# Patient Record
Sex: Female | Born: 1950 | Race: White | Hispanic: No | Marital: Married | Smoking: Current every day smoker
Health system: Southern US, Community
[De-identification: ages and names within clinical notes are randomized; demographics above are authoritative.]

## PROBLEM LIST (undated history)

## (undated) DIAGNOSIS — I10 Essential (primary) hypertension: Secondary | ICD-10-CM

## (undated) DIAGNOSIS — F1010 Alcohol abuse, uncomplicated: Secondary | ICD-10-CM

## (undated) DIAGNOSIS — E871 Hypo-osmolality and hyponatremia: Secondary | ICD-10-CM

## (undated) HISTORY — PX: ABDOMINAL HYSTERECTOMY: SHX81

---
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
## 2010-10-18 ENCOUNTER — Ambulatory Visit: Payer: Self-pay | Admitting: Internal Medicine

## 2011-04-02 ENCOUNTER — Ambulatory Visit: Payer: Self-pay | Admitting: Family Medicine

## 2012-02-25 ENCOUNTER — Ambulatory Visit: Payer: Self-pay | Admitting: Family Medicine

## 2019-01-26 ENCOUNTER — Other Ambulatory Visit: Payer: Self-pay

## 2019-01-26 ENCOUNTER — Inpatient Hospital Stay (HOSPITAL_COMMUNITY)
Admission: EM | Admit: 2019-01-26 | Discharge: 2019-01-30 | DRG: 641 | Disposition: A | Discharge status: Home / Self Care | Payer: Medicare Other | Source: Other Acute Inpatient Hospital | Attending: Internal Medicine | Admitting: Internal Medicine

## 2019-01-26 ENCOUNTER — Encounter: Payer: Self-pay | Admitting: Emergency Medicine

## 2019-01-26 ENCOUNTER — Inpatient Hospital Stay (HOSPITAL_COMMUNITY): Payer: Medicare Other

## 2019-01-26 DIAGNOSIS — F1010 Alcohol abuse, uncomplicated: Secondary | ICD-10-CM | POA: Diagnosis present

## 2019-01-26 DIAGNOSIS — E871 Hypo-osmolality and hyponatremia: Secondary | ICD-10-CM | POA: Diagnosis not present

## 2019-01-26 DIAGNOSIS — Z716 Tobacco abuse counseling: Secondary | ICD-10-CM

## 2019-01-26 DIAGNOSIS — I10 Essential (primary) hypertension: Secondary | ICD-10-CM | POA: Diagnosis present

## 2019-01-26 DIAGNOSIS — F17210 Nicotine dependence, cigarettes, uncomplicated: Secondary | ICD-10-CM | POA: Diagnosis not present

## 2019-01-26 DIAGNOSIS — F1020 Alcohol dependence, uncomplicated: Secondary | ICD-10-CM | POA: Diagnosis present

## 2019-01-26 DIAGNOSIS — Z885 Allergy status to narcotic agent status: Secondary | ICD-10-CM | POA: Diagnosis not present

## 2019-01-26 DIAGNOSIS — Z881 Allergy status to other antibiotic agents status: Secondary | ICD-10-CM | POA: Diagnosis not present

## 2019-01-26 DIAGNOSIS — Z20828 Contact with and (suspected) exposure to other viral communicable diseases: Secondary | ICD-10-CM | POA: Diagnosis present

## 2019-01-26 DIAGNOSIS — Z791 Long term (current) use of non-steroidal anti-inflammatories (NSAID): Secondary | ICD-10-CM | POA: Diagnosis not present

## 2019-01-26 DIAGNOSIS — R05 Cough: Secondary | ICD-10-CM | POA: Diagnosis present

## 2019-01-26 DIAGNOSIS — Z79899 Other long term (current) drug therapy: Secondary | ICD-10-CM | POA: Diagnosis not present

## 2019-01-26 DIAGNOSIS — G8929 Other chronic pain: Secondary | ICD-10-CM | POA: Diagnosis present

## 2019-01-26 DIAGNOSIS — F329 Major depressive disorder, single episode, unspecified: Secondary | ICD-10-CM | POA: Diagnosis present

## 2019-01-26 DIAGNOSIS — F419 Anxiety disorder, unspecified: Secondary | ICD-10-CM | POA: Diagnosis present

## 2019-01-26 DIAGNOSIS — R059 Cough, unspecified: Secondary | ICD-10-CM

## 2019-01-26 DIAGNOSIS — R531 Weakness: Secondary | ICD-10-CM | POA: Diagnosis present

## 2019-01-26 DIAGNOSIS — Z720 Tobacco use: Secondary | ICD-10-CM | POA: Diagnosis not present

## 2019-01-26 HISTORY — DX: Alcohol abuse, uncomplicated: F10.10

## 2019-01-26 HISTORY — DX: Hypo-osmolality and hyponatremia: E87.1

## 2019-01-26 HISTORY — DX: Essential (primary) hypertension: I10

## 2019-01-26 LAB — BASIC METABOLIC PANEL
Anion gap: 12 (ref 5–15)
BUN: 20 mg/dL (ref 8–23)
CO2: 28 mmol/L (ref 22–32)
Calcium: 9.9 mg/dL (ref 8.9–10.3)
Chloride: 85 mmol/L — ABNORMAL LOW (ref 98–111)
Creatinine, Ser: 0.69 mg/dL (ref 0.44–1.00)
GFR calc Af Amer: >60 mL/min (ref >60)
GFR calc non Af Amer: >60 mL/min (ref >60)
Glucose, Bld: 84 mg/dL (ref 70–99)
Potassium: 3.7 mmol/L (ref 3.5–5.1)
Sodium: 125 mmol/L — ABNORMAL LOW (ref 135–145)

## 2019-01-26 LAB — COMPREHENSIVE METABOLIC PANEL
ALT: 18 U/L (ref 0–44)
AST: 34 U/L (ref 15–41)
Albumin: 4.2 g/dL (ref 3.5–5.0)
Alkaline Phosphatase: 60 U/L (ref 38–126)
Anion gap: 13 (ref 5–15)
BUN: 21 mg/dL (ref 8–23)
CO2: 28 mmol/L (ref 22–32)
Calcium: 10.6 mg/dL — ABNORMAL HIGH (ref 8.9–10.3)
Chloride: 83 mmol/L — ABNORMAL LOW (ref 98–111)
Creatinine, Ser: 0.76 mg/dL (ref 0.44–1.00)
GFR calc Af Amer: >60 mL/min (ref >60)
GFR calc non Af Amer: >60 mL/min (ref >60)
Glucose, Bld: 72 mg/dL (ref 70–99)
Potassium: 3.9 mmol/L (ref 3.5–5.1)
Sodium: 124 mmol/L — ABNORMAL LOW (ref 135–145)
Total Bilirubin: 0.6 mg/dL (ref 0.3–1.2)
Total Protein: 7.2 g/dL (ref 6.5–8.1)

## 2019-01-26 LAB — CBC WITH DIFFERENTIAL/PLATELET
Abs Immature Granulocytes: 0.03 10*3/uL (ref 0.00–0.07)
Basophils Absolute: 0.1 10*3/uL (ref 0.0–0.1)
Basophils Relative: 1 %
Eosinophils Absolute: 0.2 10*3/uL (ref 0.0–0.5)
Eosinophils Relative: 2 %
HCT: 40.6 % (ref 36.0–46.0)
Hemoglobin: 14.8 g/dL (ref 12.0–15.0)
Immature Granulocytes: 0 %
Lymphocytes Relative: 17 %
Lymphs Abs: 1.3 10*3/uL (ref 0.7–4.0)
MCH: 36 pg — ABNORMAL HIGH (ref 26.0–34.0)
MCHC: 36.5 g/dL — ABNORMAL HIGH (ref 30.0–36.0)
MCV: 98.8 fL (ref 80.0–100.0)
Monocytes Absolute: 0.9 10*3/uL (ref 0.1–1.0)
Monocytes Relative: 12 %
Neutro Abs: 4.9 10*3/uL (ref 1.7–7.7)
Neutrophils Relative %: 68 %
Platelets: 252 10*3/uL (ref 150–400)
RBC: 4.11 MIL/uL (ref 3.87–5.11)
RDW: 12.2 % (ref 11.5–15.5)
WBC: 7.4 10*3/uL (ref 4.0–10.5)
nRBC: 0 % (ref 0.0–0.2)

## 2019-01-26 LAB — URINALYSIS, ROUTINE W REFLEX MICROSCOPIC
Bilirubin Urine: NEGATIVE
Glucose, UA: NEGATIVE mg/dL
Ketones, ur: NEGATIVE mg/dL
Leukocytes,Ua: NEGATIVE
Nitrite: NEGATIVE
Protein, ur: NEGATIVE mg/dL
Specific Gravity, Urine: 1.006 (ref 1.005–1.030)
pH: 7 (ref 5.0–8.0)

## 2019-01-26 LAB — SODIUM, URINE, RANDOM: Sodium, Ur: 61 mmol/L

## 2019-01-26 MED ORDER — HYDRALAZINE HCL 20 MG/ML IJ SOLN
5.0000 mg | Freq: Four times a day (QID) | INTRAMUSCULAR | Status: DC | PRN
  Administered 2019-01-29 (×2): 5 mg via INTRAVENOUS
  Filled 2019-01-26 (×2): qty 1

## 2019-01-26 MED ORDER — DICLOFENAC SODIUM 75 MG PO TBEC
75.0000 mg | DELAYED_RELEASE_TABLET | Freq: Two times a day (BID) | ORAL | Status: DC
  Administered 2019-01-27 – 2019-01-30 (×7): 75 mg via ORAL
  Filled 2019-01-26 (×9): qty 1

## 2019-01-26 MED ORDER — THIAMINE HCL 100 MG/ML IJ SOLN: 100.0000 mg | Freq: Every day | INTRAMUSCULAR | Status: DC

## 2019-01-26 MED ORDER — LORAZEPAM 2 MG/ML IJ SOLN
0.0000 mg | Freq: Two times a day (BID) | INTRAMUSCULAR | Status: DC
  Administered 2019-01-30: 1 mg via INTRAVENOUS
  Filled 2019-01-26: qty 1

## 2019-01-26 MED ORDER — TRAZODONE HCL 100 MG PO TABS
100.0000 mg | ORAL_TABLET | Freq: Every day | ORAL | Status: DC
  Administered 2019-01-26 – 2019-01-29 (×4): 100 mg via ORAL
  Filled 2019-01-26 (×4): qty 1

## 2019-01-26 MED ORDER — CHLORDIAZEPOXIDE HCL 25 MG PO CAPS
50.0000 mg | ORAL_CAPSULE | Freq: Once | ORAL | Status: AC
  Administered 2019-01-26: 50 mg via ORAL
  Filled 2019-01-26: qty 2

## 2019-01-26 MED ORDER — ACETAMINOPHEN 325 MG PO TABS
650.0000 mg | ORAL_TABLET | Freq: Four times a day (QID) | ORAL | Status: DC | PRN
  Administered 2019-01-27 – 2019-01-30 (×7): 650 mg via ORAL
  Filled 2019-01-26 (×7): qty 2

## 2019-01-26 MED ORDER — ENSURE ENLIVE PO LIQD
237.0000 mL | Freq: Two times a day (BID) | ORAL | Status: DC
  Administered 2019-01-27 – 2019-01-30 (×3): 237 mL via ORAL

## 2019-01-26 MED ORDER — ADULT MULTIVITAMIN W/MINERALS CH
1.0000 | ORAL_TABLET | Freq: Every day | ORAL | Status: DC
  Administered 2019-01-27 – 2019-01-30 (×4): 1 via ORAL
  Filled 2019-01-26 (×4): qty 1

## 2019-01-26 MED ORDER — CHLORDIAZEPOXIDE HCL 25 MG PO CAPS
25.0000 mg | ORAL_CAPSULE | Freq: Once | ORAL | Status: AC
  Administered 2019-01-27: 25 mg via ORAL
  Filled 2019-01-26: qty 1

## 2019-01-26 MED ORDER — ENOXAPARIN SODIUM 40 MG/0.4ML ~~LOC~~ SOLN
40.0000 mg | SUBCUTANEOUS | Status: DC
  Administered 2019-01-26 – 2019-01-29 (×4): 40 mg via SUBCUTANEOUS
  Filled 2019-01-26 (×4): qty 0.4

## 2019-01-26 MED ORDER — FOLIC ACID 1 MG PO TABS
1.0000 mg | ORAL_TABLET | Freq: Every day | ORAL | Status: DC
  Administered 2019-01-27 – 2019-01-30 (×4): 1 mg via ORAL
  Filled 2019-01-26 (×4): qty 1

## 2019-01-26 MED ORDER — LORAZEPAM 2 MG/ML IJ SOLN: 1.0000 mg | Freq: Four times a day (QID) | INTRAMUSCULAR | Status: AC | PRN

## 2019-01-26 MED ORDER — LORAZEPAM 2 MG/ML IJ SOLN: 0.0000 mg | Freq: Four times a day (QID) | INTRAMUSCULAR | Status: AC

## 2019-01-26 MED ORDER — VITAMIN B-1 100 MG PO TABS
100.0000 mg | ORAL_TABLET | Freq: Every day | ORAL | Status: DC
  Administered 2019-01-27 – 2019-01-30 (×4): 100 mg via ORAL
  Filled 2019-01-26 (×4): qty 1

## 2019-01-26 MED ORDER — GABAPENTIN 300 MG PO CAPS
600.0000 mg | ORAL_CAPSULE | Freq: Two times a day (BID) | ORAL | Status: DC
  Administered 2019-01-26 – 2019-01-30 (×8): 600 mg via ORAL
  Filled 2019-01-26 (×8): qty 2

## 2019-01-26 MED ORDER — ACETAMINOPHEN 650 MG RE SUPP: 650.0000 mg | Freq: Four times a day (QID) | RECTAL | Status: DC | PRN

## 2019-01-26 MED ORDER — LORAZEPAM 1 MG PO TABS
1.0000 mg | ORAL_TABLET | Freq: Four times a day (QID) | ORAL | Status: AC | PRN
  Administered 2019-01-28 – 2019-01-29 (×2): 1 mg via ORAL
  Filled 2019-01-26 (×2): qty 1

## 2019-01-26 MED ORDER — NICOTINE 14 MG/24HR TD PT24
14.0000 mg | MEDICATED_PATCH | TRANSDERMAL | Status: DC
  Administered 2019-01-26 – 2019-01-29 (×4): 14 mg via TRANSDERMAL
  Filled 2019-01-26 (×5): qty 1

## 2019-01-26 MED ORDER — LISINOPRIL 20 MG PO TABS
40.0000 mg | ORAL_TABLET | Freq: Every day | ORAL | Status: DC
  Administered 2019-01-26 – 2019-01-30 (×5): 40 mg via ORAL
  Filled 2019-01-26 (×5): qty 2

## 2019-01-26 MED ORDER — SERTRALINE HCL 100 MG PO TABS
100.0000 mg | ORAL_TABLET | Freq: Every day | ORAL | Status: DC
  Administered 2019-01-27 – 2019-01-30 (×4): 100 mg via ORAL
  Filled 2019-01-26 (×4): qty 1

## 2019-01-26 MED ORDER — SODIUM CHLORIDE 0.9 % IV SOLN
INTRAVENOUS | Status: AC
  Administered 2019-01-26 – 2019-01-27 (×2): via INTRAVENOUS

## 2019-01-26 NOTE — ED Notes (Signed)
Charge nurse Erline Levine made aware of [facility] concerns

## 2019-01-26 NOTE — H&P (Addendum)
TRH H&P    Patient Demographics:    Amy Becker, is a 68 y.o. female  MRN: [id number]  DOB - 1950-09-19  Admit Date - 01/26/2019  Referring MD/NP/PA:    Nat Christen  Outpatient Primary MD for the patient is Ventura Sellers, Dorinda Hill, MD  Patient coming from:   [facility]  Chief complaint-  hyponatremia   HPI:    Amy Becker  is a 68 y.o. female,w hypertension, etoh abuse, tobacco use. Has c/o generalized weakness and low sodium .  Pt sent from [facility] for low sodium.   In ED T 98 P 68  R 16  Bp 180/98  Pox 99%  Wt 52.6 kg  Wbc 7.4, Hgb 14.8, Plt 252 Na 124, K 3.9,  Bun 21, Creatinine 0.86  Ast 34, Alt 18 Alk phos 60 T. Bili 0.6  Pt will be admitted for hyponatremia.           Review of systems:    In addition to the HPI above,  No Fever-chills, No Headache, No changes with Vision or hearing, No problems swallowing food or Liquids, No Chest pain, No Shortness of Breath, No Abdominal pain, No Nausea or Vomiting, bowel movements are regular, No Blood in stool or Urine, No dysuria, No new skin rashes or bruises, No new joints pains-aches,  No new weakness, tingling, numbness in any extremity, No recent weight gain or loss, No polyuria, polydypsia or polyphagia, No significant Mental Stressors.  All other systems reviewed and are negative.    Past History of the following :    Past Medical History:  Diagnosis Date  . Alcohol abuse   . Hypertension   . Hyponatremia 01/26/2019      Past Surgical History:  Procedure Laterality Date  . ABDOMINAL HYSTERECTOMY        Social History:      Social History   Tobacco Use  . Smoking status: Current Every Day Smoker    Packs/day: 1.00    Years: 40.00    Pack years: 40.00    Types: Cigarettes  . Smokeless tobacco: Never Used  Substance Use Topics  . Alcohol use: Yes       Family History :     Family  History  Problem Relation Age of Onset  . CAD Mother       Home Medications:   Prior to Admission medications   Medication Sig Start Date End Date Taking? Authorizing Provider  diclofenac (VOLTAREN) 75 MG EC tablet Take 75 mg by mouth 2 (two) times daily with a meal.   Yes [provider]  gabapentin (NEURONTIN) 600 MG tablet Take 600 mg by mouth 2 (two) times a day.   Yes [provider]  LORazepam (ATIVAN) 1 MG tablet Take 1 mg by mouth daily as needed for anxiety.   Yes [provider]  sertraline (ZOLOFT) 100 MG tablet Take 100 mg by mouth daily.   Yes [provider]  traZODone (DESYREL) 100 MG tablet Take 100 mg by mouth at bedtime.  Yes [provider]     Allergies:     Allergies  Allergen Reactions  . Hydromorphone Hives  . Cefaclor Hives  . Tetracycline Rash     Physical Exam:   Vitals  Blood pressure (!) 145/74, pulse 77, temperature (!) 97.5 F (36.4 C), temperature source Oral, resp. rate 16, height 5' (1.524 m), weight 52.6 kg, SpO2 97 %.  1.  General: axox3 (person, place, time)  2. Psychiatric: euthymic  3. Neurologic: cn2-12 intact, reflexes 2+ symmetric, diffuse with no clonus, motor 5/5 in all 4 ext  4. HEENMT:  Anicteric, pupils 1.46m symmetric, direct, consensual  Neck: no jvd  5. Respiratory : CTAB  6. Cardiovascular : rrr s1, s2, no m/g/r  7. Gastrointestinal:  Abd: soft, nt, nd, +bs  8. Skin:  Ext: no c/c/e,  No rash  9.Musculoskeletal:  Good ROM  No adenopathy    Data Review:    CBC Recent Labs  Lab 01/26/19 1408  WBC 7.4  HGB 14.8  HCT 40.6  PLT 252  MCV 98.8  MCH 36.0*  MCHC 36.5*  RDW 12.2  LYMPHSABS 1.3  MONOABS 0.9  EOSABS 0.2  BASOSABS 0.1   ------------------------------------------------------------------------------------------------------------------  Results for orders placed or performed during the hospital encounter of 01/26/19 (from the past 48  hour(s))  CBC with Differential     Status: Abnormal   Collection Time: 01/26/19  2:08 PM  Result Value Ref Range   WBC 7.4 4.0 - 10.5 K/uL   RBC 4.11 3.87 - 5.11 MIL/uL   Hemoglobin 14.8 12.0 - 15.0 g/dL   HCT 40.6 36.0 - 46.0 %   MCV 98.8 80.0 - 100.0 fL   MCH 36.0 (H) 26.0 - 34.0 pg   MCHC 36.5 (H) 30.0 - 36.0 g/dL   RDW 12.2 11.5 - 15.5 %   Platelets 252 150 - 400 K/uL   nRBC 0.0 0.0 - 0.2 %   Neutrophils Relative % 68 %   Neutro Abs 4.9 1.7 - 7.7 K/uL   Lymphocytes Relative 17 %   Lymphs Abs 1.3 0.7 - 4.0 K/uL   Monocytes Relative 12 %   Monocytes Absolute 0.9 0.1 - 1.0 K/uL   Eosinophils Relative 2 %   Eosinophils Absolute 0.2 0.0 - 0.5 K/uL   Basophils Relative 1 %   Basophils Absolute 0.1 0.0 - 0.1 K/uL   Immature Granulocytes 0 %   Abs Immature Granulocytes 0.03 0.00 - 0.07 K/uL    Comment: Performed [facility] [address] [place] [id number] Comprehensive metabolic panel     Status: Abnormal   Collection Time: 01/26/19  2:08 PM  Result Value Ref Range   Sodium 124 (L) 135 - 145 mmol/L   Potassium 3.9 3.5 - 5.1 mmol/L   Chloride 83 (L) 98 - 111 mmol/L   CO2 28 22 - 32 mmol/L   Glucose, Bld 72 70 - 99 mg/dL   BUN 21 8 - 23 mg/dL   Creatinine, Ser 0.76 0.44 - 1.00 mg/dL   Calcium 10.6 (H) 8.9 - 10.3 mg/dL   Total Protein 7.2 6.5 - 8.1 g/dL   Albumin 4.2 3.5 - 5.0 g/dL   AST 34 15 - 41 U/L   ALT 18 0 - 44 U/L   Alkaline Phosphatase 60 38 - 126 U/L   Total Bilirubin 0.6 0.3 - 1.2 mg/dL   GFR calc non Af Amer >60 >60 mL/min   GFR calc Af Amer >60 >60  mL/min   Anion gap 13 5 - 15    Comment: Performed at [facility], [place] [address] [place], [place] [address]    Chemistries  Recent Labs  Lab 01/26/19 1408  NA 124*  K 3.9  CL 83*  CO2 28  GLUCOSE 72  BUN 21  CREATININE 0.76  CALCIUM 10.6*  AST 34  ALT 18  ALKPHOS 60  BILITOT 0.6    ------------------------------------------------------------------------------------------------------------------  ------------------------------------------------------------------------------------------------------------------ GFR: Estimated Creatinine Clearance: 47.7 mL/min (by C-G formula based on SCr of 0.76 mg/dL). Liver Function Tests: Recent Labs  Lab 01/26/19 1408  AST 34  ALT 18  ALKPHOS 60  BILITOT 0.6  PROT 7.2  ALBUMIN 4.2   No results for input(s): LIPASE, AMYLASE in the last 168 hours. No results for input(s): AMMONIA in the last 168 hours. Coagulation Profile: No results for input(s): INR, PROTIME in the last 168 hours. Cardiac Enzymes: No results for input(s): CKTOTAL, CKMB, CKMBINDEX, TROPONINI in the last 168 hours. BNP (last 3 results) No results for input(s): PROBNP in the last 8760 hours. HbA1C: No results for input(s): HGBA1C in the last 72 hours. CBG: No results for input(s): GLUCAP in the last 168 hours. Lipid Profile: No results for input(s): CHOL, HDL, LDLCALC, TRIG, CHOLHDL, LDLDIRECT in the last 72 hours. Thyroid Function Tests: No results for input(s): TSH, T4TOTAL, FREET4, T3FREE, THYROIDAB in the last 72 hours. Anemia Panel: No results for input(s): VITAMINB12, FOLATE, FERRITIN, TIBC, IRON, RETICCTPCT in the last 72 hours.  --------------------------------------------------------------------------------------------------------------- Urine analysis: No results found for: COLORURINE, APPEARANCEUR, LABSPEC, PHURINE, GLUCOSEU, HGBUR, BILIRUBINUR, KETONESUR, PROTEINUR, UROBILINOGEN, NITRITE, LEUKOCYTESUR    Imaging Results:    No results found.     Assessment & Plan:    Principal Problem:   Hyponatremia Active Problems:   ETOH abuse   Tobacco abuse   Hyponatremia Check serum osm, tsh, cortisol Urine sodium, urine osm Hydrate gently with ns at 56m per hour  Check bmp at 2300 and cmp in am  ETOH dep Librium '50mg'$  po x qday x1  day then '25mg'$  po qday x 1 day CIWA  Tobacco use Nicotine patch '14mg'$  topically qday Pt counselled on smoking cessation x 348mutes  Hypertension Cont lisinopril '40mg'$  po qday Hydralazine '5mg'$  iv q6h prn   Slight dry cough (dry) Check CXR Please quit smoking  Anxiety/ Depression Continue Zoloft and Trazodone  ? Chronic pain Cont diclofenac and gabapentin  DVT Prophylaxis-   Lovenox - SCDs   AM Labs Ordered, also please review Full Orders  Family Communication: Admission, patients condition and plan of care including tests being ordered have been discussed with the patient  who indicate understanding and agree with the plan and Code Status.  Code Status:  FULL CODE  Admission status: Inpatient: Based on patients clinical presentation and evaluation of above clinical data, I have made determination that patient meets Inpatient criteria at this time.  Pt has high possiblity of clinical deterioration. Pt has high risk of ETOH withdrawal, stopped alcohol use 3-4 days ago.  Pt also will require ivf to correct sodium and this will likely take > 2 days. And thus require inpatient stay.   Time spent in minutes : 55 minutes   JaJani Gravel.D on 01/26/2019 at 7:58 PM

## 2019-01-26 NOTE — ED Notes (Signed)
sandwich and fluids provided per pt request.

## 2019-01-26 NOTE — ED Triage Notes (Signed)
Pt to ED via EMS from [facility] receiving detox [facility]. Pt has been at facility for 4 days, Pt to ED d/t low NA, Sodium 122. Pt not voicing any complaints. Orientation at baseline.

## 2019-01-26 NOTE — ED Provider Notes (Addendum)
[facility]-EMERGENCY DEPT Provider Note   CSN: [id number] Arrival date & time: 01/26/19  1255    History   Chief Complaint Chief Complaint  Patient presents with  . Abnormal Lab    Sodium 122    HPI Amy Becker is a 68 y.o. female.     [facility]  Basic blood work was obtained which revealed a sodium of 122.  Patient was sent to the emergency department.  She reports "low potassium" several years ago.  She is on benzodiazepines but no other new medication.  She reports no somatic complaints other than weakness.[name].  Specifically, no chest pain, dyspnea, fever, sweats, chills, dysuria.  [facility] states she is very weak and having trouble ambulating.     Past Medical History:  Diagnosis Date  . Alcohol abuse   . Hypertension     There are no active problems to display for this patient.   Past Surgical History:  Procedure Laterality Date  . ABDOMINAL HYSTERECTOMY       OB History   No obstetric history on file.      Home Medications    Prior to Admission medications   Not on File    Family History History reviewed. No pertinent family history.  Social History Social History   Tobacco Use  . Smoking status: Current Every Day Smoker    Packs/day: 1.00    Years: 40.00    Pack years: 40.00    Types: Cigarettes  Substance Use Topics  . Alcohol use: Yes  . Drug use: Not on file     Allergies   Hydromorphone, Cefaclor, and Tetracycline   Review of Systems Review of Systems  All other systems reviewed and are negative.    Physical Exam Updated Vital Signs BP (!) 191/98   Pulse 66   Temp 98 F (36.7 C) (Oral)   Resp 16   Ht 5' (1.524 m)   Wt 52.6 kg   SpO2 97%   BMI 22.65 kg/m   Physical Exam Vitals signs and nursing note reviewed.  Constitutional:      Appearance: She is well-developed.  HENT:     Head: Normocephalic and  atraumatic.  Eyes:     Conjunctiva/sclera: Conjunctivae normal.  Neck:     Musculoskeletal: Neck supple.  Cardiovascular:     Rate and Rhythm: Normal rate and regular rhythm.  Pulmonary:     Effort: Pulmonary effort is normal.     Breath sounds: Normal breath sounds.  Abdominal:     General: Bowel sounds are normal.     Palpations: Abdomen is soft.  Musculoskeletal: Normal range of motion.  Skin:    General: Skin is warm and dry.  Neurological:     Mental Status: She is alert and oriented to person, place, and time.  Psychiatric:        Behavior: Behavior normal.      ED Treatments / Results  Labs (all labs ordered are listed, but only abnormal results are displayed) Labs Reviewed  CBC WITH DIFFERENTIAL/PLATELET - Abnormal; Notable for the following components:      Result Value   MCH 36.0 (*)    MCHC 36.5 (*)    All other components within normal limits  COMPREHENSIVE METABOLIC PANEL - Abnormal; Notable for the following components:   Sodium 124 (*)    Chloride 83 (*)    Calcium 10.6 (*)  All other components within normal limits    EKG None  Radiology No results found.  Procedures Procedures (including critical care time)  Medications Ordered in ED Medications - No data to display   Initial Impression / Assessment and Plan / ED Course  I have reviewed the triage vital signs and the nursing notes.  Pertinent labs & imaging results that were available during my care of the patient were reviewed by me and considered in my medical decision making (see chart for details).       Sodium 124.  She appears slightly confused.  Patient has had trouble ambulating independently.  She is weak and deconditioned.  Will admit for further evaluation.  Discussed with Dr. Maudie Mercury.  Additionally I recalled the husband and updated him on his wife's status.   Final Clinical Impressions(s) / ED Diagnoses   Final diagnoses:  Hyponatremia    ED Discharge Orders    None        Nat Christen, MD 01/26/19 1349    Nat Christen, MD 01/26/19 1727    Nat Christen, MD 01/26/19 Virl Cagey    Nat Christen, MD 01/26/19 [phone]

## 2019-01-26 NOTE — ED Notes (Signed)
ED TO INPATIENT HANDOFF REPORT  Name/Age/Gender Amy Becker 68 y.o. female  Code Status    Code Status Orders  (From admission, onward)         Start     Ordered   01/26/19 1858  Full code  Continuous     01/26/19 1859        Code Status History    This patient has a current code status but no historical code status.   Advance Care Planning Activity    Advance Directive Documentation     Most Recent Value  Type of Advance Directive  Healthcare Power of Attorney  Pre-existing out of facility DNR order (yellow form or pink MOST form)  -  "MOST" Form in Place?  -      Home/SNF/Other [facility]   Chief Complaint LOW SODIUM  Level of Care/Admitting Diagnosis ED Disposition    ED Disposition Condition [place]: [place] [[id number]]  Level of Care: Med-Surg [16]  Covid Evaluation: Asymptomatic Screening Protocol (No Symptoms)  Diagnosis: Hyponatremia [161096]  Admitting Physician: Jani Gravel [[id number]]  Attending Physician: Jani Gravel [phone]  Estimated length of stay: past midnight tomorrow  Certification:: I certify this patient will need inpatient services for at least 2 midnights  PT Class (Do Not Modify): Inpatient [101]  PT Acc Code (Do Not Modify): Private [1]       Medical History Past Medical History:  Diagnosis Date  . Alcohol abuse   . Hypertension   . Hyponatremia 01/26/2019    Allergies Allergies  Allergen Reactions  . Hydromorphone Hives  . Cefaclor Hives  . Tetracycline Rash    IV Location/Drains/Wounds Patient Lines/Drains/Airways Status   Active Line/Drains/Airways    None          Labs/Imaging Results for orders placed or performed during the hospital encounter of 01/26/19 (from the past 48 hour(s))  CBC with Differential     Status: Abnormal   Collection Time: 01/26/19  2:08 PM  Result Value Ref Range   WBC 7.4 4.0 - 10.5 K/uL   RBC 4.11 3.87 - 5.11 MIL/uL   Hemoglobin 14.8  12.0 - 15.0 g/dL   HCT 40.6 36.0 - 46.0 %   MCV 98.8 80.0 - 100.0 fL   MCH 36.0 (H) 26.0 - 34.0 pg   MCHC 36.5 (H) 30.0 - 36.0 g/dL   RDW 12.2 11.5 - 15.5 %   Platelets 252 150 - 400 K/uL   nRBC 0.0 0.0 - 0.2 %   Neutrophils Relative % 68 %   Neutro Abs 4.9 1.7 - 7.7 K/uL   Lymphocytes Relative 17 %   Lymphs Abs 1.3 0.7 - 4.0 K/uL   Monocytes Relative 12 %   Monocytes Absolute 0.9 0.1 - 1.0 K/uL   Eosinophils Relative 2 %   Eosinophils Absolute 0.2 0.0 - 0.5 K/uL   Basophils Relative 1 %   Basophils Absolute 0.1 0.0 - 0.1 K/uL   Immature Granulocytes 0 %   Abs Immature Granulocytes 0.03 0.00 - 0.07 K/uL    Comment: Performed at [facility], [place] [address], [place], [place] [address]  Comprehensive metabolic panel     Status: Abnormal   Collection Time: 01/26/19  2:08 PM  Result Value Ref Range   Sodium 124 (L) 135 - 145 mmol/L   Potassium 3.9 3.5 - 5.1 mmol/L   Chloride 83 (L) 98 - 111 mmol/L   CO2 28 22 -  32 mmol/L   Glucose, Bld 72 70 - 99 mg/dL   BUN 21 8 - 23 mg/dL   Creatinine, Ser 6.570.76 0.44 - 1.00 mg/dL   Calcium 84.610.6 (H) 8.9 - 10.3 mg/dL   Total Protein 7.2 6.5 - 8.1 g/dL   Albumin 4.2 3.5 - 5.0 g/dL   AST 34 15 - 41 U/L   ALT 18 0 - 44 U/L   Alkaline Phosphatase 60 38 - 126 U/L   Total Bilirubin 0.6 0.3 - 1.2 mg/dL   GFR calc non Af Amer >60 >60 mL/min   GFR calc Af Amer >60 >60 mL/min   Anion gap 13 5 - 15    Comment: Performed at [facility], [address] [place], [place] [address]   Dg Chest Port 1 View  Result Date: 01/26/2019 CLINICAL DATA:  Cough. Hyponatremia. EXAM: PORTABLE CHEST 1 VIEW COMPARISON:  None. FINDINGS: Mild rotation. Heart size upper normal with normal mediastinal contours. Aortic atherosclerosis. Pulmonary vasculature is normal. No consolidation, pleural effusion, or pneumothorax. Skin fold projects over the left upper chest wall. No acute osseous abnormalities are seen. IMPRESSION: No acute chest  findings. Aortic Atherosclerosis (ICD10-I70.0). Electronically Signed   By: Narda RutherfordMelanie  Sanford M.D.   On: 01/26/2019 20:04    Pending Labs Unresulted Labs (From admission, onward)    Start     Ordered   02/02/19 0500  Creatinine, serum  (enoxaparin (LOVENOX)    CrCl >/= 30 ml/min)  Weekly,   R    Comments: while on enoxaparin therapy    01/26/19 1859   01/27/19 0500  Comprehensive metabolic panel  Tomorrow morning,   R     01/26/19 1859   01/27/19 0500  CBC  Tomorrow morning,   R     01/26/19 1859   01/26/19 2300  Basic metabolic panel  Once,   STAT     01/26/19 1859   01/26/19 2015  Hepatitis C antibody  Add-on,   AD     01/26/19 2014   01/26/19 1936  Urinalysis, Routine w reflex microscopic  Once,   STAT     01/26/19 1935   01/26/19 1935  Novel Coronavirus,NAA,(SEND-OUT TO REF LAB - TAT 24-48 hrs); Hosp Order  (Asymptomatic Patients Labs)  ONCE - STAT,   STAT    Question:  Rule Out  Answer:  Yes   01/26/19 1934   01/26/19 1934  Osmolality  Add-on,   AD     01/26/19 1933   01/26/19 1934  Cortisol  Add-on,   AD     01/26/19 1933   01/26/19 1934  TSH  Add-on,   AD     01/26/19 1933   01/26/19 1934  Osmolality, urine  Once,   STAT     01/26/19 1933   01/26/19 1934  Sodium, urine, random  Once,   STAT     01/26/19 1933   01/26/19 1857  HIV antibody (Routine Testing)  Once,   STAT     01/26/19 1859   01/26/19 1857  Creatinine, serum  (enoxaparin (LOVENOX)    CrCl >/= 30 ml/min)  Once,   STAT    Comments: Baseline for enoxaparin therapy IF NOT ALREADY DRAWN.    01/26/19 1859          Vitals/Pain Today's Vitals   01/26/19 1744 01/26/19 1834 01/26/19 1845 01/26/19 1900  BP:  (!) 158/77  (!) 145/74  Pulse:  72 77   Resp:    16  Temp:      TempSrc:      SpO2:  98% 97%   Weight:      Height:      PainSc: 0-No pain       Isolation Precautions No active isolations  Medications Medications  enoxaparin (LOVENOX) injection 40 mg (has no administration in time range)   0.9 %  sodium chloride infusion (has no administration in time range)  acetaminophen (TYLENOL) tablet 650 mg (has no administration in time range)    Or  acetaminophen (TYLENOL) suppository 650 mg (has no administration in time range)  LORazepam (ATIVAN) tablet 1 mg (has no administration in time range)    Or  LORazepam (ATIVAN) injection 1 mg (has no administration in time range)  thiamine (VITAMIN B-1) tablet 100 mg (has no administration in time range)    Or  thiamine (B-1) injection 100 mg (has no administration in time range)  folic acid (FOLVITE) tablet 1 mg (has no administration in time range)  multivitamin with minerals tablet 1 tablet (has no administration in time range)  LORazepam (ATIVAN) injection 0-4 mg (has no administration in time range)    Followed by  LORazepam (ATIVAN) injection 0-4 mg (has no administration in time range)  chlordiazePOXIDE (LIBRIUM) capsule 50 mg (has no administration in time range)  chlordiazePOXIDE (LIBRIUM) capsule 25 mg (has no administration in time range)  nicotine (NICODERM CQ - dosed in mg/24 hours) patch 14 mg (has no administration in time range)  feeding supplement (ENSURE ENLIVE) (ENSURE ENLIVE) liquid 237 mL (has no administration in time range)  lisinopril (ZESTRIL) tablet 40 mg (has no administration in time range)  hydrALAZINE (APRESOLINE) injection 5 mg (has no administration in time range)    Mobility walks with person assist

## 2019-01-26 NOTE — ED Notes (Signed)
EDP at bedside  

## 2019-01-26 NOTE — ED Notes (Signed)
Call received from pt husband Vangie Henthorn [phone] requesting rtn call w/pt status when possible. RN advised. [employer]

## 2019-01-26 NOTE — ED Notes (Signed)
Patient walking without difficulty into lobby-patient states she was going out to smoke a cigarette-informed patient this was a nonsmoking faciltiy

## 2019-01-26 NOTE — ED Notes (Signed)
This nurse called [facility] r/t patient being discharged and discharge ride home, informed nursing staff will call back,.

## 2019-01-26 NOTE — ED Notes (Signed)
MD Lacinda Axon at bedside d/t concern that pt appears to be more weak and disoriented. Pt reports she "slid out of bed" Denies this was a fall, denies pain. RN did not witness Lacinda Axon MD aware and charge nurse Stacy aware. Per Lacinda Axon MD- he is going to admit pt to the hospital. This nurse notified [facility] and spoke with [facility] that pt was not being discharged at this time.

## 2019-01-26 NOTE — Discharge Instructions (Addendum)
Sodium today was 124.  Recommend added salt to your diet.  Fluid restrict.  Recommend repeat sodium check in 1 week.  If symptoms persist, can follow-up with your primary care doctor at [facility] for a full work-up.

## 2019-01-26 NOTE — ED Notes (Signed)
Spoke with [facility], expressing concerns r/t patient being discharged. Requesting to speak to EDP. EDP Cook given message.

## 2019-01-27 DIAGNOSIS — E871 Hypo-osmolality and hyponatremia: Principal | ICD-10-CM

## 2019-01-27 LAB — CBC
HCT: 37.1 % (ref 36.0–46.0)
Hemoglobin: 13 g/dL (ref 12.0–15.0)
MCH: 35.4 pg — ABNORMAL HIGH (ref 26.0–34.0)
MCHC: 35 g/dL (ref 30.0–36.0)
MCV: 101.1 fL — ABNORMAL HIGH (ref 80.0–100.0)
Platelets: 189 10*3/uL (ref 150–400)
RBC: 3.67 MIL/uL — ABNORMAL LOW (ref 3.87–5.11)
RDW: 11.9 % (ref 11.5–15.5)
WBC: 7.8 10*3/uL (ref 4.0–10.5)
nRBC: 0 % (ref 0.0–0.2)

## 2019-01-27 LAB — COMPREHENSIVE METABOLIC PANEL
ALT: 14 U/L (ref 0–44)
AST: 25 U/L (ref 15–41)
Albumin: 3.5 g/dL (ref 3.5–5.0)
Alkaline Phosphatase: 50 U/L (ref 38–126)
Anion gap: 11 (ref 5–15)
BUN: 18 mg/dL (ref 8–23)
CO2: 24 mmol/L (ref 22–32)
Calcium: 9.4 mg/dL (ref 8.9–10.3)
Chloride: 92 mmol/L — ABNORMAL LOW (ref 98–111)
Creatinine, Ser: 0.62 mg/dL (ref 0.44–1.00)
GFR calc Af Amer: >60 mL/min (ref >60)
GFR calc non Af Amer: >60 mL/min (ref >60)
Glucose, Bld: 80 mg/dL (ref 70–99)
Potassium: 3.6 mmol/L (ref 3.5–5.1)
Sodium: 127 mmol/L — ABNORMAL LOW (ref 135–145)
Total Bilirubin: 0.3 mg/dL (ref 0.3–1.2)
Total Protein: 5.5 g/dL — ABNORMAL LOW (ref 6.5–8.1)

## 2019-01-27 LAB — CORTISOL: Cortisol, Plasma: 7.5 ug/dL

## 2019-01-27 LAB — OSMOLALITY, URINE: Osmolality, Ur: 289 mOsm/kg — ABNORMAL LOW (ref 300–900)

## 2019-01-27 LAB — TSH: TSH: 3.041 u[IU]/mL (ref 0.350–4.500)

## 2019-01-27 LAB — HIV ANTIBODY (ROUTINE TESTING W REFLEX): HIV Screen 4th Generation wRfx: NONREACTIVE

## 2019-01-27 LAB — OSMOLALITY: Osmolality: 260 mOsm/kg — ABNORMAL LOW (ref 275–295)

## 2019-01-27 MED ORDER — POLYETHYLENE GLYCOL 3350 17 G PO PACK: 17.0000 g | PACK | Freq: Every day | ORAL | Status: DC | PRN

## 2019-01-27 MED ORDER — DOCUSATE SODIUM 100 MG PO CAPS
100.0000 mg | ORAL_CAPSULE | Freq: Two times a day (BID) | ORAL | Status: DC
  Administered 2019-01-27 – 2019-01-30 (×2): 100 mg via ORAL
  Filled 2019-01-27 (×4): qty 1

## 2019-01-27 NOTE — Progress Notes (Signed)
[name].  PROGRESS NOTE    Amy Becker  ZOX:[id number] DOB: 02/01/1951 DOA: 01/26/2019 PCP: Orson Aloelough, Jeffrey David, MD   Brief Narrative:    Amy Becker  is a 68 y.o. female,w hypertension, etoh abuse, tobacco use. Has c/o generalized weakness and low sodium .  [facility] sent from [facility] sodium.    Assessment & Plan:   Principal Problem:   Hyponatremia Active Problems:   ETOH abuse   Tobacco abuse   Hyponatremia     - low serum and urine osm; high urine Na+     - TSH ok     - AM cortisol pending     - responding to fluids     - Na+ is 128 today  ETOH dep     - reports a 1/2-bottle of wine daily habit; last drink 7/5; she checked herself into rehab [facility]     - continue librium taper     - CIWA  Tobacco use     - Nicotine patch 14mg  topically qday     - Pt counselled on smoking cessation x 3minutes  Hypertension     - Cont lisinopril 40mg  po qday     - Hydralazine 5mg  iv q6h prn   Slight dry cough (dry)     - CXR negative     - Please quit smoking  Anxiety/ Depression     - Continue Zoloft and Trazodone; question if these are playing a roll in her hyponatremia  ? Chronic pain     - Cont diclofenac and gabapentin  Na+ improving. Continue fluids. Needs AM cortisol check. Question is SSRI playing a roll in HypoNa. Likely d/c back to rehab in AM. COVID screen results pending.   DVT prophylaxis: lovenox Code Status: FULL   Disposition Plan: To [facility] when stable  Subjective: "Why is this happening?"  Objective: Vitals:   01/26/19 1900 01/26/19 2000 01/26/19 2046 01/27/19 0405  BP: (!) 145/74 (!) 154/89 (!) 160/85 (!) 147/77  Pulse:  68 76 67  Resp: 16 17 16 16   Temp:   98 F (36.7 C) 97.9 F (36.6 C)  TempSrc:   Oral Oral  SpO2:  100% 99% 97%  Weight:      Height:        Intake/Output Summary (Last 24 hours) at 01/27/2019 1318 Last data filed at 01/27/2019 1151 Gross per 24 hour  Intake 755.23 ml  Output 2400 ml   Net -1644.77 ml   Filed Weights   01/26/19 1311  Weight: 52.6 kg    Examination:  General: 68 y.o. female resting in bed in NAD Cardiovascular: RRR, +S1, S2, no m/g/r, equal pulses throughout Respiratory: CTABL, no w/r/r, normal WOB GI: BS+, NDNT, no masses noted, no organomegaly noted MSK: No e/c/c Skin: No rashes, bruises, ulcerations noted Neuro: A&O x 3, no focal deficits Psyc: Appropriate interaction and affect, calm/cooperative     Data Reviewed: I have personally reviewed following labs and imaging studies.  CBC: Recent Labs  Lab 01/26/19 1408 01/27/19 0550  WBC 7.4 7.8  NEUTROABS 4.9  --   HGB 14.8 13.0  HCT 40.6 37.1  MCV 98.8 101.1*  PLT 252 189   Basic Metabolic Panel: Recent Labs  Lab 01/26/19 1408 01/26/19 2255 01/27/19 0550  NA 124* 125* 127*  K 3.9 3.7 3.6  CL 83* 85* 92*  CO2 28 28 24   GLUCOSE 72 84 80  BUN 21 20 18   CREATININE 0.76 0.69  0.62  CALCIUM 10.6* 9.9 9.4   GFR: Estimated Creatinine Clearance: 47.7 mL/min (by C-G formula based on SCr of 0.62 mg/dL). Liver Function Tests: Recent Labs  Lab 01/26/19 1408 01/27/19 0550  AST 34 25  ALT 18 14  ALKPHOS 60 50  BILITOT 0.6 0.3  PROT 7.2 5.5*  ALBUMIN 4.2 3.5   No results for input(s): LIPASE, AMYLASE in the last 168 hours. No results for input(s): AMMONIA in the last 168 hours. Coagulation Profile: No results for input(s): INR, PROTIME in the last 168 hours. Cardiac Enzymes: No results for input(s): CKTOTAL, CKMB, CKMBINDEX, TROPONINI in the last 168 hours. BNP (last 3 results) No results for input(s): PROBNP in the last 8760 hours. HbA1C: No results for input(s): HGBA1C in the last 72 hours. CBG: No results for input(s): GLUCAP in the last 168 hours. Lipid Profile: No results for input(s): CHOL, HDL, LDLCALC, TRIG, CHOLHDL, LDLDIRECT in the last 72 hours. Thyroid Function Tests: Recent Labs    01/26/19 2255  TSH 3.041   Anemia Panel: No results for input(s):  VITAMINB12, FOLATE, FERRITIN, TIBC, IRON, RETICCTPCT in the last 72 hours. Sepsis Labs: No results for input(s): PROCALCITON, LATICACIDVEN in the last 168 hours.  No results found for this or any previous visit (from the past 240 hour(s)).    Radiology Studies: Dg Chest Port 1 View  Result Date: 01/26/2019 CLINICAL DATA:  Cough. Hyponatremia. EXAM: PORTABLE CHEST 1 VIEW COMPARISON:  None. FINDINGS: Mild rotation. Heart size upper normal with normal mediastinal contours. Aortic atherosclerosis. Pulmonary vasculature is normal. No consolidation, pleural effusion, or pneumothorax. Skin fold projects over the left upper chest wall. No acute osseous abnormalities are seen. IMPRESSION: No acute chest findings. Aortic Atherosclerosis (ICD10-I70.0). Electronically Signed   By: Keith Rake M.D.   On: 01/26/2019 20:04    Scheduled Meds: . chlordiazePOXIDE  25 mg Oral Once  . diclofenac  75 mg Oral BID WC  . enoxaparin (LOVENOX) injection  40 mg Subcutaneous Q24H  . feeding supplement (ENSURE ENLIVE)  237 mL Oral BID BM  . folic acid  1 mg Oral Daily  . gabapentin  600 mg Oral BID  . lisinopril  40 mg Oral Daily  . LORazepam  0-4 mg Intravenous Q6H   Followed by  . [START ON 01/28/2019] LORazepam  0-4 mg Intravenous Q12H  . multivitamin with minerals  1 tablet Oral Daily  . nicotine  14 mg Transdermal Q24H  . sertraline  100 mg Oral Daily  . thiamine  100 mg Oral Daily   Or  . thiamine  100 mg Intravenous Daily  . traZODone  100 mg Oral QHS   Continuous Infusions: . sodium chloride 60 mL/hr at 01/27/19 1236     LOS: 1 day    Time spent: 25 minutes spent in the coordination of care today.     Jonnie Finner, DO [facility] Pager [phone]  If 7PM-7AM, please contact night-coverage [email or web address] Password [email or web address] 01/27/2019, 1:18 PM

## 2019-01-27 NOTE — Progress Notes (Signed)
Initial Nutrition Assessment  INTERVENTION:   Continue Ensure Enlive po BID, each supplement provides 350 kcal and 20 grams of protein  NUTRITION DIAGNOSIS:   Increased nutrient needs related to (ETOH abuse) as evidenced by estimated needs.  GOAL:   Patient will meet greater than or equal to 90% of their needs  MONITOR:   PO intake, Supplement acceptance, Labs, Weight trends, I & O's  REASON FOR ASSESSMENT:   Consult, Malnutrition Screening Tool Assessment of nutrition requirement/status  ASSESSMENT:   68 y.o. female,w hypertension, etoh abuse, tobacco use. Has c/o generalized weakness and low sodium .  Pt sent from [place] for low sodium.  **RD working remotely**  RD unable to reach pt by phone. Per chart review, pt has been drinking 1/2 bottle of wine daily. Last drink was 3-4 days PTA. Suspect PO intake was not ideal given ETOH abuse. No PO documented at this time. Ensure supplements have been ordered.  Per weight records, pt has had no weight loss. Last recorded weight PTA was 117 lbs in August 2019. This is consistent with current weight.  Medications: folic acid tablet daily, Multivitamin with minerals daily, Thiamine tablet daily Labs reviewed:  Low Na  NUTRITION - FOCUSED PHYSICAL EXAM:  Unable to perform -working remotely.  Diet Order:   Diet Order            Diet regular Room service appropriate? Yes; Fluid consistency: Thin  Diet effective now              EDUCATION NEEDS:   No education needs have been identified at this time  Skin:  Skin Assessment: Reviewed RN Assessment  Last BM:  7/7  Height:   Ht Readings from Last 1 Encounters:  01/26/19 5' (1.524 m)    Weight:   Wt Readings from Last 1 Encounters:  01/26/19 52.6 kg    Ideal Body Weight:  45.4 kg  BMI:  Body mass index is 22.65 kg/m.  Estimated Nutritional Needs:   Kcal:  1500-1750  Protein:  70-80g  Fluid:  1.7L/day  Clayton Bibles, MS, RD, LDN [place] [facility] Pager: [phone] After Hours Pager: [phone]

## 2019-01-28 LAB — CBC WITH DIFFERENTIAL/PLATELET
Abs Immature Granulocytes: 0.04 10*3/uL (ref 0.00–0.07)
Basophils Absolute: 0.1 10*3/uL (ref 0.0–0.1)
Basophils Relative: 1 %
Eosinophils Absolute: 0.3 10*3/uL (ref 0.0–0.5)
Eosinophils Relative: 3 %
HCT: 37.8 % (ref 36.0–46.0)
Hemoglobin: 12.7 g/dL (ref 12.0–15.0)
Immature Granulocytes: 1 %
Lymphocytes Relative: 13 %
Lymphs Abs: 1 10*3/uL (ref 0.7–4.0)
MCH: 34.9 pg — ABNORMAL HIGH (ref 26.0–34.0)
MCHC: 33.6 g/dL (ref 30.0–36.0)
MCV: 103.8 fL — ABNORMAL HIGH (ref 80.0–100.0)
Monocytes Absolute: 0.9 10*3/uL (ref 0.1–1.0)
Monocytes Relative: 12 %
Neutro Abs: 5.5 10*3/uL (ref 1.7–7.7)
Neutrophils Relative %: 70 %
Platelets: 218 10*3/uL (ref 150–400)
RBC: 3.64 MIL/uL — ABNORMAL LOW (ref 3.87–5.11)
RDW: 12.2 % (ref 11.5–15.5)
WBC: 7.8 10*3/uL (ref 4.0–10.5)
nRBC: 0 % (ref 0.0–0.2)

## 2019-01-28 LAB — RENAL FUNCTION PANEL
Albumin: 3.5 g/dL (ref 3.5–5.0)
Anion gap: 10 (ref 5–15)
BUN: 13 mg/dL (ref 8–23)
CO2: 23 mmol/L (ref 22–32)
Calcium: 8.7 mg/dL — ABNORMAL LOW (ref 8.9–10.3)
Chloride: 95 mmol/L — ABNORMAL LOW (ref 98–111)
Creatinine, Ser: 0.52 mg/dL (ref 0.44–1.00)
GFR calc Af Amer: >60 mL/min (ref >60)
GFR calc non Af Amer: >60 mL/min (ref >60)
Glucose, Bld: 89 mg/dL (ref 70–99)
Phosphorus: 3.5 mg/dL (ref 2.5–4.6)
Potassium: 4.3 mmol/L (ref 3.5–5.1)
Sodium: 128 mmol/L — ABNORMAL LOW (ref 135–145)

## 2019-01-28 LAB — NOVEL CORONAVIRUS, NAA (HOSP ORDER, SEND-OUT TO REF LAB; TAT 18-24 HRS): SARS-CoV-2, NAA: NOT DETECTED

## 2019-01-28 LAB — HEPATITIS C ANTIBODY: HCV Ab: 0.1 s/co ratio (ref 0.0–0.9)

## 2019-01-28 LAB — CORTISOL-AM, BLOOD: Cortisol - AM: 8.9 ug/dL (ref 6.7–22.6)

## 2019-01-28 LAB — MAGNESIUM: Magnesium: 1.9 mg/dL (ref 1.7–2.4)

## 2019-01-28 MED ORDER — SODIUM CHLORIDE 0.9 % IV SOLN
INTRAVENOUS | Status: DC | PRN
  Administered 2019-01-28: 08:00:00 via INTRAVENOUS

## 2019-01-28 MED ORDER — CALCIUM CARBONATE ANTACID 500 MG PO CHEW
2.0000 | CHEWABLE_TABLET | Freq: Three times a day (TID) | ORAL | Status: DC | PRN
  Administered 2019-01-28 – 2019-01-30 (×2): 400 mg via ORAL
  Filled 2019-01-28 (×3): qty 2

## 2019-01-28 NOTE — Progress Notes (Signed)
Amy Becker.  PROGRESS NOTE    Amy Becker  ZOX:[id number] DOB: 06/28/1951 DOA: 01/26/2019 PCP: Orson Aloelough, Jeffrey David, MD   Brief Narrative:   TrudyComptonis a68 y.o.female,w hypertension, etoh abuse, tobacco use. Has c/o generalized weakness and low sodium . [name] sodium.    Assessment & Plan:   Principal Problem:   Hyponatremia Active Problems:   ETOH abuse   Tobacco abuse   Hyponatremia     - low serum and urine osm; high urine Na+     - TSH ok     - AM cortisol pending     - responding to fluids     - Na+ is 128 today  ETOH dep     - reports a 1/2-bottle of wine daily habit; last drink 7/5; she checked herself into [facility]     - continue librium taper     - CIWA  Tobacco use     - Nicotine patch 14mg  topically qday     - Pt counselled on smoking cessation  Hypertension     - Cont lisinopril 40mg  po qday     - Hydralazine 5mg  iv q6h prn   Slight dry cough (dry)     - CXR negative     - Please quit smoking  Anxiety/ Depression     - Continue Zoloft and Trazodone; question if these are playing a roll in her hyponatremia  ? Chronic pain     - Cont diclofenac and gabapentin  Generalized weakness     - PT to see  Still awaiting COVID r/o testing. Back to [name] after. Encourage PO intake. Na+ stable. Mentation stable.    DVT prophylaxis: lovenox Code Status: FULL   Disposition Plan: To [facility]   Subjective: "The hospital food is terrible. I can't eat."  Objective: Vitals:   01/27/19 2117 01/28/19 0404 01/28/19 1418 01/28/19 1420  BP: 122/62 (!) 155/89 (!) 153/98   Pulse: 62 66 75 73  Resp: 16 16 20    Temp: (!) 97.5 F (36.4 C) (!) 97.5 F (36.4 C) 98.2 F (36.8 C)   TempSrc:   Oral   SpO2: 95% 97% (!) 74% 99%  Weight:      Height:        Intake/Output Summary (Last 24 hours) at 01/28/2019 1440 Last data filed at 01/28/2019 1128 Gross per 24 hour  Intake 240 ml   Output 2227 ml  Net -1987 ml   Filed Weights   01/26/19 1311  Weight: 52.6 kg    Examination:  General: 68 y.o. female resting in bed in NAD Cardiovascular: RRR, +S1, S2, no m/g/r, equal pulses throughout Respiratory: CTABL, no w/r/r, normal WOB GI: BS+, NDNT, no masses noted, no organomegaly noted MSK: No e/c/c Skin: No rashes, bruises, ulcerations noted Neuro: A&O x 3, no focal deficits Psyc: Appropriate interaction and affect, calm/cooperative    Data Reviewed: I have personally reviewed following labs and imaging studies.  CBC: Recent Labs  Lab 01/26/19 1408 01/27/19 0550 01/28/19 0801  WBC 7.4 7.8 7.8  NEUTROABS 4.9  --  5.5  HGB 14.8 13.0 12.7  HCT 40.6 37.1 37.8  MCV 98.8 101.1* 103.8*  PLT 252 189 218   Basic Metabolic Panel: Recent Labs  Lab 01/26/19 1408 01/26/19 2255 01/27/19 0550 01/28/19 0801  NA 124* 125* 127* 128*  K 3.9 3.7 3.6 4.3  CL 83* 85* 92* 95*  CO2 28 28 24  23  GLUCOSE 72 84 80 89  BUN 21 20 18 13   CREATININE 0.76 0.69 0.62 0.52  CALCIUM 10.6* 9.9 9.4 8.7*  MG  --   --   --  1.9  PHOS  --   --   --  3.5   GFR: Estimated Creatinine Clearance: 47.7 mL/min (by C-G formula based on SCr of 0.52 mg/dL). Liver Function Tests: Recent Labs  Lab 01/26/19 1408 01/27/19 0550 01/28/19 0801  AST 34 25  --   ALT 18 14  --   ALKPHOS 60 50  --   BILITOT 0.6 0.3  --   PROT 7.2 5.5*  --   ALBUMIN 4.2 3.5 3.5   No results for input(s): LIPASE, AMYLASE in the last 168 hours. No results for input(s): AMMONIA in the last 168 hours. Coagulation Profile: No results for input(s): INR, PROTIME in the last 168 hours. Cardiac Enzymes: No results for input(s): CKTOTAL, CKMB, CKMBINDEX, TROPONINI in the last 168 hours. BNP (last 3 results) No results for input(s): PROBNP in the last 8760 hours. HbA1C: No results for input(s): HGBA1C in the last 72 hours. CBG: No results for input(s): GLUCAP in the last 168 hours. Lipid Profile: No results  for input(s): CHOL, HDL, LDLCALC, TRIG, CHOLHDL, LDLDIRECT in the last 72 hours. Thyroid Function Tests: Recent Labs    01/26/19 2255  TSH 3.041   Anemia Panel: No results for input(s): VITAMINB12, FOLATE, FERRITIN, TIBC, IRON, RETICCTPCT in the last 72 hours. Sepsis Labs: No results for input(s): PROCALCITON, LATICACIDVEN in the last 168 hours.  No results found for this or any previous visit (from the past 240 hour(s)).       Radiology Studies: Dg Chest Port 1 View  Result Date: 01/26/2019 CLINICAL DATA:  Cough. Hyponatremia. EXAM: PORTABLE CHEST 1 VIEW COMPARISON:  None. FINDINGS: Mild rotation. Heart size upper normal with normal mediastinal contours. Aortic atherosclerosis. Pulmonary vasculature is normal. No consolidation, pleural effusion, or pneumothorax. Skin fold projects over the left upper chest wall. No acute osseous abnormalities are seen. IMPRESSION: No acute chest findings. Aortic Atherosclerosis (ICD10-I70.0). Electronically Signed   By: Keith Rake M.D.   On: 01/26/2019 20:04        Scheduled Meds: . diclofenac  75 mg Oral BID WC  . docusate sodium  100 mg Oral BID  . enoxaparin (LOVENOX) injection  40 mg Subcutaneous Q24H  . feeding supplement (ENSURE ENLIVE)  237 mL Oral BID BM  . folic acid  1 mg Oral Daily  . gabapentin  600 mg Oral BID  . lisinopril  40 mg Oral Daily  . LORazepam  0-4 mg Intravenous Q6H   Followed by  . LORazepam  0-4 mg Intravenous Q12H  . multivitamin with minerals  1 tablet Oral Daily  . nicotine  14 mg Transdermal Q24H  . sertraline  100 mg Oral Daily  . thiamine  100 mg Oral Daily   Or  . thiamine  100 mg Intravenous Daily  . traZODone  100 mg Oral QHS   Continuous Infusions: . sodium chloride 10 mL/hr at 01/28/19 0816     LOS: 2 days    Time spent: 25 minutes spent in the coordination of care today.    Jonnie Finner, DO [facility] Pager [phone]  If 7PM-7AM, please contact night-coverage  [email or web address] Password [email or web address] 01/28/2019, 2:40 PM

## 2019-01-29 LAB — CBC WITH DIFFERENTIAL/PLATELET
Abs Immature Granulocytes: 0.02 10*3/uL (ref 0.00–0.07)
Basophils Absolute: 0.1 10*3/uL (ref 0.0–0.1)
Basophils Relative: 1 %
Eosinophils Absolute: 0.3 10*3/uL (ref 0.0–0.5)
Eosinophils Relative: 4 %
HCT: 37.2 % (ref 36.0–46.0)
Hemoglobin: 12.7 g/dL (ref 12.0–15.0)
Immature Granulocytes: 0 %
Lymphocytes Relative: 14 %
Lymphs Abs: 0.9 10*3/uL (ref 0.7–4.0)
MCH: 35 pg — ABNORMAL HIGH (ref 26.0–34.0)
MCHC: 34.1 g/dL (ref 30.0–36.0)
MCV: 102.5 fL — ABNORMAL HIGH (ref 80.0–100.0)
Monocytes Absolute: 0.9 10*3/uL (ref 0.1–1.0)
Monocytes Relative: 15 %
Neutro Abs: 4.1 10*3/uL (ref 1.7–7.7)
Neutrophils Relative %: 66 %
Platelets: 194 10*3/uL (ref 150–400)
RBC: 3.63 MIL/uL — ABNORMAL LOW (ref 3.87–5.11)
RDW: 11.9 % (ref 11.5–15.5)
WBC: 6.2 10*3/uL (ref 4.0–10.5)
nRBC: 0 % (ref 0.0–0.2)

## 2019-01-29 LAB — MAGNESIUM: Magnesium: 2 mg/dL (ref 1.7–2.4)

## 2019-01-29 LAB — RENAL FUNCTION PANEL
Albumin: 3.3 g/dL — ABNORMAL LOW (ref 3.5–5.0)
Anion gap: 8 (ref 5–15)
BUN: 13 mg/dL (ref 8–23)
CO2: 24 mmol/L (ref 22–32)
Calcium: 9.4 mg/dL (ref 8.9–10.3)
Chloride: 99 mmol/L (ref 98–111)
Creatinine, Ser: 0.57 mg/dL (ref 0.44–1.00)
GFR calc Af Amer: >60 mL/min (ref >60)
GFR calc non Af Amer: >60 mL/min (ref >60)
Glucose, Bld: 79 mg/dL (ref 70–99)
Phosphorus: 3.1 mg/dL (ref 2.5–4.6)
Potassium: 3.9 mmol/L (ref 3.5–5.1)
Sodium: 131 mmol/L — ABNORMAL LOW (ref 135–145)

## 2019-01-29 MED ORDER — FOLIC ACID 1 MG PO TABS: 1.0000 mg | ORAL_TABLET | Freq: Every day | ORAL | Status: AC

## 2019-01-29 MED ORDER — CALCIUM CARBONATE ANTACID 500 MG PO CHEW: 2.0000 | CHEWABLE_TABLET | Freq: Three times a day (TID) | ORAL | Status: AC | PRN

## 2019-01-29 MED ORDER — LISINOPRIL 40 MG PO TABS: 40.0000 mg | ORAL_TABLET | Freq: Every day | ORAL | 0 refills | Status: AC

## 2019-01-29 MED ORDER — ADULT MULTIVITAMIN W/MINERALS CH: 1.0000 | ORAL_TABLET | Freq: Every day | ORAL | Status: AC

## 2019-01-29 MED ORDER — SODIUM CHLORIDE 0.9 % IV SOLN
INTRAVENOUS | Status: DC
  Administered 2019-01-29: 14:00:00 1000 mL via INTRAVENOUS
  Administered 2019-01-30: 03:00:00 via INTRAVENOUS

## 2019-01-29 MED ORDER — LORAZEPAM 1 MG PO TABS
1.0000 mg | ORAL_TABLET | Freq: Once | ORAL | Status: AC
  Administered 2019-01-29: 1 mg via ORAL
  Filled 2019-01-29: qty 1

## 2019-01-29 NOTE — Progress Notes (Signed)
[name].  PROGRESS NOTE    Amy Becker  WUJ:[id number] DOB: 11/24/1950 DOA: 01/26/2019 PCP: Orson Aloelough, Amy David, MD   Brief Narrative:   TrudyComptonis a68 y.o.female,w hypertension, etoh abuse, tobacco use. Has c/o generalized weakness and low sodium . [facility].   Assessment & Plan:   Principal Problem:   Hyponatremia Active Problems:   ETOH abuse   Tobacco abuse   Hyponatremia - low serum and urine osm; high urine Na+ - TSH ok - AM cortisol normal - Na+ is 131     - improved on minimal fluids, but increased oral intake; question "tea&toast"; encourage diet and PO fluids w/ proper electrolytes  ETOH dep - reports a 1/2-bottle of wine daily habit; last drink 7/5; she [facility] - continue librium taper - CIWA     - [facility]  Tobacco use - Nicotine patch 14mg  topically qday - Pt counselled on smoking cessation  Hypertension - Cont lisinopril 40mg  po qday - Hydralazine 5mg  iv q6h prn   Slight dry cough (dry) - CXR negative - Please quit smoking  Anxiety/ Depression - Continue Zoloft and Trazodone; question if these are playing a roll in her hyponatremia  ? Chronic pain - Cont diclofenac and gabapentin  Generalized weakness - PT cleared, no further PT follow up.   DVT prophylaxis:lovenox Code Status:FULL Disposition Plan:To [facility] when [facility]   Subjective: Subjective: "The hospital food is terrible. I can't eat."  Objective: Vitals:   01/29/19 1127 01/29/19 1146 01/29/19 1210 01/29/19 1304  BP: (!) 174/102 (!) 174/102 (!) 171/97 (!) 154/80  Pulse:  69 68 75  Resp:   18   Temp:   98.2 F (36.8 C)   TempSrc:   Oral   SpO2:   97%   Weight:      Height:        Intake/Output Summary (Last 24 hours) at 01/29/2019 1648 Last data filed at 01/29/2019 1335 Gross per 24 hour   Intake 1391.21 ml  Output 250 ml  Net 1141.21 ml   Filed Weights   01/26/19 1311  Weight: 52.6 kg    Examination:  General:68 y.o.femaleresting in bed in NAD Cardiovascular: RRR, +S1, S2, no m/g/r, equal pulses throughout Respiratory: CTABL, no w/r/r, normal WOB GI: BS+, NDNT, no masses noted, no organomegaly noted MSK: No e/c/c Skin: No rashes, bruises, ulcerations noted Neuro: A&O x 3, no focal deficits Psyc: Appropriate interaction and affect, calm/cooperative    Data Reviewed: I have personally reviewed following labs and imaging studies.  CBC: Recent Labs  Lab 01/26/19 1408 01/27/19 0550 01/28/19 0801 01/29/19 0629  WBC 7.4 7.8 7.8 6.2  NEUTROABS 4.9  --  5.5 4.1  HGB 14.8 13.0 12.7 12.7  HCT 40.6 37.1 37.8 37.2  MCV 98.8 101.1* 103.8* 102.5*  PLT 252 189 218 194   Basic Metabolic Panel: Recent Labs  Lab 01/26/19 1408 01/26/19 2255 01/27/19 0550 01/28/19 0801 01/29/19 0629  NA 124* 125* 127* 128* 131*  K 3.9 3.7 3.6 4.3 3.9  CL 83* 85* 92* 95* 99  CO2 28 28 24 23 24   GLUCOSE 72 84 80 89 79  BUN 21 20 18 13 13   CREATININE 0.76 0.69 0.62 0.52 0.57  CALCIUM 10.6* 9.9 9.4 8.7* 9.4  MG  --   --   --  1.9 2.0  PHOS  --   --   --  3.5 3.1  GFR: Estimated Creatinine Clearance: 47.7 mL/min (by C-G formula based on SCr of 0.57 mg/dL). Liver Function Tests: Recent Labs  Lab 01/26/19 1408 01/27/19 0550 01/28/19 0801 01/29/19 0629  AST 34 25  --   --   ALT 18 14  --   --   ALKPHOS 60 50  --   --   BILITOT 0.6 0.3  --   --   PROT 7.2 5.5*  --   --   ALBUMIN 4.2 3.5 3.5 3.3*   No results for input(s): LIPASE, AMYLASE in the last 168 hours. No results for input(s): AMMONIA in the last 168 hours. Coagulation Profile: No results for input(s): INR, PROTIME in the last 168 hours. Cardiac Enzymes: No results for input(s): CKTOTAL, CKMB, CKMBINDEX, TROPONINI in the last 168 hours. BNP (last 3 results) No results for input(s): PROBNP in the last 8760  hours. HbA1C: No results for input(s): HGBA1C in the last 72 hours. CBG: No results for input(s): GLUCAP in the last 168 hours. Lipid Profile: No results for input(s): CHOL, HDL, LDLCALC, TRIG, CHOLHDL, LDLDIRECT in the last 72 hours. Thyroid Function Tests: Recent Labs    01/26/19 2255  TSH 3.041   Anemia Panel: No results for input(s): VITAMINB12, FOLATE, FERRITIN, TIBC, IRON, RETICCTPCT in the last 72 hours. Sepsis Labs: No results for input(s): PROCALCITON, LATICACIDVEN in the last 168 hours.  Recent Results (from the past 240 hour(s))  Novel Coronavirus,NAA,(SEND-OUT TO REF LAB - TAT 24-48 hrs); Hosp Order     Status: None   Collection Time: 01/26/19  7:35 PM   Specimen: Nasopharyngeal Swab; Respiratory  Result Value Ref Range Status   SARS-CoV-2, NAA NOT DETECTED NOT DETECTED Final    Comment: (NOTE) This test was developed and its performance characteristics determined by [facility]. This test has not been FDA cleared or approved. This test has been authorized by FDA under an Emergency Use Authorization (EUA). This test is only authorized for the duration of time the declaration that circumstances exist justifying the authorization of the emergency use of in vitro diagnostic tests for detection of SARS-CoV-2 virus and/or diagnosis of COVID-19 infection under section 564(b)(1) of the Act, 21 U.S.C. 811BJY-7(W)(2360bbb-3(b)(1), unless the authorization is terminated or revoked sooner. When diagnostic testing is negative, the possibility of a false negative result should be considered in the context of a patient's recent exposures and the presence of clinical signs and symptoms consistent with COVID-19. An individual without symptoms of COVID-19 and who is not shedding SARS-CoV-2 virus would expect to have a negative (not detected) result in this assay. Performed  At: [facility] [address] [place], [place] [id number] Jolene SchimkeNagendra Sanjai MD VH:[id number]:[phone]     Coronavirus Source NASOPHARYNGEAL  Final    Comment: Performed at [facility], [address] [place], [place] [address]         Radiology Studies: No results found.      Scheduled Meds: . diclofenac  75 mg Oral BID WC  . docusate sodium  100 mg Oral BID  . enoxaparin (LOVENOX) injection  40 mg Subcutaneous Q24H  . feeding supplement (ENSURE ENLIVE)  237 mL Oral BID BM  . folic acid  1 mg Oral Daily  . gabapentin  600 mg Oral BID  . lisinopril  40 mg Oral Daily  . LORazepam  0-4 mg Intravenous Q12H  . multivitamin with minerals  1 tablet Oral Daily  . nicotine  14 mg Transdermal Q24H  . sertraline  100  mg Oral Daily  . thiamine  100 mg Oral Daily   Or  . thiamine  100 mg Intravenous Daily  . traZODone  100 mg Oral QHS   Continuous Infusions: . sodium chloride 10 mL/hr at 01/28/19 1600  . sodium chloride 1,000 mL (01/29/19 1335)     LOS: 3 days    Time spent: 35 minutes spent in the coordination of care today.    Jonnie Finner, DO [facility] Pager [phone]  If 7PM-7AM, please contact night-coverage [email or web address] Password [facility] 01/29/2019, 4:48 PM

## 2019-01-29 NOTE — TOC Progression Note (Addendum)
Transition of Care [facility]) - Progression Note    Patient Details  Name: SHANTIL VALLEJO MRN: [id number] Date of Birth: 1951/06/02  [facility] CM/SW [place], LCSW Phone Number: 01/29/2019, 3:20 PM  Clinical Narrative:   [facility] stated they would like patient to be seen by PT prior to coming back to facility. Order by MD has been placed, CSW spoke with pt RN asking for assistance with having PT see patient today for evaluation prior to dc to [facility]. Awaiting PT notes    Expected Discharge Plan: ([facility]) Barriers to Discharge: No Barriers Identified  Expected Discharge Plan and Services Expected Discharge Plan: ([facility]) In-house Referral: Clinical Social Work     Living arrangements for the past 2 months: Single Family Home, Post-Acute Facility([facility])                                       Social Determinants of Health (SDOH) Interventions    Readmission Risk Interventions No flowsheet data found.

## 2019-01-29 NOTE — Discharge Summary (Addendum)
. Physician Discharge Summary  Amy Becker ONG:[id number] DOB: 12/24/1950 DOA: 01/26/2019  PCP: Orson Aloelough, Jeffrey David, MD  Admit date: 01/26/2019 Discharge date: 01/30/2019  [facility] Disposition:  [facility]charged to [facility]  Recommendations for Outpatient Follow-up:  1. Follow up with PCP in 1-2 weeks 2. Please obtain BMP/CBC in one week   Discharge Condition: Stable  CODE STATUS: FULL  Brief/Interim Summary: TrudyComptonis a68 y.o.female,w hypertension, etoh abuse, tobacco use. Has c/o generalized weakness and low sodium . [facility]  Discharge Diagnoses:  Principal Problem:   Hyponatremia Active Problems:   ETOH abuse   Tobacco abuse  Hyponatremia - low serum and urine osm; high urine Na+ - TSH ok - AM cortisol normal - Na+ is 131 today     - improved on minimal fluids, but increased oral intake; question "tea&toast"; encourage diet and PO fluids w/ proper electrolytes  ETOH dep - reports a 1/2-bottle of wine daily habit; last drink 7/5; [facility] - continue librium taper - CIWA     - [facility]  Tobacco use - Nicotine patch 14mg  topically qday - Pt counselled on smoking cessation  Hypertension - Cont lisinopril 40mg  po qday - Hydralazine 5mg  iv q6h prn   Slight dry cough (dry) - CXR negative - Please quit smoking  Anxiety/ Depression - Continue Zoloft and Trazodone; question if these are playing a roll in her hyponatremia  ? Chronic pain - Cont diclofenac and gabapentin  Generalized weakness     - PT cleared, no further PT follow up.  Discharge Instructions   Allergies as of 01/29/2019      Reactions   Hydromorphone Hives   Cefaclor Hives   Tetracycline Rash      Medication List    TAKE these medications   calcium carbonate 500 MG chewable  tablet Commonly known as: TUMS - dosed in mg elemental calcium Chew 2 tablets (400 mg of elemental calcium total) by mouth 3 (three) times daily as needed for indigestion or heartburn.   diclofenac 75 MG EC tablet Commonly known as: VOLTAREN Take 75 mg by mouth 2 (two) times daily with a meal.   folic acid 1 MG tablet Commonly known as: FOLVITE Take 1 tablet (1 mg total) by mouth daily. Start taking on: January 30, 2019   gabapentin 600 MG tablet Commonly known as: NEURONTIN Take 600 mg by mouth 2 (two) times a day.   lisinopril 40 MG tablet Commonly known as: ZESTRIL Take 1 tablet (40 mg total) by mouth daily. Start taking on: January 30, 2019   LORazepam 1 MG tablet Commonly known as: ATIVAN Take 1 mg by mouth daily as needed for anxiety.   multivitamin with minerals Tabs tablet Take 1 tablet by mouth daily. Start taking on: January 30, 2019   sertraline 100 MG tablet Commonly known as: ZOLOFT Take 100 mg by mouth daily.   traZODone 100 MG tablet Commonly known as: DESYREL Take 100 mg by mouth at bedtime.       Allergies  Allergen Reactions  . Hydromorphone Hives  . Cefaclor Hives  . Tetracycline Rash     Procedures/Studies: Dg Chest Port 1 View  Result Date: 01/26/2019 CLINICAL DATA:  Cough. Hyponatremia. EXAM: PORTABLE CHEST 1 VIEW COMPARISON:  None. FINDINGS: Mild rotation. Heart size upper normal with normal mediastinal contours. Aortic atherosclerosis. Pulmonary vasculature is normal. No consolidation, pleural effusion, or pneumothorax. Skin  fold projects over the left upper chest wall. No acute osseous abnormalities are seen. IMPRESSION: No acute chest findings. Aortic Atherosclerosis (ICD10-I70.0). Electronically Signed   By: Narda RutherfordMelanie  Sanford M.D.   On: 01/26/2019 20:04      Subjective: "Why is it this way?"  Discharge Exam: Vitals:   01/29/19 1146 01/29/19 1210  BP: (!) 174/102 (!) 171/97  Pulse: 69 68  Resp:  18  Temp:  98.2 F (36.8 C)  SpO2:  97%    Vitals:   01/29/19 0509 01/29/19 1127 01/29/19 1146 01/29/19 1210  BP: (!) 151/86 (!) 174/102 (!) 174/102 (!) 171/97  Pulse: 77  69 68  Resp: 18   18  Temp: 97.8 F (36.6 C)   98.2 F (36.8 C)  TempSrc: Oral   Oral  SpO2: 100%   97%  Weight:      Height:        General:68 y.o.femaleresting in bed in NAD Cardiovascular: RRR, +S1, S2, no m/g/r, equal pulses throughout Respiratory: CTABL, no w/r/r, normal WOB GI: BS+, NDNT, no masses noted, no organomegaly noted MSK: No e/c/c Skin: No rashes, bruises, ulcerations noted Neuro: A&O x 3, no focal deficits Psyc: Appropriate interaction and affect, calm/cooperative    The results of significant diagnostics from this hospitalization (including imaging, microbiology, ancillary and laboratory) are listed below for reference.     Microbiology: Recent Results (from the past 240 hour(s))  Novel Coronavirus,NAA,(SEND-OUT TO REF LAB - TAT 24-48 hrs); Hosp Order     Status: None   Collection Time: 01/26/19  7:35 PM   Specimen: Nasopharyngeal Swab; Respiratory  Result Value Ref Range Status   SARS-CoV-2, NAA NOT DETECTED NOT DETECTED Final    Comment: (NOTE) This test was developed and its performance characteristics determined by [facility]. This test has not been FDA cleared or approved. This test has been authorized by FDA under an Emergency Use Authorization (EUA). This test is only authorized for the duration of time the declaration that circumstances exist justifying the authorization of the emergency use of in vitro diagnostic tests for detection of SARS-CoV-2 virus and/or diagnosis of COVID-19 infection under section 564(b)(1) of the Act, 21 U.S.C. 161WRU-0(A)(5360bbb-3(b)(1), unless the authorization is terminated or revoked sooner. When diagnostic testing is negative, the possibility of a false negative result should be considered in the context of a patient's recent exposures and the presence of clinical signs  and symptoms consistent with COVID-19. An individual without symptoms of COVID-19 and who is not shedding SARS-CoV-2 virus would expect to have a negative (not detected) result in this assay. Performed  At: [facility] [address] [place] [id number] Jolene SchimkeNagendra Sanjai MD NW:[id number]:[phone]    Coronavirus Source NASOPHARYNGEAL  Final    Comment: Performed at [facility], [address] [place], [place] [address]     Labs: BNP (last 3 results) No results for input(s): BNP in the last 8760 hours. Basic Metabolic Panel: Recent Labs  Lab 01/26/19 1408 01/26/19 2255 01/27/19 0550 01/28/19 0801 01/29/19 0629  NA 124* 125* 127* 128* 131*  K 3.9 3.7 3.6 4.3 3.9  CL 83* 85* 92* 95* 99  CO2 28 28 24 23 24   GLUCOSE 72 84 80 89 79  BUN 21 20 18 13 13   CREATININE 0.76 0.69 0.62 0.52 0.57  CALCIUM 10.6* 9.9 9.4 8.7* 9.4  MG  --   --   --  1.9 2.0  PHOS  --   --   --  3.5 3.1   Liver Function Tests: Recent Labs  Lab 01/26/19 1408 01/27/19 0550 01/28/19 0801 01/29/19 0629  AST 34 25  --   --   ALT 18 14  --   --   ALKPHOS 60 50  --   --   BILITOT 0.6 0.3  --   --   PROT 7.2 5.5*  --   --   ALBUMIN 4.2 3.5 3.5 3.3*   No results for input(s): LIPASE, AMYLASE in the last 168 hours. No results for input(s): AMMONIA in the last 168 hours. CBC: Recent Labs  Lab 01/26/19 1408 01/27/19 0550 01/28/19 0801 01/29/19 0629  WBC 7.4 7.8 7.8 6.2  NEUTROABS 4.9  --  5.5 4.1  HGB 14.8 13.0 12.7 12.7  HCT 40.6 37.1 37.8 37.2  MCV 98.8 101.1* 103.8* 102.5*  PLT 252 189 218 194   Cardiac Enzymes: No results for input(s): CKTOTAL, CKMB, CKMBINDEX, TROPONINI in the last 168 hours. BNP: Invalid input(s): POCBNP CBG: No results for input(s): GLUCAP in the last 168 hours. D-Dimer No results for input(s): DDIMER in the last 72 hours. Hgb A1c No results for input(s): HGBA1C in the last 72 hours. Lipid Profile No results for input(s): CHOL, HDL,  LDLCALC, TRIG, CHOLHDL, LDLDIRECT in the last 72 hours. Thyroid function studies Recent Labs    01/26/19 2255  TSH 3.041   Anemia work up No results for input(s): VITAMINB12, FOLATE, FERRITIN, TIBC, IRON, RETICCTPCT in the last 72 hours. Urinalysis    Component Value Date/Time   COLORURINE YELLOW 01/26/2019 1934   APPEARANCEUR CLEAR 01/26/2019 1934   LABSPEC 1.006 01/26/2019 1934   PHURINE 7.0 01/26/2019 1934   GLUCOSEU NEGATIVE 01/26/2019 1934   HGBUR SMALL (A) 01/26/2019 1934   BILIRUBINUR NEGATIVE 01/26/2019 1934   KETONESUR NEGATIVE 01/26/2019 1934   PROTEINUR NEGATIVE 01/26/2019 1934   NITRITE NEGATIVE 01/26/2019 1934   LEUKOCYTESUR NEGATIVE 01/26/2019 1934   Sepsis Labs Invalid input(s): PROCALCITONIN,  WBC,  LACTICIDVEN Microbiology Recent Results (from the past 240 hour(s))  Novel Coronavirus,NAA,(SEND-OUT TO REF LAB - TAT 24-48 hrs); Hosp Order     Status: None   Collection Time: 01/26/19  7:35 PM   Specimen: Nasopharyngeal Swab; Respiratory  Result Value Ref Range Status   SARS-CoV-2, NAA NOT DETECTED NOT DETECTED Final    Comment: (NOTE) This test was developed and its performance characteristics determined by [employer]. This test has not been FDA cleared or approved. This test has been authorized by FDA under an Emergency Use Authorization (EUA). This test is only authorized for the duration of time the declaration that circumstances exist justifying the authorization of the emergency use of in vitro diagnostic tests for detection of SARS-CoV-2 virus and/or diagnosis of COVID-19 infection under section 564(b)(1) of the Act, 21 U.S.C. 614ERX-5(Q)(0), unless the authorization is terminated or revoked sooner. When diagnostic testing is negative, the possibility of a false negative result should be considered in the context of a patient's recent exposures and the presence of clinical signs and symptoms consistent with COVID-19. An individual without  symptoms of COVID-19 and who is not shedding SARS-CoV-2 virus would expect to have a negative (not detected) result in this assay. Performed  At: [facility] [place], [place] [id number] Rush Farmer MD DT:[id number]    [place]  Final    Comment: Performed at [facility] [address], [place], [place] [address]     Time coordinating discharge: 35 minutes  SIGNED:  Teddy Spikeyrone A Khali Perella, DO  [facility] 01/29/2019, 12:56 PM Pager   If 7PM-7AM, please contact night-coverage [email or web address] Password [email or web address]

## 2019-01-29 NOTE — TOC Initial Note (Signed)
Transition of Care [facility]) - Initial/Assessment Note    Patient Details  Name: Amy Becker MRN: [id number] Date of Birth: Jun 08, 1951  [facility]) CM/SW Contact:    Wende Neighbors, LCSW Phone Number: 01/29/2019, 11:26 AM  Clinical Narrative:    Patient came from [place] and will return once [facility] has approved her return. CSW spoke with RN Langley Gauss at facility and she requested paperwork to be faxed to her prior to approval. Langley Gauss stated she will reach back out to CSW when they approve patient for return          Expected [facility]: ([facility]) Barriers to Discharge: No Barriers Identified   Patient Goals and CMS Choice        Expected Discharge Plan and Services Expected Discharge Plan: ([facility]) In-house Referral: Clinical Social Work     Living arrangements for the past 2 months: Single Family Home, [facility]-Acute Facility([facility])                                      Prior Living Arrangements/Services Living arrangements for the past 2 months: Single Family Home, [facility]Ac[facility] Facility([facility])   Patient language and need for interpreter reviewed:: Yes        Need for Family Participation in Patient Care: Yes (Comment) Care giver support system in place?: Yes (comment)   Criminal Activity/Legal Involvement Pertinent to Current Situation/Hospitalization: No - Comment as needed  Activities of Daily Living Home Assistive Devices/Equipment: Eyeglasses ADL Screening (condition at time of admission) Patient's cognitive ability adequate to safely complete daily activities?: Yes Is the patient deaf or have difficulty hearing?: No Does the patient have difficulty seeing, even when wearing glasses/contacts?: No Does the patient have difficulty concentrating, remembering, or making decisions?: No Patient able to express need for assistance with ADLs?: Yes Does the patient have difficulty dressing or bathing?:  No Independently performs ADLs?: Yes (appropriate for developmental age) Does the patient have difficulty walking or climbing stairs?: Yes Weakness of Legs: Both Weakness of Arms/Hands: None  Permission Sought/Granted Permission sought to share information with : Case Manager Permission granted to share information with : Yes, Verbal Permission Granted  Share Information with NAME: Dawnetta Copenhaver  Permission granted to share info w AGENC[facility]: [facility]  Permission granted to share info w Relationship: spouse  Permission granted to share info w Contact Information: [phone]  Emotional Assessment Appearance:: Appears stated age Attitude/Demeanor/Rapport: Unable to Assess Affect (typically observed): Unable to Assess Orientation: : Oriented to Self, Oriented to Place, Oriented to  Time, Oriented to Situation Alcohol / Substance Use: Alcohol Use Psych Involvement: No (comment)  Admission diagnosis:  Cough [R05] Hyponatremia [E87.1] Patient Active Problem List   Diagnosis Date Noted  . Hyponatremia 01/26/2019  . ETOH abuse 01/26/2019  . Tobacco abuse 01/26/2019   PCP:  Cleotis Lema, MD Pharmacy:   [facility] [place], [place] [facility] [address] AT [address] [facility] [place] [address] Phone: [phone] Fax: [phone]     Social Determinants of Health (SDOH) Interventions    Readmission Risk Interventions No flowsheet data found.

## 2019-01-29 NOTE — TOC Progression Note (Signed)
Transition of Care [facility]) - Progression Note    Patient Details  Name: LASHUNA TAMASHIRO MRN: [id number] Date of Birth: [birth year]  [facility]) CM/SW Contact  Joaquin Courts, RN Phone Number: 01/29/2019, 5:13 PM  Clinical Narrative:    CM received call from [facility] stating that they received faxed therapy evaluation note however were a bit confused about the recommendation.  State that PT has signed off but still stated in the note that they wanted patient to ambulate again with staff prior to d/c to increase activity tolerance. As such [facility] tomorrow after the patient has had time to ambulate with staff. Goal is for an early discharge in the am.     Expected Discharge Plan: ([facility]) Barriers to Discharge: No Barriers Identified  Expected Discharge Plan and Services Expected Discharge Plan: ([facility]) In-house Referral: Clinical Social Work     Living arrangements for the past 2 months: Single Family Home, [facility] Facility([facility])                                       Social Determinants of Health (SDOH) Interventions    Readmission Risk Interventions No flowsheet data found.

## 2019-01-29 NOTE — Evaluation (Addendum)
Physical Therapy Evaluation Patient Details Name: Amy Becker K Conely MRN: [id number] DOB: 08/28/1950 Today's Date: 01/29/2019   History of Present Illness  68 y.o. female,w hypertension, etoh abuse, tobacco use. Has c/o generalized weakness and low sodium .  [facility] PMHx: ETOH use, tobacco use  Clinical Impression  Patient evaluated by Physical Therapy with no further acute PT needs identified. All education has been completed and the patient has no further questions.  Pt quite cooperative with PT, stated she felt a little weak d/t not walking since admission. Pt initially with guarded gait however improved with incr distance and cues for UE swing. No overt LOB with amb or with dynamic testing.  See below for details Would encourage pt to amb with staff again prior to d/c to incr activity tolerance.  See below for any follow-up Physical Therapy or equipment needs. PT is signing off. Thank you for this referral.     Follow Up Recommendations No PT follow up    Equipment Recommendations  None recommended by PT    Recommendations for Other Services       Precautions / Restrictions Precautions Precautions: Fall Restrictions Weight Bearing Restrictions: No      Mobility  Bed Mobility Overal bed mobility: Modified Independent                Transfers Overall transfer level: Needs assistance   Transfers: Sit to/from Stand Sit to Stand: Supervision         General transfer comment: for safety initially, repeated second trial end of session and pt mod I  Ambulation/Gait Ambulation/Gait assistance: Min guard;Supervision;Modified independent (Device/Increase time) Gait Distance (Feet): 160 Feet Assistive device: None;IV Pole Gait Pattern/deviations: Step-through pattern;Wide base of support Gait velocity: decr   General Gait Details: pt with initially guarded gait, improved with incr  distance,  cues for UE  swing  Stairs             Wheelchair Mobility    Modified Rankin (Stroke Patients Only)       Balance Overall balance assessment: Needs assistance;History of Falls Sitting-balance support: Feet supported;No upper extremity supported Sitting balance-Leahy Scale: Good     Standing balance support: No upper extremity supported Standing balance-Leahy Scale: Fair Standing balance comment: fair to good, not tested beyond min perturbations and activity below Single Leg Stance - Right Leg: 0 Single Leg Stance - Left Leg: 2         High level balance activites: Backward walking;Direction changes;Sudden stops;Head turns High Level Balance Comments: no LOB with above, guarded with initial challenges but gait more fluid with incr time and repetition. pt able to pick up objects from floor without LOB             Pertinent Vitals/Pain Pain Assessment: No/denies pain    Home Living Family/patient expects to be discharged to:: Other (Comment)                 Additional Comments[facility] at d/c    Prior Function Level of Independence: Independent               Hand Dominance        Extremity/Trunk Assessment   Upper Extremity Assessment Upper Extremity Assessment: Overall WFL for tasks assessed    Lower Extremity Assessment Lower Extremity Assessment: RLE deficits/detail;LLE deficits/detail RLE Deficits / Details: grossly WFL RLE Coordination: decreased fine motor LLE Deficits / Details: grossly WFL LLE  Coordination: decreased fine motor       Communication   Communication: No difficulties  Cognition Arousal/Alertness: Awake/alert Behavior During Therapy: WFL for tasks assessed/performed Overall Cognitive Status: Within Functional Limits for tasks assessed                                        General Comments      Exercises     Assessment/Plan    PT Assessment Patent does not need any further PT services   PT Problem List         PT Treatment Interventions      PT Goals (Current goals can be found in the Care Plan section)  Acute Rehab PT Goals PT Goal Formulation: All assessment and education complete, DC therapy    Frequency     Barriers to discharge        Co-evaluation               AM-PAC PT "6 Clicks" Mobility  Outcome Measure Help needed turning from your back to your side while in a flat bed without using bedrails?: None Help needed moving from lying on your back to sitting on the side of a flat bed without using bedrails?: None Help needed moving to and from a bed to a chair (including a wheelchair)?: None Help needed standing up from a chair using your arms (e.g., wheelchair or bedside chair)?: None Help needed to walk in hospital room?: None Help needed climbing 3-5 steps with a railing? : A Little 6 Click Score: 23    End of Session Equipment Utilized During Treatment: Gait belt Activity Tolerance: Patient tolerated treatment well Patient left: in bed;with call bell/phone within reach   PT Visit Diagnosis: Unsteadiness on feet (R26.81);History of falling (Z91.81)    Time: 3267-1245 PT Time Calculation (min) (ACUTE ONLY): 16 min   Charges:   PT Evaluation $PT Eval Low Complexity: 1 Low          Kenyon Ana, PT  Pager: [phone] Acute Rehab Dept [facility]): [phone]   01/29/2019   [facility] 01/29/2019, 4:32 PM

## 2019-01-30 NOTE — Progress Notes (Addendum)
Pt ambulated with staff in the room and hallway. Pt has a steady and even gait. Pt aware of surroundings and able to ambulate safely.

## 2019-01-30 NOTE — Care Management Important Message (Signed)
Important Message  Patient Details IM Letter given to Marney Doctor RN to present to the Patient Name: IDAMAE COCCIA MRN: [id number] Date of Birth: January 23, 1951   Medicare Important Message Given:  Yes     Kerin Salen 01/30/2019, 11:29 AM

## 2019-01-30 NOTE — TOC Transition Note (Signed)
Transition of Care [facility]) - CM/SW Discharge Note   Patient Details  Name: Amy Becker MRN: [id number] Date of Birth: 1951/05/18  [facility]) CM/SW Contact:  Lynnell Catalan, RN Phone Number: 01/30/2019, 10:25 AM   Clinical Narrative:    [name] [name] are [facility] to [facility]. [facility] once dc order is written. RN made aware.   Final next level of care: Other (comment)([facility]) Barriers to Discharge: No Barriers Identified

## 2019-02-03 ENCOUNTER — Ambulatory Visit: Payer: Medicare Other | Admitting: Physical Therapy

## 2019-02-06 ENCOUNTER — Ambulatory Visit: Payer: Medicare Other | Attending: Internal Medicine | Admitting: Physical Therapy

## 2019-02-06 ENCOUNTER — Encounter: Payer: Self-pay | Admitting: Physical Therapy

## 2019-02-06 ENCOUNTER — Other Ambulatory Visit: Payer: Self-pay

## 2019-02-06 DIAGNOSIS — M6281 Muscle weakness (generalized): Secondary | ICD-10-CM | POA: Diagnosis present

## 2019-02-06 DIAGNOSIS — R2689 Other abnormalities of gait and mobility: Secondary | ICD-10-CM | POA: Insufficient documentation

## 2019-02-06 DIAGNOSIS — R269 Unspecified abnormalities of gait and mobility: Secondary | ICD-10-CM | POA: Diagnosis present

## 2019-02-06 NOTE — Therapy (Signed)
[facility] [address] [place], [place], [address] Phone: [phone]   Fax:  [phone]  Physical Therapy Evaluation  Patient Details  Name: Amy Becker MRN: [id number] Date of Birth: 09-19-1950 Referring Provider (PT): Cleotis Lema, MD   Encounter Date: 02/06/2019  PT End of Session - 02/06/19 1351    Visit Number  1    Number of Visits  16    Date for PT Re-Evaluation  04/03/19    Authorization - Visit Number  1    Authorization - Number of Visits  10    PT Start Time  0821    PT Stop Time  0918    PT Time Calculation (min)  57 min    Equipment Utilized During Treatment  Gait belt    Activity Tolerance  Patient tolerated treatment well    Behavior During Therapy  [facility] for tasks assessed/performed       Past Medical History:  Diagnosis Date  . Alcohol abuse   . Hypertension   . Hyponatremia 01/26/2019    Past Surgical History:  Procedure Laterality Date  . ABDOMINAL HYSTERECTOMY      There were no vitals filed for this visit.   Subjective Assessment - 02/06/19 1244    Subjective  Pt states she "is not mobile" and is not going outside to walk. Pt reports her goal is that she would like to get back to walking and performing household ADLs like mopping and gardening.  Pt. c/o neck/ R shoulder pain is 1/10 today and 0/10 at its best and 4/10 at its worst.    Patient is accompained by:  Family member    Pertinent History  Pt reports she went to the hospital on the 9th because her sodium was low. Pt's son present. Pt's son reports sodium normalized while pt was at hospital. Pt states her BP has been "sky high" and not well controlled. Pt states she has difficulties with grip and gait. Pt reports problems with her "stride," and that she feels like she is going to fall. Pt reports she lives with son and spouse. Pt reports she is afraid of falling and has husband monitor her in bathroom in case she loses balance. Pt  reports difficulty getting up from toilet or out of the shower. Pt reports her legs feel weak. Pt reports she feels stressed often. Pt reports GI issues and diarrhea last week. Pt reports she was tested for COVID and is negative. Pt states she "is not mobile" and is not going outside to walk. Pt reports her goal is that she would like to get back to walking and performing household ADLs like mopping and gardening. Pt reports history of neck pain with shoulder pain, and that pain is 1/10 today and 0/10 at its best and 4/10 at its worst. Pt has headaches that radiate from R side of neck. Pt reports history of chronic back pain (since 68 yo). Pt states she Pt reports numbness in L anterior thigh that she reports has been for years. Pt reports she falls backward while walking and "just moving around." Pt reports she loses balance when she bends forward. Pt reports she has fallen 4 times in last 6 months but not last week or two.    Limitations  Walking;House hold activities;Standing    Patient Stated Goals  Complete household chores/ garden    Currently in Pain?  Yes    Pain Score  1  Pain Location  Shoulder    Pain Orientation  Right    Pain Descriptors / Indicators  Aching    Pain Type  Chronic pain    Pain Onset  More than a month ago         [facility] PT Assessment - 02/06/19 0001      Assessment   Medical Diagnosis  Gait abnormality    Referring Provider (PT)  Orson AloeJeffrey David Clough, MD    Onset Date/Surgical Date  01/29/19    Hand Dominance  Right    Next MD Visit  TBD    Prior Therapy  Not reported.  [facility]      Balance Screen   Has the patient fallen in the past 6 months  Yes    How many times?  4x    Has the patient had a decrease in activity level because of a fear of falling?   Yes    Is the patient reluctant to leave their home because of a fear of falling?   Yes      Home Public house managernvironment   Living Environment  Private residence      Prior Function   Level of Independence   Independent      Cognition   Overall Cognitive Status  Within Functional Limits for tasks assessed       Blood pressure seated: 160/90, HR 72    FOTO score: 28 (norm for age 68)  Posture: thoracic kyphosis, forward head posture   Sensation: L LE diminished/numb compared to R L4-S1 dermatomes  Proprioception LE: in tact  MMT:   Grip R: 35 mmHG Grip L: 13  mmHG  LEs: Hip flexion R/L: 4/5 B  Knee extension R/L: 4/5 painful,  Pt would not fully attempt L LE knee extension possibly secondary to pain Knee flexion R/L: 3+/5 B DF R/L: 4+/5 PF R/L: 4+/5 Hip abduction R/L 4+/5 Hip adduction: R/L 4/5 Hip extension R/L:  4/5 B   Ankle dorsiflexion: R neutral, L -4 degrees from neutral  SPECIAL TESTS Berg:37 indicating significant fall risk Single leg heel raise test (norm 19.1 in subjects w pathology): R/L 6/2. Performed with B UE support TUG: 25.31, 24.53, 16.40 sec: Average 22.08 seconds; Pt required frequent cues to stay on task TUG Cog: 20.81, 21.88, 20.37: Average 21.02; difficulty naming while walking  5xSTS: 16.35  Modified CTSIB: Able to perform standing EO, Standing EC, difficulty with standing on foam EO and EC (maintains balance <3 seconds standing on foam for both EO and EC)  Objective measurements completed on examination: See above findings.   Therapeutic exercise: Standing heel raises 2x15 with UE support    See HEP  PT Education - 02/06/19 1350    Education Details  Pt educated on HEP (issued RTB)- standing heel raises/ lateral stepping/ standing marching, exam findings and prognosis    Person(s) Educated  Patient;Child(ren)    Methods  Explanation;Demonstration;Handout    Comprehension  Verbalized understanding;Returned demonstration          PT Long Term Goals - 02/06/19 1408      PT LONG TERM GOAL #1   Title  Pt FOTO score will increase from 28 to 48 to improve functional mobility    Baseline  Pt score 28 02/06/2019    Time  8    Period   Weeks    Status  New    Target Date  04/03/19      PT LONG TERM GOAL #2  Title  Pt Berg score will be at least 48/56 to decrease fall risk    Baseline  Pt Berg score is 37 02/06/2019    Time  8    Period  Weeks    Status  New    Target Date  04/03/19      PT LONG TERM GOAL #3   Title  Pt will be able to perform 5xSTS in 12 seconds or less with UE support    Baseline  16.35 seconds with UE support 02/06/2019    Time  4    Period  Weeks    Status  New    Target Date  03/06/19      PT LONG TERM GOAL #4   Title  Pt will increase LE MMT scores by at least 1/2 a grade to improve LE strength and safety with gait    Baseline  Hip flexion R/L: 4/5 B; Knee extension R/L: 4/5 painful, no attempt on L; Knee flexion R/L: 3+/5 B  DF R/L: 4+/5;  PF R/L: 4+/5; Hip abduction R/L 4+/5; hip adduction: R/L 4/5; Hip extension R/L: 4/5 B 02/06/2019    Time  4    Period  Weeks    Status  New    Target Date  03/06/19      PT LONG TERM GOAL #5   Title  Pt will decrease TUG time to 15 seconds or less for improved mobility and safety with ADLs such as toileting and getting in and out of the shower.    Baseline  TUG 22.08 sec 02/06/2019    Time  8    Period  Weeks    Status  New    Target Date  04/03/19         Plan - 02/06/19 1422    Clinical Impression Statement  Pt. is a pleasant 68 y/o female presenting to PT with a c/o of decreased balance and falls. Pt presents with decreased LE strength: Hip flexion R/L: 4/5 B; Knee extension R/L: 4/5 MMT painful, no attempt on L; Knee flexion R/L: 3+/5 MMT B  DF R/L: 4+/5 MMT;  PF R/L: 4+/5 MMT; Hip abduction R/L 4+/5 MMT; hip adduction: R/L 4/5 MMT; Hip extension R/L: 4/5 MMT B. Pt has decreased ankle dorsiflexion ROM: Ankle dorsiflexion: R neutral, L -4 degrees from neutral, limiting pt's ability to use ankle strategies to maintain balance. Pt also demonstrates significant fall risk with Berg score 37/56 and average TUG of 22.08 seconds. Pt also with difficulty  performing EO and EC on foam, losing balance in <3 seconds for both, indicating possible decreased vestibular function. Pt unable to perform 5xSTS without B UE support and completed 5 sit<>stands in 16.35 seconds, indicating decreased LE strength and power. Pt completed single leg heel raise test with UE support: R/L 6 reps/2 reps (norm is 19.1 B). Pt will continue to benefit from further skilled therapy to increase LE strength, ankle dorsiflexion ROM, and to decrease fall risk.    Personal Factors and Comorbidities  Age    Examination-Activity Limitations  Bathing;Bed Mobility;Bend;Hygiene/Grooming;Locomotion Level;Reach Overhead;Squat;Stairs;Stand;Toileting    Examination-Participation Restrictions  Cleaning;Community Activity;Laundry;Yard Work    Conservation officer, historic buildingstability/Clinical Decision Making  Evolving/Moderate complexity    Clinical Decision Making  Moderate    Rehab Potential  Good    PT Frequency  2x / week    PT Duration  8 weeks    PT Treatment/Interventions  ADLs/Self Care Home Management;Traction;Cryotherapy;Electrical Stimulation;DME Instruction;Gait Network engineertraining;Stair training;Therapeutic activities;Therapeutic exercise;Balance training;Neuromuscular re-education;Patient/family education;Manual techniques;Passive  range of motion;Energy conservation;Taping;Functional mobility training    PT Next Visit Plan  LE Strengthening; balance exercises (ankle strategies).   Progress HEP    PT Home Exercise Plan  See HEP    Consulted and Agree with Plan of Care  Patient       Patient will benefit from skilled therapeutic intervention in order to improve the following deficits and impairments:  Abnormal gait, Decreased balance, Decreased endurance, Decreased mobility, Difficulty walking, Hypomobility, Increased muscle spasms, Impaired sensation, Decreased range of motion, Improper body mechanics, Decreased activity tolerance, Decreased coordination, Decreased strength, Impaired flexibility, Postural dysfunction,  Pain  Visit Diagnosis: 1. Balance problem   2. Gait abnormality   3. Muscle weakness (generalized)        Problem List Patient Active Problem List   Diagnosis Date Noted  . Hyponatremia 01/26/2019  . ETOH abuse 01/26/2019  . Tobacco abuse 01/26/2019   Cammie McgeeMichael C Sherk, PT, DPT # [id number] Temple PaciniHaley Emaree Chiu, SPT 02/06/2019, 3:05 PM  [facility] [address] [place], [place], [address] Phone: [phone]   Fax:  [phone]  Name: Amy Becker MRN: [id number] Date of Birth: 06/25/1951

## 2019-02-08 ENCOUNTER — Ambulatory Visit: Payer: Medicare Other | Admitting: Physical Therapy

## 2019-02-13 ENCOUNTER — Other Ambulatory Visit: Payer: Self-pay

## 2019-02-13 ENCOUNTER — Encounter: Payer: Self-pay | Admitting: Physical Therapy

## 2019-02-13 ENCOUNTER — Ambulatory Visit: Payer: Medicare Other | Admitting: Physical Therapy

## 2019-02-13 DIAGNOSIS — R2689 Other abnormalities of gait and mobility: Secondary | ICD-10-CM | POA: Diagnosis not present

## 2019-02-13 DIAGNOSIS — M6281 Muscle weakness (generalized): Secondary | ICD-10-CM

## 2019-02-13 DIAGNOSIS — R269 Unspecified abnormalities of gait and mobility: Secondary | ICD-10-CM

## 2019-02-13 NOTE — Patient Instructions (Signed)
Access Code: [email or web address]  URL: [email or web address]  Date: 02/13/2019  Prepared by: Dorcas Carrow   Exercises  Seated Heel Raise - 20 reps - 1 sets - 1x daily - 7x weekly  Seated Toe Raise - 20 reps - 1 sets - 1x daily - 7x weekly  Seated Marching with Opposite Shoulder Flexion - 20 reps - 1 sets - 1x daily - 7x weekly

## 2019-02-13 NOTE — Therapy (Signed)
[facility] [address] [place], [place], [address] Phone: [phone]   Fax:  [phone]  Physical Therapy Treatment  Patient Details  Name: Amy Becker MRN: [id number] Date of Birth: 08-Sep-1950 Referring Provider (PT): Cleotis Lema, MD   Encounter Date: 02/13/2019  PT End of Session - 02/13/19 1423    Visit Number  2    Number of Visits  16    Date for PT Re-Evaluation  04/03/19    Authorization - Visit Number  2    Authorization - Number of Visits  10    PT Start Time  5643    PT Stop Time  1131    PT Time Calculation (min)  40 min    Equipment Utilized During Treatment  Gait belt    Activity Tolerance  Patient tolerated treatment well    Behavior During Therapy  Agitated;WFL for tasks assessed/performed       Past Medical History:  Diagnosis Date  . Alcohol abuse   . Hypertension   . Hyponatremia 01/26/2019    Past Surgical History:  Procedure Laterality Date  . ABDOMINAL HYSTERECTOMY      There were no vitals filed for this visit.  Subjective Assessment - 02/13/19 1419    Subjective  Pt reports she fell on hardwood floors at home after previous session and that she hit her head and low back. Pt reports she went to urgent care immediately after. Pt states she has "bump" on back L-side of her head. Pt reports doing home exercises. Pt reports she has been working since 3:30 this morning and is tired. Pt reports she has not been sleeping well because she wakes up frequently to urinate.    Patient is accompained by:  Family member    Pertinent History  Pt reports she went to the hospital on the 9th because her sodium was low. Pt's son present. Pt's son reports sodium normalized while pt was at hospital. Pt states her BP has been "sky high" and not well controlled. Pt states she has difficulties with grip and gait. Pt reports problems with her "stride," and that she feels like she is going to fall. Pt reports  she lives with son and spouse. Pt reports she is afraid of falling and has husband monitor her in bathroom in case she loses balance. Pt reports difficulty getting up from toilet or out of the shower. Pt reports her legs feel weak. Pt reports she feels stressed often. Pt reports GI issues and diarrhea last week. Pt reports she was tested for COVID and is negative. Pt states she "is not mobile" and is not going outside to walk. Pt reports her goal is that she would like to get back to walking and performing household ADLs like mopping and gardening. Pt reports history of neck pain with shoulder pain, and that pain is 1/10 today and 0/10 at its best and 4/10 at its worst. Pt has headaches that radiate from R side of neck. Pt reports history of chronic back pain (since 68 yo). Pt states she Pt reports numbness in L anterior thigh that she reports has been for years. Pt reports she falls backward while walking and "just moving around." Pt reports she loses balance when she bends forward. Pt reports she has fallen 4 times in last 6 months but not last week or two.    Limitations  Walking;House hold activities;Standing    Patient Stated Goals  Complete household chores/  garden    Currently in Pain?  Yes    Pain Location  Back    Pain Orientation  Lower    Pain Descriptors / Indicators  Aching       Seated BP: 141/64 HR 72 HR with activity: 86 SPO2% with activity: 97%  Therapeutic exercise: Nustep level 6 x10 min Standing marches x 8. With pt request to stop exercise. Standing hip extension - x10 R x8 L. Pt reports increased LBP that resolved with rest Seated heel raises x15, x8 Seated dorsiflexion x18 Seated hip flexion - x20  Neuro: Standing semi-tandem even surface - 2x30 sec with UE support Foam semi tandem - 2x30 sec B with UE support Modified SLS from foam to step - 2x30 sec B    PT Education - 02/13/19 1422    Education Details  Pt educated on benefits of activity and performing  exercises to increase strength and balance    Person(s) Educated  Patient    Methods  Explanation;Demonstration;Handout    Comprehension  Verbalized understanding;Returned demonstration          PT Long Term Goals - 02/06/19 1408      PT LONG TERM GOAL #1   Title  Pt FOTO score will increase from 28 to 48 to improve functional mobility    Baseline  Pt score 28 02/06/2019    Time  8    Period  Weeks    Status  New    Target Date  04/03/19      PT LONG TERM GOAL #2   Title  Pt Berg score will be at least 48/56 to decrease fall risk    Baseline  Pt Berg score is 37 02/06/2019    Time  8    Period  Weeks    Status  New    Target Date  04/03/19      PT LONG TERM GOAL #3   Title  Pt will be able to perform 5xSTS in 12 seconds or less with UE support    Baseline  16.35 seconds with UE support 02/06/2019    Time  4    Period  Weeks    Status  New    Target Date  03/06/19      PT LONG TERM GOAL #4   Title  Pt will increase LE MMT scores by at least 1/2 a grade to improve LE strength and safety with gait    Baseline  Hip flexion R/L: 4/5 B; Knee extension R/L: 4/5 painful, no attempt on L; Knee flexion R/L: 3+/5 B  DF R/L: 4+/5;  PF R/L: 4+/5; Hip abduction R/L 4+/5; hip adduction: R/L 4/5; Hip extension R/L: 4/5 B 02/06/2019    Time  4    Period  Weeks    Status  New    Target Date  03/06/19      PT LONG TERM GOAL #5   Title  Pt will decrease TUG time to 15 seconds or less for improved mobility and safety with ADLs such as toileting and getting in and out of the shower.    Baseline  TUG 22.08 sec 02/06/2019    Time  8    Period  Weeks    Status  New    Target Date  04/03/19            Plan - 02/13/19 1425    Clinical Impression Statement  Pt reports agitation, fatigue and increased LBP with all exercises today, and reports  LBP onset after fall at home last week. Pt denies performing more than one set per strength exercise with incomplete repetitions per set. Pt unable to  complete repetitions possibly due to both decreased LE strength and reported feelings of agitation. Pt requested stopping therapy session early today stating she wanted to "stop because I feel weak" because she "has not had enough to eat" prior to therapy.    Personal Factors and Comorbidities  Age    Examination-Activity Limitations  Bathing;Bed Mobility;Bend;Hygiene/Grooming;Locomotion Level;Reach Overhead;Squat;Stairs;Stand;Toileting    Examination-Participation Restrictions  Cleaning;Community Activity;Laundry;Yard Work    Conservation officer, historic buildingstability/Clinical Decision Making  Evolving/Moderate complexity    Clinical Decision Making  Moderate    Rehab Potential  Good    PT Frequency  2x / week    PT Duration  8 weeks    PT Treatment/Interventions  ADLs/Self Care Home Management;Traction;Cryotherapy;Electrical Stimulation;DME Instruction;Gait Network engineertraining;Stair training;Therapeutic activities;Therapeutic exercise;Balance training;Neuromuscular re-education;Patient/family education;Manual techniques;Passive range of motion;Energy conservation;Taping;Functional mobility training    PT Next Visit Plan  Progress LE strengthening, balance    PT Home Exercise Plan  See HEP    Consulted and Agree with Plan of Care  Patient       Patient will benefit from skilled therapeutic intervention in order to improve the following deficits and impairments:  Abnormal gait, Decreased balance, Decreased endurance, Decreased mobility, Difficulty walking, Hypomobility, Increased muscle spasms, Impaired sensation, Decreased range of motion, Improper body mechanics, Decreased activity tolerance, Decreased coordination, Decreased strength, Impaired flexibility, Postural dysfunction, Pain  Visit Diagnosis: 1. Balance problem   2. Gait abnormality   3. Muscle weakness (generalized)        Problem List Patient Active Problem List   Diagnosis Date Noted  . Hyponatremia 01/26/2019  . ETOH abuse 01/26/2019  . Tobacco abuse 01/26/2019    Cammie McgeeMichael C Sherk, PT, DPT # [id number] Temple PaciniHaley Eeshan [place], SPT 02/13/2019, 3:10 PM  [facility] [address] [place], [place], [address] Phone: [phone]   Fax:  [phone]  Name: Amy Becker MRN: [id number] Date of Birth: 10/04/1950

## 2019-02-15 ENCOUNTER — Ambulatory Visit: Payer: Medicare Other | Admitting: Physical Therapy

## 2019-02-15 ENCOUNTER — Encounter: Payer: Self-pay | Admitting: Physical Therapy

## 2019-02-15 ENCOUNTER — Other Ambulatory Visit: Payer: Self-pay

## 2019-02-15 DIAGNOSIS — R269 Unspecified abnormalities of gait and mobility: Secondary | ICD-10-CM

## 2019-02-15 DIAGNOSIS — M6281 Muscle weakness (generalized): Secondary | ICD-10-CM

## 2019-02-15 DIAGNOSIS — R2689 Other abnormalities of gait and mobility: Secondary | ICD-10-CM | POA: Diagnosis not present

## 2019-02-15 NOTE — Therapy (Signed)
[facility] [address] [place], [place], [address] Phone: [phone]   Fax:  [phone]  Physical Therapy Treatment  Patient Details  Name: Amy Becker MRN: [id number] Date of Birth: 03/23/1951 Referring Provider (PT): Orson AloeJeffrey David Clough, MD   Encounter Date: 02/15/2019  PT End of Session - 02/15/19 1326    Visit Number  3    Number of Visits  16    Date for PT Re-Evaluation  04/03/19    Authorization - Visit Number  3    Authorization - Number of Visits  10    PT Start Time  1101    PT Stop Time  1148    PT Time Calculation (min)  47 min    Equipment Utilized During Treatment  Gait belt    Activity Tolerance  Patient tolerated treatment well;Patient limited by pain    Behavior During Therapy  [facility] for tasks assessed/performed       Past Medical History:  Diagnosis Date  . Alcohol abuse   . Hypertension   . Hyponatremia 01/26/2019    Past Surgical History:  Procedure Laterality Date  . ABDOMINAL HYSTERECTOMY      There were no vitals filed for this visit.  Subjective Assessment - 02/15/19 1323    Subjective  Pt. states her back/head are doing better today but not 100%.  Pt. entered PT in more upbeat mood without use of assistive and husband accompanied.    Patient is accompained by:  Family member    Pertinent History  Pt reports she went to the hospital on the 9th because her sodium was low. Pt's son present. Pt's son reports sodium normalized while pt was at hospital. Pt states her BP has been "sky high" and not well controlled. Pt states she has difficulties with grip and gait. Pt reports problems with her "stride," and that she feels like she is going to fall. Pt reports she lives with son and spouse. Pt reports she is afraid of falling and has husband monitor her in bathroom in case she loses balance. Pt reports difficulty getting up from toilet or out of the shower. Pt reports her legs feel weak. Pt reports  she feels stressed often. Pt reports GI issues and diarrhea last week. Pt reports she was tested for COVID and is negative. Pt states she "is not mobile" and is not going outside to walk. Pt reports her goal is that she would like to get back to walking and performing household ADLs like mopping and gardening. Pt reports history of neck pain with shoulder pain, and that pain is 1/10 today and 0/10 at its best and 4/10 at its worst. Pt has headaches that radiate from R side of neck. Pt reports history of chronic back pain (since 68 yo). Pt states she Pt reports numbness in L anterior thigh that she reports has been for years. Pt reports she falls backward while walking and "just moving around." Pt reports she loses balance when she bends forward. Pt reports she has fallen 4 times in last 6 months but not last week or two.    Limitations  Walking;House hold activities;Standing    Patient Stated Goals  Complete household chores/ garden    Currently in Pain?  No/denies         There.ex.:  Seated marching/ LAQ/ heel raises 20x (mod. Cuing/ instruction to remain focused and on task) Seated alt. UE/LE with posture correction 10x.   Nustep L5  5 min. B UE/LE (no rest breaks today but fatigue noted)  Neuro. Mm.:  Walking in //-bars forward/ backwards/ lateral 4x each (mirror feedback) Tandem stance/ walking (forward/backwards) in //-bars with mod. PT assist.  Pt. Requires R UE assist on //-bars for safety.  Pt. Fearful of falling and frequently requests PT hold gait belt tighter. Standing cone taps L/R in //-bars Functional reaching with cones (varying levels with SPT assist) Walking on agility ladder with cuing to increase step length/ avoid blue lines and maintain proper BOS Turning CW/ CCW without UE assist or device  Pt. Much more responsive with PT tx. Session today as compared to last tx. Session.     PT Long Term Goals - 02/06/19 1408      PT LONG TERM GOAL #1   Title  Pt FOTO score will  increase from 28 to 48 to improve functional mobility    Baseline  Pt score 28 02/06/2019    Time  8    Period  Weeks    Status  New    Target Date  04/03/19      PT LONG TERM GOAL #2   Title  Pt Berg score will be at least 48/56 to decrease fall risk    Baseline  Pt Berg score is 37 02/06/2019    Time  8    Period  Weeks    Status  New    Target Date  04/03/19      PT LONG TERM GOAL #3   Title  Pt will be able to perform 5xSTS in 12 seconds or less with UE support    Baseline  16.35 seconds with UE support 02/06/2019    Time  4    Period  Weeks    Status  New    Target Date  03/06/19      PT LONG TERM GOAL #4   Title  Pt will increase LE MMT scores by at least 1/2 a grade to improve LE strength and safety with gait    Baseline  Hip flexion R/L: 4/5 B; Knee extension R/L: 4/5 painful, no attempt on L; Knee flexion R/L: 3+/5 B  DF R/L: 4+/5;  PF R/L: 4+/5; Hip abduction R/L 4+/5; hip adduction: R/L 4/5; Hip extension R/L: 4/5 B 02/06/2019    Time  4    Period  Weeks    Status  New    Target Date  03/06/19      PT LONG TERM GOAL #5   Title  Pt will decrease TUG time to 15 seconds or less for improved mobility and safety with ADLs such as toileting and getting in and out of the shower.    Baseline  TUG 22.08 sec 02/06/2019    Time  8    Period  Weeks    Status  New    Target Date  04/03/19            Plan - 02/15/19 1326    Clinical Impression Statement  Pt. was only limited by low back pain during sit to stand ex. with use of Airex to increase seat height.  Low back pain resolved quickly with standing posture/ position changes.  Pt. requires max. cuing/ attention t/o tx. session to remain focused and complete ther.ex./ balance ex.  Pt. able to turn without issue and demonstrate more consistent step pattern with increase step length as compared to last tx. session.  Pt. remains fear limited and frequently request PT assist on  gait belt during tandem stance/ gait.  Light UE  assist required with tandem stance and cone taps in //-bars.  Pt. unable to complete higher level balance tasks without UE assist at this time.  No rest break required today with 5 minutes on Nustep.  No change to HEP at this time.    Personal Factors and Comorbidities  Age    Examination-Activity Limitations  Bathing;Bed Mobility;Bend;Hygiene/Grooming;Locomotion Level;Reach Overhead;Squat;Stairs;Stand;Toileting    Examination-Participation Restrictions  Cleaning;Community Activity;Laundry;Yard Work    Conservation officer, historic buildingstability/Clinical Decision Making  Evolving/Moderate complexity    Clinical Decision Making  Moderate    Rehab Potential  Good    PT Frequency  2x / week    PT Duration  8 weeks    PT Treatment/Interventions  ADLs/Self Care Home Management;Traction;Cryotherapy;Electrical Stimulation;DME Instruction;Gait Network engineertraining;Stair training;Therapeutic activities;Therapeutic exercise;Balance training;Neuromuscular re-education;Patient/family education;Manual techniques;Passive range of motion;Energy conservation;Taping;Functional mobility training    PT Next Visit Plan  Progress LE strengthening, balance    PT Home Exercise Plan  See HEP    Consulted and Agree with Plan of Care  Patient       Patient will benefit from skilled therapeutic intervention in order to improve the following deficits and impairments:  Abnormal gait, Decreased balance, Decreased endurance, Decreased mobility, Difficulty walking, Hypomobility, Increased muscle spasms, Impaired sensation, Decreased range of motion, Improper body mechanics, Decreased activity tolerance, Decreased coordination, Decreased strength, Impaired flexibility, Postural dysfunction, Pain  Visit Diagnosis: 1. Balance problem   2. Gait abnormality   3. Muscle weakness (generalized)        Problem List Patient Active Problem List   Diagnosis Date Noted  . Hyponatremia 01/26/2019  . ETOH abuse 01/26/2019  . Tobacco abuse 01/26/2019   Cammie McgeeMichael C Quisha Mabie, PT,  DPT # [id number] 02/15/2019, 1:33 PM   [facility] [address] [place], [place], [address] Phone: [phone]   Fax:  [phone]  Name: Amy Becker MRN: [id number] Date of Birth: 11/19/1950

## 2019-02-20 ENCOUNTER — Other Ambulatory Visit: Payer: Self-pay

## 2019-02-20 ENCOUNTER — Encounter: Payer: Self-pay | Admitting: Physical Therapy

## 2019-02-20 ENCOUNTER — Ambulatory Visit: Payer: Medicare Other | Attending: Internal Medicine | Admitting: Physical Therapy

## 2019-02-20 DIAGNOSIS — R269 Unspecified abnormalities of gait and mobility: Secondary | ICD-10-CM | POA: Insufficient documentation

## 2019-02-20 DIAGNOSIS — M6281 Muscle weakness (generalized): Secondary | ICD-10-CM | POA: Diagnosis present

## 2019-02-20 DIAGNOSIS — R2689 Other abnormalities of gait and mobility: Secondary | ICD-10-CM | POA: Diagnosis present

## 2019-02-20 NOTE — Therapy (Signed)
[place] [facility] [address] [place], [place], [address] Phone: [phone]   Fax:  [phone]  Physical Therapy Treatment  Patient Details  Name: Amy Becker Dattilio MRN: [id number] Date of Birth: 09/09/1950 Referring Provider (PT): Orson AloeJeffrey David Clough, MD   Encounter Date: 02/20/2019  PT End of Session - 02/20/19 1252    Visit Number  4    Number of Visits  16    Date for PT Re-Evaluation  04/03/19    Authorization - Visit Number  4    Authorization - Number of Visits  10    PT Start Time  1117    PT Stop Time  1203    PT Time Calculation (min)  46 min    Equipment Utilized During Treatment  Gait belt    Activity Tolerance  Patient tolerated treatment well;Patient limited by pain    Behavior During Therapy  [facility] for tasks assessed/performed       Past Medical History:  Diagnosis Date  . Alcohol abuse   . Hypertension   . Hyponatremia 01/26/2019    Past Surgical History:  Procedure Laterality Date  . ABDOMINAL HYSTERECTOMY      There were no vitals filed for this visit.  Subjective Assessment - 02/20/19 1247    Subjective  Pt reports she had a relaxing Sunday. Pt reports she did a lot of cleaning with son and husband over weekend for activity. Pt reports her low back hurts and both sides of neck hurt since her previous fall at home. Pt reports low back pain increases when she stands up from the toilet. Pt reports she has appointment with primary care doctor this Wednesday and is going to discuss swelling in her ankles.    Patient is accompained by:  Family member    Pertinent History  Pt reports she had a relaxing Sunday. Pt reports she did a lot of cleaning with son and husband. Pt reports LBP and that B neck/shoulders hurt today and states it is same pain she has had since previous fall. Pt reports that low back hurts when getting up from toilet. Pt does not provide pain rating. Pt reports she has appointment with primary care  doctor this Wednesday and is going to discuss swelling in her ankles.    Limitations  Walking;House hold activities;Standing    Patient Stated Goals  Complete household chores/ garden    Currently in Pain?  Yes    Pain Location  Back    Pain Orientation  Right;Left;Lower        Exercising BP:  130/63 HR 70   Therapeutic exercise: 41 min therex Seated chin tucks 2x15. Tactile cuing provided for technique. Pt reports reduction in neck/shoulder pain Seated RTB shoulder ER - 2x15 Seated RTB rows 2x15 Seated marching 2x20 Seated LAQ 2x20  Seated hip adduction 2x10. Cuing provided for technique. Pt reports exercise "feels good on low back." Seated manually resisted heel slides with RTB 2x10 B Supine ankle pumps x20 B Supine manually resisted PF 2x15 B   PT Education - 02/20/19 1251    Education Details  Pt educated on techniques to reduce B ankle swelling.    Person(s) Educated  Patient    Methods  Explanation;Demonstration    Comprehension  Verbalized understanding;Returned demonstration          PT Long Term Goals - 02/06/19 1408      PT LONG TERM GOAL #1   Title  Pt FOTO score will  increase from 28 to 48 to improve functional mobility    Baseline  Pt score 28 02/06/2019    Time  8    Period  Weeks    Status  New    Target Date  04/03/19      PT LONG TERM GOAL #2   Title  Pt Berg score will be at least 48/56 to decrease fall risk    Baseline  Pt Berg score is 37 02/06/2019    Time  8    Period  Weeks    Status  New    Target Date  04/03/19      PT LONG TERM GOAL #3   Title  Pt will be able to perform 5xSTS in 12 seconds or less with UE support    Baseline  16.35 seconds with UE support 02/06/2019    Time  4    Period  Weeks    Status  New    Target Date  03/06/19      PT LONG TERM GOAL #4   Title  Pt will increase LE MMT scores by at least 1/2 a grade to improve LE strength and safety with gait    Baseline  Hip flexion R/L: 4/5 B; Knee extension R/L: 4/5  painful, no attempt on L; Knee flexion R/L: 3+/5 B  DF R/L: 4+/5;  PF R/L: 4+/5; Hip abduction R/L 4+/5; hip adduction: R/L 4/5; Hip extension R/L: 4/5 B 02/06/2019    Time  4    Period  Weeks    Status  New    Target Date  03/06/19      PT LONG TERM GOAL #5   Title  Pt will decrease TUG time to 15 seconds or less for improved mobility and safety with ADLs such as toileting and getting in and out of the shower.    Baseline  TUG 22.08 sec 02/06/2019    Time  8    Period  Weeks    Status  New    Target Date  04/03/19            Plan - 02/20/19 1258    Clinical Impression Statement  Pt enthusiastic to participate in therapy today and completed full reps and sets. Pt continues to require frequent cuing for attention to complete exercises. Pt reported improvement in shoulder/neck and low back pain with chin tuck, shoulder RTB ER, and seated hip adduction exercises. Pt able to perform more reps of each exercise today, but reported increased fatigue of LE musculature and required prolonged rest breaks, indicating decreased LE strength and endurance. Pt with increase in B ankle swelling today and purple/red appearance of feet. When pt placed in supine position color of B feet improved and pt reported B LEs "felt better." Pt instructed to follow up with physician at her appointment this Wednesday regarding B ankle/feet swelling and pt confirmed understanding. Pt will continue to benefit from further skilled therapy to increase LE strength and improve balance.    Personal Factors and Comorbidities  Age    Examination-Activity Limitations  Bathing;Bed Mobility;Bend;Hygiene/Grooming;Locomotion Level;Reach Overhead;Squat;Stairs;Stand;Toileting    Examination-Participation Restrictions  Cleaning;Community Activity;Laundry;Yard Work    Merchant navy officer  Evolving/Moderate complexity    Clinical Decision Making  Moderate    Rehab Potential  Good    PT Frequency  2x / week    PT Duration   8 weeks    PT Treatment/Interventions  ADLs/Self Care Home Management;Traction;Cryotherapy;Electrical Stimulation;DME Instruction;Gait Scientist, forensic;Therapeutic activities;Therapeutic exercise;Balance training;Neuromuscular  re-education;Patient/family education;Manual techniques;Passive range of motion;Energy conservation;Taping;Functional mobility training    PT Next Visit Plan  Progress LE strengthening, balance: ankle/hip/stepping strategies    PT Home Exercise Plan  See HEP    Consulted and Agree with Plan of Care  Patient       Patient will benefit from skilled therapeutic intervention in order to improve the following deficits and impairments:  Abnormal gait, Decreased balance, Decreased endurance, Decreased mobility, Difficulty walking, Hypomobility, Increased muscle spasms, Impaired sensation, Decreased range of motion, Improper body mechanics, Decreased activity tolerance, Decreased coordination, Decreased strength, Impaired flexibility, Postural dysfunction, Pain  Visit Diagnosis: 1. Balance problem   2. Gait abnormality   3. Muscle weakness (generalized)        Problem List Patient Active Problem List   Diagnosis Date Noted  . Hyponatremia 01/26/2019  . ETOH abuse 01/26/2019  . Tobacco abuse 01/26/2019    Temple PaciniHaley Tatum Massman, SPT FreedomKatlin Harker PT, [place]DPT #[id number]  02/20/2019, 1:09 PM  [place] [facility] [address] [place], [place], [address] Phone: [phone]   Fax:  [phone]  Name: Amy Becker Pablo MRN: [id number] Date of Birth: 10/31/1950

## 2019-02-22 ENCOUNTER — Encounter: Payer: Medicare Other | Admitting: Physical Therapy

## 2019-02-24 ENCOUNTER — Other Ambulatory Visit: Payer: Self-pay

## 2019-02-24 ENCOUNTER — Encounter: Payer: Self-pay | Admitting: Physical Therapy

## 2019-02-24 ENCOUNTER — Ambulatory Visit: Payer: Medicare Other | Admitting: Physical Therapy

## 2019-02-24 DIAGNOSIS — R269 Unspecified abnormalities of gait and mobility: Secondary | ICD-10-CM

## 2019-02-24 DIAGNOSIS — R2689 Other abnormalities of gait and mobility: Secondary | ICD-10-CM

## 2019-02-24 DIAGNOSIS — M6281 Muscle weakness (generalized): Secondary | ICD-10-CM

## 2019-02-24 NOTE — Therapy (Addendum)
[place] [facility] [address] [place], [place], [address] Phone: [phone]   Fax:  [phone]  Physical Therapy Treatment  Patient Details  Name: Amy Becker K Bellin MRN: [id number] Date of Birth: 08/08/1950 Referring Provider (PT): Orson AloeJeffrey David Clough, MD   Encounter Date: 02/24/2019  PT End of Session - 02/27/19 0730    Visit Number  5    Number of Visits  16    Date for PT Re-Evaluation  04/03/19    Authorization - Visit Number  5    Authorization - Number of Visits  10    PT Start Time  1102    PT Stop Time  1155    PT Time Calculation (min)  53 min    Equipment Utilized During Treatment  Gait belt    Activity Tolerance  Patient tolerated treatment well;Patient limited by pain    Behavior During Therapy  [facility] for tasks assessed/performed       Past Medical History:  Diagnosis Date  . Alcohol abuse   . Hypertension   . Hyponatremia 01/26/2019    Past Surgical History:  Procedure Laterality Date  . ABDOMINAL HYSTERECTOMY      There were no vitals filed for this visit.  Subjective Assessment - 02/27/19 0727    Subjective  Pt. feels she is doing better with walking.  No fall reported over past week.  Pt. not c/o low back pain at this time.    Patient is accompained by:  Family member    Pertinent History  Pt reports she had a relaxing Sunday. Pt reports she did a lot of cleaning with son and husband. Pt reports LBP and that B neck/shoulders hurt today and states it is same pain she has had since previous fall. Pt reports that low back hurts when getting up from toilet. Pt does not provide pain rating. Pt reports she has appointment with primary care doctor this Wednesday and is going to discuss swelling in her ankles.    Limitations  Walking;House hold activities;Standing    Patient Stated Goals  Complete household chores/ garden    Currently in Pain?  No/denies        Therapeutic exercise:   Reviewed HEP Nustep L5 7.5  min. B UE/LE (fatigue reported). Seated marching 2x20 Seated LAQ 2x20  Seated hip adduction with ball 2x10.   Seated manually resisted heel slides with RTB 2x10 B Seated heel/ toe raises   Neuro:  Forward/ backward/ lateral walking in //-bars with light to no UE assist.   Agility ladder (increase step length/ heel strike/ upright posture)- maneuvering cones/ turning/ cone taps (forward/ lateral walking) Step ups/ downs and Repetitive step touches with no UE assist.   Walking in hallway/ outside on varying terrain.     PT Long Term Goals - 02/06/19 1408      PT LONG TERM GOAL #1   Title  Pt FOTO score will increase from 28 to 48 to improve functional mobility    Baseline  Pt score 28 02/06/2019    Time  8    Period  Weeks    Status  New    Target Date  04/03/19      PT LONG TERM GOAL #2   Title  Pt Berg score will be at least 48/56 to decrease fall risk    Baseline  Pt Berg score is 37 02/06/2019    Time  8    Period  Weeks  Status  New    Target Date  04/03/19      PT LONG TERM GOAL #3   Title  Pt will be able to perform 5xSTS in 12 seconds or less with UE support    Baseline  16.35 seconds with UE support 02/06/2019    Time  4    Period  Weeks    Status  New    Target Date  03/06/19      PT LONG TERM GOAL #4   Title  Pt will increase LE MMT scores by at least 1/2 a grade to improve LE strength and safety with gait    Baseline  Hip flexion R/L: 4/5 B; Knee extension R/L: 4/5 painful, no attempt on L; Knee flexion R/L: 3+/5 B  DF R/L: 4+/5;  PF R/L: 4+/5; Hip abduction R/L 4+/5; hip adduction: R/L 4/5; Hip extension R/L: 4/5 B 02/06/2019    Time  4    Period  Weeks    Status  New    Target Date  03/06/19      PT LONG TERM GOAL #5   Title  Pt will decrease TUG time to 15 seconds or less for improved mobility and safety with ADLs such as toileting and getting in and out of the shower.    Baseline  TUG 22.08 sec 02/06/2019    Time  8    Period  Weeks    Status  New     Target Date  04/03/19            Plan - 02/27/19 0730    Clinical Impression Statement  Pt. requires mod. to max. cuing with instruction to complete standing therex./ balance tasks.  Pt. requires CGA for safety with higher level balance tasks/ maneuvering cones.  PT discussed B LE swelling in ankles/ feet and use of flip flops with everyday walking.  Pt. very fatigued at end of tx. session and benefited from short seated rest break prior to walking to car.    Personal Factors and Comorbidities  Age    Examination-Activity Limitations  Bathing;Bed Mobility;Bend;Hygiene/Grooming;Locomotion Level;Reach Overhead;Squat;Stairs;Stand;Toileting    Examination-Participation Restrictions  Cleaning;Community Activity;Laundry;Yard Work    Merchant navy officer  Evolving/Moderate complexity    Clinical Decision Making  Moderate    Rehab Potential  Good    PT Frequency  2x / week    PT Duration  8 weeks    PT Treatment/Interventions  ADLs/Self Care Home Management;Traction;Cryotherapy;Electrical Stimulation;DME Instruction;Gait Scientist, forensic;Therapeutic activities;Therapeutic exercise;Balance training;Neuromuscular re-education;Patient/family education;Manual techniques;Passive range of motion;Energy conservation;Taping;Functional mobility training    PT Next Visit Plan  Progress LE strengthening, balance: ankle/hip/stepping strategies    PT Home Exercise Plan  See HEP    Consulted and Agree with Plan of Care  Patient       Patient will benefit from skilled therapeutic intervention in order to improve the following deficits and impairments:  Abnormal gait, Decreased balance, Decreased endurance, Decreased mobility, Difficulty walking, Hypomobility, Increased muscle spasms, Impaired sensation, Decreased range of motion, Improper body mechanics, Decreased activity tolerance, Decreased coordination, Decreased strength, Impaired flexibility, Postural dysfunction, Pain  Visit  Diagnosis: 1. Balance problem   2. Gait abnormality   3. Muscle weakness (generalized)        Problem List Patient Active Problem List   Diagnosis Date Noted  . Hyponatremia 01/26/2019  . ETOH abuse 01/26/2019  . Tobacco abuse 01/26/2019   Pura Spice, PT, DPT # [id number] 02/27/2019, 7:34 AM  [place]  [facility] [address] [place], [place], [address] Phone: [phone]   Fax:  [phone]  Name: Amy Becker K Amy Becker MRN: [id number] Date of Birth: 11/04/1950

## 2019-02-24 NOTE — Patient Instructions (Signed)
Access Code: [email or web address]  URL: [email or web address]  Date: 02/24/2019  Prepared by: Dorcas Carrow   Exercises  Seated Cervical Retraction - 10 reps - 2 sets - 1x daily - 4x weekly  Seated Shoulder Row with Anchored Resistance - 10 reps - 2 sets - 1x daily - 4x weekly  Seated Bilateral Shoulder External Rotation with Resistance - 10 reps - 2 sets - 1x daily - 4x weekly

## 2019-02-27 ENCOUNTER — Ambulatory Visit: Payer: Medicare Other | Admitting: Physical Therapy

## 2019-03-01 ENCOUNTER — Other Ambulatory Visit: Payer: Self-pay

## 2019-03-01 ENCOUNTER — Encounter: Payer: Self-pay | Admitting: Physical Therapy

## 2019-03-01 ENCOUNTER — Ambulatory Visit: Payer: Medicare Other | Admitting: Physical Therapy

## 2019-03-01 DIAGNOSIS — M6281 Muscle weakness (generalized): Secondary | ICD-10-CM

## 2019-03-01 DIAGNOSIS — R2689 Other abnormalities of gait and mobility: Secondary | ICD-10-CM

## 2019-03-01 DIAGNOSIS — R269 Unspecified abnormalities of gait and mobility: Secondary | ICD-10-CM

## 2019-03-01 NOTE — Therapy (Signed)
[facility] [address] [place], [place], [address] Phone: [phone]   Fax:  [phone]  Physical Therapy Treatment  Patient Details  Name: Elane Fritzrudy K Gracy MRN: [id number] Date of Birth: 11/18/1950 Referring Provider (PT): Orson AloeJeffrey David Clough, MD   Encounter Date: 03/01/2019  PT End of Session - 03/01/19 1508    Visit Number  6    Number of Visits  16    Date for PT Re-Evaluation  04/03/19    Authorization - Visit Number  6    Authorization - Number of Visits  10    PT Start Time  1105    PT Stop Time  1146    PT Time Calculation (min)  41 min    Equipment Utilized During Treatment  Gait belt    Activity Tolerance  Patient tolerated treatment well;Patient limited by pain    Behavior During Therapy  [facility] for tasks assessed/performed       Past Medical History:  Diagnosis Date  . Alcohol abuse   . Hypertension   . Hyponatremia 01/26/2019    Past Surgical History:  Procedure Laterality Date  . ABDOMINAL HYSTERECTOMY      There were no vitals filed for this visit.  Subjective Assessment - 03/01/19 1505    Subjective  Pt reports 0/10 pain today. Pt reports performing HEP and that she feels her walking is "steadier."    Patient is accompained by:  Family member    Pertinent History  Pt reports she had a relaxing Sunday. Pt reports she did a lot of cleaning with son and husband. Pt reports LBP and that B neck/shoulders hurt today and states it is same pain she has had since previous fall. Pt reports that low back hurts when getting up from toilet. Pt does not provide pain rating. Pt reports she has appointment with primary care doctor this Wednesday and is going to discuss swelling in her ankles.    Limitations  Walking;House hold activities;Standing    Patient Stated Goals  Complete household chores/ garden    Currently in Pain?  No/denies       Therapeutic Exercise:  Nustep L6, 10 min Glute bridges - 2x15 SLR -  2x10 B. Pt with increased ROM compared to previous session Ankle pumps - 25x B Manually reisted PF - 2x15B LEs Supine adductor squeeze wth ball and 3 second isometric 12x Seated rows with GTB - 2x20 Seated external rotation shoulders GTB - 2x20 Standing marches in // bars - 6x back and forth Ambulation around clinic 4x   PT Education - 03/01/19 1506    Education Details  Pt educated on progression of strength exercises and benefits for LE strength/balance    Person(s) Educated  Patient    Methods  Explanation;Demonstration    Comprehension  Verbalized understanding;Returned demonstration          PT Long Term Goals - 02/06/19 1408      PT LONG TERM GOAL #1   Title  Pt FOTO score will increase from 28 to 48 to improve functional mobility    Baseline  Pt score 28 02/06/2019    Time  8    Period  Weeks    Status  New    Target Date  04/03/19      PT LONG TERM GOAL #2   Title  Pt Berg score will be at least 48/56 to decrease fall risk    Baseline  Pt Berg score is 37  02/06/2019    Time  8    Period  Weeks    Status  New    Target Date  04/03/19      PT LONG TERM GOAL #3   Title  Pt will be able to perform 5xSTS in 12 seconds or less with UE support    Baseline  16.35 seconds with UE support 02/06/2019    Time  4    Period  Weeks    Status  New    Target Date  03/06/19      PT LONG TERM GOAL #4   Title  Pt will increase LE MMT scores by at least 1/2 a grade to improve LE strength and safety with gait    Baseline  Hip flexion R/L: 4/5 B; Knee extension R/L: 4/5 painful, no attempt on L; Knee flexion R/L: 3+/5 B  DF R/L: 4+/5;  PF R/L: 4+/5; Hip abduction R/L 4+/5; hip adduction: R/L 4/5; Hip extension R/L: 4/5 B 02/06/2019    Time  4    Period  Weeks    Status  New    Target Date  03/06/19      PT LONG TERM GOAL #5   Title  Pt will decrease TUG time to 15 seconds or less for improved mobility and safety with ADLs such as toileting and getting in and out of the shower.     Baseline  TUG 22.08 sec 02/06/2019    Time  8    Period  Weeks    Status  New    Target Date  04/03/19            Plan - 03/01/19 1337    Clinical Impression Statement  Pt demonstrates improvement in therapy today with completing SLR on B LEs with greater ROM and with more reps compared to previous session, indcating increased B hip flexor strength. Pt able to progress to GTB with seated shoulder exercises, but for decreased reps, indicating decreased strength of shoulder musculature. Pt also with increased gait speed today, but continues to require cuing for increased step length for B LEs and for posture. Pt will continue to benefit from further skilled therapy to increase B LE strength and decrease risk of falls.    Personal Factors and Comorbidities  Age    Examination-Activity Limitations  Bathing;Bed Mobility;Bend;Hygiene/Grooming;Locomotion Level;Reach Overhead;Squat;Stairs;Stand;Toileting    Examination-Participation Restrictions  Cleaning;Community Activity;Laundry;Yard Work    Conservation officer, historic buildingstability/Clinical Decision Making  Evolving/Moderate complexity    Rehab Potential  Good    PT Frequency  2x / week    PT Duration  8 weeks    PT Treatment/Interventions  ADLs/Self Care Home Management;Traction;Cryotherapy;Electrical Stimulation;DME Instruction;Gait Network engineertraining;Stair training;Therapeutic activities;Therapeutic exercise;Balance training;Neuromuscular re-education;Patient/family education;Manual techniques;Passive range of motion;Energy conservation;Taping;Functional mobility training    PT Next Visit Plan  Progress LE strengthening, balance: ankle/hip/stepping strategies    PT Home Exercise Plan  See HEP    Consulted and Agree with Plan of Care  Patient       Patient will benefit from skilled therapeutic intervention in order to improve the following deficits and impairments:  Abnormal gait, Decreased balance, Decreased endurance, Decreased mobility, Difficulty walking, Hypomobility,  Increased muscle spasms, Impaired sensation, Decreased range of motion, Improper body mechanics, Decreased activity tolerance, Decreased coordination, Decreased strength, Impaired flexibility, Postural dysfunction, Pain  Visit Diagnosis: 1. Balance problem   2. Gait abnormality   3. Muscle weakness (generalized)        Problem List Patient Active Problem List   Diagnosis  Date Noted  . Hyponatremia 01/26/2019  . ETOH abuse 01/26/2019  . Tobacco abuse 01/26/2019   Pura Spice, PT, DPT # [id number] SPT 03/01/2019, 3:27 PM  [place] [facility] [address] [place], [place], [address] Phone: [phone]   Fax:  [phone]  Name: DEBBRA DIGIULIO MRN: [id number] Date of Birth: Dec 07, 1950

## 2019-03-06 ENCOUNTER — Other Ambulatory Visit: Payer: Self-pay

## 2019-03-06 ENCOUNTER — Ambulatory Visit: Payer: Medicare Other | Admitting: Physical Therapy

## 2019-03-06 ENCOUNTER — Encounter: Payer: Self-pay | Admitting: Physical Therapy

## 2019-03-06 DIAGNOSIS — M6281 Muscle weakness (generalized): Secondary | ICD-10-CM

## 2019-03-06 DIAGNOSIS — R269 Unspecified abnormalities of gait and mobility: Secondary | ICD-10-CM

## 2019-03-06 DIAGNOSIS — R2689 Other abnormalities of gait and mobility: Secondary | ICD-10-CM

## 2019-03-06 NOTE — Therapy (Signed)
[facility] [address] [place], [place], [address] Phone: [phone]   Fax:  [phone]  Physical Therapy Treatment  Patient Details  Name: Amy Fritzrudy K Rape MRN: [id number] Date of Birth: 07/14/1951 Referring Provider (PT): Orson AloeJeffrey David Clough, MD   Encounter Date: 03/06/2019  PT End of Session - 03/06/19 1600    Visit Number  7    Number of Visits  16    Date for PT Re-Evaluation  04/03/19    Authorization - Visit Number  7    Authorization - Number of Visits  10    PT Start Time  1116    PT Stop Time  1203    PT Time Calculation (min)  47 min    Equipment Utilized During Treatment  Gait belt    Activity Tolerance  Patient tolerated treatment well;Patient limited by pain    Behavior During Therapy  [facility] for tasks assessed/performed       Past Medical History:  Diagnosis Date  . Alcohol abuse   . Hypertension   . Hyponatremia 01/26/2019    Past Surgical History:  Procedure Laterality Date  . ABDOMINAL HYSTERECTOMY      There were no vitals filed for this visit.  Subjective Assessment - 03/06/19 1559    Subjective  Pt reports she had cut her BP medication in half, but that after meeting with her physician she is no longer taking BP medication. Pt reports she has not been doing exercises due to ankle swelling. Pt reports no pain today.    Patient is accompained by:  Family member    Pertinent History  Pt reports she had a relaxing Sunday. Pt reports she did a lot of cleaning with son and husband. Pt reports LBP and that B neck/shoulders hurt today and states it is same pain she has had since previous fall. Pt reports that low back hurts when getting up from toilet. Pt does not provide pain rating. Pt reports she has appointment with primary care doctor this Wednesday and is going to discuss swelling in her ankles.    Limitations  Walking;House hold activities;Standing    Patient Stated Goals  Complete household chores/  garden    Currently in Pain?  No/denies      Therapeutic Exercise: Nustep L6, x 10 min  Supine SLR - 10x B Supine ankle pumps - 2x10 B Supine resisted DF/PF - 15x each B Supine glute bridges - 2x10. Cuing provided for technique Seated RTB rows - 2x10 Seated RTB ER - 2x10  Neuro Obstacle course walking around cones/cone taps/stepping over object. - 8x Pt with some difficulty clearing R LE over obstacle, but improved with cuing. Ambulation around clinic with vertical and horizontal head turns - 2x each. Pt with variable BOS using stepping strategyto regain balance Standing on foam, // bars, with horizontal and vertical head turns with intermittent UE support - 2x30 sec each. Pt with greater difficulty with vertical head turns   PT Education - 03/06/19 1600    Education Details  Pt educated on technique with glute bridge exercise    Person(s) Educated  Patient    Methods  Explanation;Demonstration    Comprehension  Verbalized understanding;Returned demonstration          PT Long Term Goals - 02/06/19 1408      PT LONG TERM GOAL #1   Title  Pt FOTO score will increase from 28 to 48 to improve functional mobility    Baseline  Pt score 28 02/06/2019    Time  8    Period  Weeks    Status  New    Target Date  04/03/19      PT LONG TERM GOAL #2   Title  Pt Berg score will be at least 48/56 to decrease fall risk    Baseline  Pt Berg score is 37 02/06/2019    Time  8    Period  Weeks    Status  New    Target Date  04/03/19      PT LONG TERM GOAL #3   Title  Pt will be able to perform 5xSTS in 12 seconds or less with UE support    Baseline  16.35 seconds with UE support 02/06/2019    Time  4    Period  Weeks    Status  New    Target Date  03/06/19      PT LONG TERM GOAL #4   Title  Pt will increase LE MMT scores by at least 1/2 a grade to improve LE strength and safety with gait    Baseline  Hip flexion R/L: 4/5 B; Knee extension R/L: 4/5 painful, no attempt on L; Knee  flexion R/L: 3+/5 B  DF R/L: 4+/5;  PF R/L: 4+/5; Hip abduction R/L 4+/5; hip adduction: R/L 4/5; Hip extension R/L: 4/5 B 02/06/2019    Time  4    Period  Weeks    Status  New    Target Date  03/06/19      PT LONG TERM GOAL #5   Title  Pt will decrease TUG time to 15 seconds or less for improved mobility and safety with ADLs such as toileting and getting in and out of the shower.    Baseline  TUG 22.08 sec 02/06/2019    Time  8    Period  Weeks    Status  New    Target Date  04/03/19            Plan - 03/06/19 1601    Clinical Impression Statement  Pt demonstrates improved dynamic balance today during obstacle course exercise with tapping cones without loss of balance and maintaining consistent gait speed. However, pt still demonstrated 2 instances of slight LOB when trying to clear R LE over obstacle but regained balanace with stepping strategy. Pt was able to improve R LE clearance over obstacle with cuing. Pt with variable BOS when ambulating with horizontal head turns. Pt with decreased postural stability with standing on foam with vertical head turns requiring intermittent UE support. Pt will continue to benefit from further skilled therapy to improve balance and B LE strength.    Personal Factors and Comorbidities  Age    Examination-Activity Limitations  Bathing;Bed Mobility;Bend;Hygiene/Grooming;Locomotion Level;Reach Overhead;Squat;Stairs;Stand;Toileting    Examination-Participation Restrictions  Cleaning;Community Activity;Laundry;Yard Work    Conservation officer, historic buildingstability/Clinical Decision Making  Evolving/Moderate complexity    Clinical Decision Making  Moderate    Rehab Potential  Good    PT Frequency  2x / week    PT Duration  8 weeks    PT Treatment/Interventions  ADLs/Self Care Home Management;Traction;Cryotherapy;Electrical Stimulation;DME Instruction;Gait Network engineertraining;Stair training;Therapeutic activities;Therapeutic exercise;Balance training;Neuromuscular re-education;Patient/family  education;Manual techniques;Passive range of motion;Energy conservation;Taping;Functional mobility training    PT Next Visit Plan  Progress LE strengthening, balance: ankle/hip/stepping strategies, progress dynamic balance exercises    PT Home Exercise Plan  See HEP    Consulted and Agree with Plan of Care  Patient  Patient will benefit from skilled therapeutic intervention in order to improve the following deficits and impairments:  Abnormal gait, Decreased balance, Decreased endurance, Decreased mobility, Difficulty walking, Hypomobility, Increased muscle spasms, Impaired sensation, Decreased range of motion, Improper body mechanics, Decreased activity tolerance, Decreased coordination, Decreased strength, Impaired flexibility, Postural dysfunction, Pain  Visit Diagnosis: 1. Balance problem   2. Gait abnormality   3. Muscle weakness (generalized)        Problem List Patient Active Problem List   Diagnosis Date Noted  . Hyponatremia 01/26/2019  . ETOH abuse 01/26/2019  . Tobacco abuse 01/26/2019   Pura Spice, PT, DPT # [id number], SPT 03/06/2019, 4:15 PM  [place] [facility] [address] [place], [place], [address] Phone: [phone]   Fax:  [phone]  Name: ZAKYIA GAGAN MRN: [id number] Date of Birth: February 14, 1951

## 2019-03-06 NOTE — Patient Instructions (Signed)
Access Code: [email or web address]  URL: [email or web address]  Date: 03/06/2019  Prepared by: Dorcas Carrow   Exercises  Straight Leg Raise - 10 reps - 2 sets - 1x daily - 4x weekly  Supine Bridge with Gluteal Set and Spinal Articulation - 10 reps - 2 sets - 1x daily - 4x weekly  Supine Ankle Pumps - 20 reps - 1 sets - 1x daily - 4x weekly

## 2019-03-08 ENCOUNTER — Encounter: Payer: Medicare Other | Admitting: Physical Therapy

## 2019-03-14 ENCOUNTER — Ambulatory Visit: Payer: Medicare Other | Admitting: Physical Therapy

## 2019-03-16 ENCOUNTER — Ambulatory Visit: Payer: Medicare Other | Admitting: Physical Therapy

## 2019-03-16 ENCOUNTER — Encounter: Payer: Self-pay | Admitting: Physical Therapy

## 2019-03-16 ENCOUNTER — Other Ambulatory Visit: Payer: Self-pay

## 2019-03-16 DIAGNOSIS — M6281 Muscle weakness (generalized): Secondary | ICD-10-CM

## 2019-03-16 DIAGNOSIS — R2689 Other abnormalities of gait and mobility: Secondary | ICD-10-CM | POA: Diagnosis not present

## 2019-03-16 DIAGNOSIS — R269 Unspecified abnormalities of gait and mobility: Secondary | ICD-10-CM

## 2019-03-16 NOTE — Therapy (Signed)
[facility] [address] [place], [place], [address] Phone: [phone]   Fax:  [phone]  Physical Therapy Treatment  Patient Details  Name: Amy Becker MRN: [id number] Date of Birth: 1950/09/18 Referring Provider (PT): Orson Aloe, MD   Encounter Date: 03/16/2019  PT End of Session - 03/16/19 1222    Visit Number  8    Number of Visits  16    Date for PT Re-Evaluation  04/03/19    Authorization - Visit Number  8    Authorization - Number of Visits  10    PT Start Time  1027    PT Stop Time  1111    PT Time Calculation (min)  44 min    Equipment Utilized During Treatment  Gait belt    Activity Tolerance  Patient tolerated treatment well;Patient limited by pain    Behavior During Therapy  [facility] for tasks assessed/performed       Past Medical History:  Diagnosis Date  . Alcohol abuse   . Hypertension   . Hyponatremia 01/26/2019    Past Surgical History:  Procedure Laterality Date  . ABDOMINAL HYSTERECTOMY      There were no vitals filed for this visit.  Subjective Assessment - 03/16/19 1125    Subjective  No pain today. Pt states ankles are not as swollen because she "has been doing ankle pump," and because of medication change. Pt reports she only uses AD for gait now with inclines at home and that husband helps her walk on inclines.    Patient is accompained by:  Family member    Pertinent History  Pt reports she had a relaxing Sunday. Pt reports she did a lot of cleaning with son and husband. Pt reports LBP and that B neck/shoulders hurt today and states it is same pain she has had since previous fall. Pt reports that low back hurts when getting up from toilet. Pt does not provide pain rating. Pt reports she has appointment with primary care doctor this Wednesday and is going to discuss swelling in her ankles.    Limitations  Walking;House hold activities;Standing    Patient Stated Goals  Complete household  chores/ garden    Currently in Pain?  No/denies       BP 113/64 HR 74  Therapeutic Exercise:  Seated marches 3# ankle weight 2x10, 4# ankle weight 1x8 Seated heel raises 4# ankle weight, 2x20 Seated toe raies 4# ankle weight 20x Supine ankle pumps 2x20 Staggered glute bridge 2x10 B LEs. Cuing provided for technique Obstacle course with 4" step and cone taps, stepping onto foam - 8x. Cuing provided for technique. Pt with 3 instances of LOB and used SPT for UE assist to regain balance. Seated, fit disc, feet on foam, cone staking with trunk rotation/reaching - 2x each side Seated on fit disc, feet foam, RTB core twists - 1x20 B Seated, fit disc, feet on foam RTB rows - 2x20  Seated, fit disc, feet on foam, RTB pull aparts 1x20. Cuing for posture/technqiue.     PT Long Term Goals - 02/06/19 1408      PT LONG TERM GOAL #1   Title  Pt FOTO score will increase from 28 to 48 to improve functional mobility    Baseline  Pt score 28 02/06/2019    Time  8    Period  Weeks    Status  New    Target Date  04/03/19  PT LONG TERM GOAL #2   Title  Pt Berg score will be at least 48/56 to decrease fall risk    Baseline  Pt Berg score is 37 02/06/2019    Time  8    Period  Weeks    Status  New    Target Date  04/03/19      PT LONG TERM GOAL #3   Title  Pt will be able to perform 5xSTS in 12 seconds or less with UE support    Baseline  16.35 seconds with UE support 02/06/2019    Time  4    Period  Weeks    Status  New    Target Date  03/06/19      PT LONG TERM GOAL #4   Title  Pt will increase LE MMT scores by at least 1/2 a grade to improve LE strength and safety with gait    Baseline  Hip flexion R/L: 4/5 B; Knee extension R/L: 4/5 painful, no attempt on L; Knee flexion R/L: 3+/5 B  DF R/L: 4+/5;  PF R/L: 4+/5; Hip abduction R/L 4+/5; hip adduction: R/L 4/5; Hip extension R/L: 4/5 B 02/06/2019    Time  4    Period  Weeks    Status  New    Target Date  03/06/19      PT LONG TERM  GOAL #5   Title  Pt will decrease TUG time to 15 seconds or less for improved mobility and safety with ADLs such as toileting and getting in and out of the shower.    Baseline  TUG 22.08 sec 02/06/2019    Time  8    Period  Weeks    Status  New    Target Date  04/03/19            Plan - 03/16/19 1238    Clinical Impression Statement  Pt had difficulty with lateral stepping to R on obstacle course with decreased L LE stability. Pt also with 3 instances of LOB when lateral stepping onto foam, regaining balance with UE assist from SPT. Pt also required cuing for clearing 4" step/increase B hip flexion in obstacle course and attempted to compensate with lateral approach, indicating decreased hip flexor strength B. Pt will continue to benefit from further skilled therapy to increase B LE strength and balance.    Personal Factors and Comorbidities  Age    Examination-Activity Limitations  Bathing;Bed Mobility;Bend;Hygiene/Grooming;Locomotion Level;Reach Overhead;Squat;Stairs;Stand;Toileting    Examination-Participation Restrictions  Cleaning;Community Activity;Laundry;Yard Work    Merchant navy officer  Evolving/Moderate complexity    Clinical Decision Making  Moderate    Rehab Potential  Good    PT Frequency  2x / week    PT Duration  8 weeks    PT Treatment/Interventions  ADLs/Self Care Home Management;Traction;Cryotherapy;Electrical Stimulation;DME Instruction;Gait Scientist, forensic;Therapeutic activities;Therapeutic exercise;Balance training;Neuromuscular re-education;Patient/family education;Manual techniques;Passive range of motion;Energy conservation;Taping;Functional mobility training    PT Next Visit Plan  Progress LE strengthening, balance: ankle/hip/stepping strategies, progress dynamic balance exercises; hip flexor strengthening.  CHECK GOALS    PT Home Exercise Plan  See HEP    Consulted and Agree with Plan of Care  Patient       Patient will benefit from  skilled therapeutic intervention in order to improve the following deficits and impairments:  Abnormal gait, Decreased balance, Decreased endurance, Decreased mobility, Difficulty walking, Hypomobility, Increased muscle spasms, Impaired sensation, Decreased range of motion, Improper body mechanics, Decreased activity tolerance, Decreased coordination, Decreased strength, Impaired  flexibility, Postural dysfunction, Pain  Visit Diagnosis: Balance problem  Gait abnormality  Muscle weakness (generalized)     Problem List Patient Active Problem List   Diagnosis Date Noted  . Hyponatremia 01/26/2019  . ETOH abuse 01/26/2019  . Tobacco abuse 01/26/2019   Cammie McgeeMichael C Sherk, PT, DPT # [id number] Temple PaciniHaley Nigel Ericsson, SPT 03/16/2019, 2:07 PM  [place] [facility] [address] [place], [place], [address] Phone: [phone]   Fax:  [phone]  Name: Amy Becker MRN: [id number] Date of Birth: 03/01/1951

## 2019-03-21 ENCOUNTER — Encounter: Payer: Medicare Other | Admitting: Physical Therapy

## 2019-03-23 ENCOUNTER — Ambulatory Visit: Payer: Medicare Other | Attending: Internal Medicine | Admitting: Physical Therapy

## 2019-03-23 ENCOUNTER — Encounter: Payer: Self-pay | Admitting: Physical Therapy

## 2019-03-23 ENCOUNTER — Other Ambulatory Visit: Payer: Self-pay

## 2019-03-23 DIAGNOSIS — R269 Unspecified abnormalities of gait and mobility: Secondary | ICD-10-CM | POA: Insufficient documentation

## 2019-03-23 DIAGNOSIS — R2689 Other abnormalities of gait and mobility: Secondary | ICD-10-CM | POA: Diagnosis present

## 2019-03-23 DIAGNOSIS — M6281 Muscle weakness (generalized): Secondary | ICD-10-CM | POA: Diagnosis present

## 2019-03-23 NOTE — Patient Instructions (Signed)
Access Code: [email or web address]  URL: [email or web address]  Date: 03/23/2019  Prepared by: Dorcas Carrow   Exercises  Standing Tandem Balance with Counter Support - 2 reps - 1 sets - 30 hold - 1x daily - 7x weekly  Single Leg Stance with Support - 2 reps - 1 sets - 30 hold - 1x daily - 7x weekly  Standing March with Unilateral Counter Support - 20 reps - 2 sets - 1x daily - 4x weekly  Heel rises with counter support - 20 reps - 2 sets - 1x daily - 4x weekly  Sit to Stand - 20 reps - 2 sets - 1x daily - 4x weekly  Supine Bridge with Gluteal Set and Spinal Articulation - 20 reps - 2 sets - 1x daily - 4x weekly

## 2019-03-23 NOTE — Therapy (Signed)
[facility] [address]. [place], [place], [address] Phone: [phone]   Fax:  [phone]  Physical Therapy Treatment  Patient Details  Name: Amy Becker MRN: [id number] Date of Birth: 17-Jan-1951 Referring Provider (PT): Cleotis Lema, MD   Encounter Date: 03/23/2019  PT End of Session - 03/23/19 1244    Visit Number  9    Number of Visits  16    Date for PT Re-Evaluation  04/03/19    Authorization - Visit Number  9    Authorization - Number of Visits  10    PT Start Time  0939    PT Stop Time  1022    PT Time Calculation (min)  43 min    Equipment Utilized During Treatment  Gait belt    Activity Tolerance  Patient tolerated treatment well    Behavior During Therapy  [facility] for tasks assessed/performed       Past Medical History:  Diagnosis Date  . Alcohol abuse   . Hypertension   . Hyponatremia 01/26/2019    Past Surgical History:  Procedure Laterality Date  . ABDOMINAL HYSTERECTOMY      There were no vitals filed for this visit.  Subjective Assessment - 03/23/19 1243    Subjective  Pt reports she "almost fell" while outside in garden on uneven surface but that she was able to independently regain balance with taking a step.  Pt. reports no pain. Pt reports she feels she has "improved a lot" with strength and balance.    Patient is accompained by:  Family member    Pertinent History  Pt reports she had a relaxing Sunday. Pt reports she did a lot of cleaning with son and husband. Pt reports LBP and that B neck/shoulders hurt today and states it is same pain she has had since previous fall. Pt reports that low back hurts when getting up from toilet. Pt does not provide pain rating. Pt reports she has appointment with primary care doctor this Wednesday and is going to discuss swelling in her ankles.    Limitations  Walking;House hold activities;Standing    Patient Stated Goals  Complete household chores/ garden    Currently in Pain?  No/denies        Seated  BP: 139/74 HR 74  FOTO: score 62 (age norm 16)  MMT B LEs: grossly 5/5 except B hip flexors 4/5   Neuro: BERG 53/56 See HEP  Therapeutic exercise: 5xSTS - 10.2 seconds TUG - 7.6 seconds Nustep, L3, 10 min See HEP       PT Education - 03/23/19 1244    Education Details  Pt educated on new HEP (see HEP)    Person(s) Educated  Patient    Methods  Explanation;Demonstration    Comprehension  Returned demonstration;Verbalized understanding          PT Long Term Goals - 03/23/19 1729      PT LONG TERM GOAL #1   Title  Pt FOTO score will increase from 28 to 48 to improve functional mobility    Baseline  Pt score 28 02/06/2019; FOTO score 63 9/32020    Time  8    Period  Weeks    Status  Achieved    Target Date  03/23/19      PT LONG TERM GOAL #2   Title  Pt Berg score will be at least 48/56 to decrease fall risk    Baseline  Pt Merrilee Jansky  score is 37 02/06/2019; Pt Berg score 53/56 03/23/2019    Time  8    Period  Weeks    Status  Achieved    Target Date  03/23/19      PT LONG TERM GOAL #3   Title  Pt will be able to perform 5xSTS in 12 seconds or less with UE support    Baseline  16.35 seconds with UE support 02/06/2019; 5xSTS 10.2 seconds 03/23/2019    Time  4    Period  Weeks    Status  Achieved    Target Date  03/23/19      PT LONG TERM GOAL #4   Title  Pt will increase LE MMT scores by at least 1/2 a grade to improve LE strength and safety with gait    Baseline  Hip flexion R/L: 4/5 B; Knee extension R/L: 4/5 painful, no attempt on L; Knee flexion R/L: 3+/5 B  DF R/L: 4+/5;  PF R/L: 4+/5; Hip abduction R/L 4+/5; hip adduction: R/L 4/5; Hip extension R/L: 4/5 B 02/06/2019 MMT reveals pt B LEs 5/5 except for B hip flexors which are 4/5 03/23/2019    Time  4    Period  Weeks    Status  Partially Met    Target Date  04/03/19      PT LONG TERM GOAL #5   Title  Pt will decrease TUG time to 15 seconds or less for improved  mobility and safety with ADLs such as toileting and getting in and out of the shower.    Baseline  TUG 22.08 sec 02/06/2019; TUG 7.6 seconds 03/23/2019    Time  8    Period  Weeks    Status  Achieved    Target Date  03/23/19            Plan - 03/23/19 1735    Clinical Impression Statement  Pt demonstrates progress in therapy today with FOTO score of 66 (age norm 74) indicating improved functional mobility. MMT reveals pt B LEs 5/5 except for B hip flexors which are 4/5. Pt Berg score is 53/56 which indicates improved balance and that pt is at a low fall risk. Pt 5xSTS was 10.2 seconds indicating increased LE power, and pt completed TUG in 7.6 seconds indicating reduced fall risk. However, pt still has difficulty with SLB and tandem stance. Pt educated on new HEP for strength and balance, but still requires cuing for previous home exercises. Plan to d/c pt next session. Pt will benefit from one follow-up appointment to review full HEP so pt can continue benefits of therapy after discharge and to improve static balance.    Personal Factors and Comorbidities  Age    Examination-Activity Limitations  Bathing;Bed Mobility;Bend;Hygiene/Grooming;Locomotion Level;Reach Overhead;Squat;Stairs;Stand;Toileting    Examination-Participation Restrictions  Cleaning;Community Activity;Laundry;Yard Work    [employer]  Evolving/Moderate complexity    Clinical Decision Making  Moderate    Rehab Potential  Good    PT Frequency  2x / week    PT Duration  8 weeks    PT Treatment/Interventions  ADLs/Self Care Home Management;Traction;Cryotherapy;Electrical Stimulation;DME Instruction;Gait Scientist, forensic;Therapeutic activities;Therapeutic exercise;Balance training;Neuromuscular re-education;Patient/family education;Manual techniques;Passive range of motion;Energy conservation;Taping;Functional mobility training    PT Next Visit Plan  Review HEP and d/c    PT Home Exercise Plan  See HEP     Consulted and Agree with Plan of Care  Patient       Patient will benefit from skilled therapeutic intervention in order  to improve the following deficits and impairments:  Abnormal gait, Decreased balance, Decreased endurance, Decreased mobility, Difficulty walking, Hypomobility, Increased muscle spasms, Impaired sensation, Decreased range of motion, Improper body mechanics, Decreased activity tolerance, Decreased coordination, Decreased strength, Impaired flexibility, Postural dysfunction, Pain  Visit Diagnosis: Balance problem  Gait abnormality  Muscle weakness (generalized)     Problem List Patient Active Problem List   Diagnosis Date Noted  . Hyponatremia 01/26/2019  . ETOH abuse 01/26/2019  . Tobacco abuse 01/26/2019   Pura Spice, PT, DPT # [id number] SPT 03/24/2019, 8:06 AM  [facility] [address] [place], [place], [address] Phone: [phone]   Fax:  [phone]  Name: Amy Becker MRN: [id number] Date of Birth: October 07, 1950

## 2019-03-28 ENCOUNTER — Ambulatory Visit: Payer: Medicare Other | Admitting: Physical Therapy

## 2019-03-30 ENCOUNTER — Ambulatory Visit: Payer: Medicare Other | Admitting: Physical Therapy

## 2019-04-04 ENCOUNTER — Encounter: Payer: Medicare Other | Admitting: Physical Therapy

## 2019-04-06 ENCOUNTER — Encounter: Payer: Medicare Other | Admitting: Physical Therapy

## 2019-04-11 ENCOUNTER — Encounter: Payer: Medicare Other | Admitting: Physical Therapy

## 2019-04-13 ENCOUNTER — Encounter: Payer: Medicare Other | Admitting: Physical Therapy

## 2021-02-04 ENCOUNTER — Emergency Department: Payer: Medicare Other

## 2021-02-04 ENCOUNTER — Encounter: Payer: Self-pay | Admitting: Radiology

## 2021-02-04 ENCOUNTER — Other Ambulatory Visit: Payer: Self-pay

## 2021-02-04 ENCOUNTER — Inpatient Hospital Stay
Admission: EM | Admit: 2021-02-04 | Discharge: 2021-02-17 | DRG: 871 | Disposition: E | Payer: Medicare Other | Attending: Pulmonary Disease | Admitting: Pulmonary Disease

## 2021-02-04 DIAGNOSIS — F32A Depression, unspecified: Secondary | ICD-10-CM | POA: Diagnosis present

## 2021-02-04 DIAGNOSIS — R0602 Shortness of breath: Secondary | ICD-10-CM

## 2021-02-04 DIAGNOSIS — Z720 Tobacco use: Secondary | ICD-10-CM | POA: Diagnosis present

## 2021-02-04 DIAGNOSIS — J9601 Acute respiratory failure with hypoxia: Secondary | ICD-10-CM

## 2021-02-04 DIAGNOSIS — I248 Other forms of acute ischemic heart disease: Secondary | ICD-10-CM | POA: Diagnosis present

## 2021-02-04 DIAGNOSIS — E872 Acidosis: Secondary | ICD-10-CM | POA: Diagnosis not present

## 2021-02-04 DIAGNOSIS — F418 Other specified anxiety disorders: Secondary | ICD-10-CM | POA: Diagnosis not present

## 2021-02-04 DIAGNOSIS — F10931 Alcohol use, unspecified with withdrawal delirium: Secondary | ICD-10-CM

## 2021-02-04 DIAGNOSIS — E870 Hyperosmolality and hypernatremia: Secondary | ICD-10-CM | POA: Diagnosis not present

## 2021-02-04 DIAGNOSIS — D696 Thrombocytopenia, unspecified: Secondary | ICD-10-CM

## 2021-02-04 DIAGNOSIS — F10231 Alcohol dependence with withdrawal delirium: Secondary | ICD-10-CM | POA: Diagnosis not present

## 2021-02-04 DIAGNOSIS — Z66 Do not resuscitate: Secondary | ICD-10-CM | POA: Diagnosis not present

## 2021-02-04 DIAGNOSIS — J439 Emphysema, unspecified: Secondary | ICD-10-CM | POA: Diagnosis present

## 2021-02-04 DIAGNOSIS — Z452 Encounter for adjustment and management of vascular access device: Secondary | ICD-10-CM

## 2021-02-04 DIAGNOSIS — Z01818 Encounter for other preprocedural examination: Secondary | ICD-10-CM

## 2021-02-04 DIAGNOSIS — R29726 NIHSS score 26: Secondary | ICD-10-CM | POA: Diagnosis not present

## 2021-02-04 DIAGNOSIS — H43393 Other vitreous opacities, bilateral: Secondary | ICD-10-CM | POA: Diagnosis present

## 2021-02-04 DIAGNOSIS — M19011 Primary osteoarthritis, right shoulder: Secondary | ICD-10-CM | POA: Diagnosis present

## 2021-02-04 DIAGNOSIS — R0902 Hypoxemia: Secondary | ICD-10-CM

## 2021-02-04 DIAGNOSIS — I10 Essential (primary) hypertension: Secondary | ICD-10-CM | POA: Diagnosis not present

## 2021-02-04 DIAGNOSIS — A491 Streptococcal infection, unspecified site: Secondary | ICD-10-CM

## 2021-02-04 DIAGNOSIS — H43399 Other vitreous opacities, unspecified eye: Secondary | ICD-10-CM | POA: Insufficient documentation

## 2021-02-04 DIAGNOSIS — F419 Anxiety disorder, unspecified: Secondary | ICD-10-CM | POA: Diagnosis present

## 2021-02-04 DIAGNOSIS — I63332 Cerebral infarction due to thrombosis of left posterior cerebral artery: Secondary | ICD-10-CM | POA: Diagnosis not present

## 2021-02-04 DIAGNOSIS — H3500 Unspecified background retinopathy: Secondary | ICD-10-CM | POA: Diagnosis present

## 2021-02-04 DIAGNOSIS — E785 Hyperlipidemia, unspecified: Secondary | ICD-10-CM | POA: Diagnosis present

## 2021-02-04 DIAGNOSIS — D65 Disseminated intravascular coagulation [defibrination syndrome]: Secondary | ICD-10-CM | POA: Diagnosis present

## 2021-02-04 DIAGNOSIS — I639 Cerebral infarction, unspecified: Secondary | ICD-10-CM

## 2021-02-04 DIAGNOSIS — Z4659 Encounter for fitting and adjustment of other gastrointestinal appliance and device: Secondary | ICD-10-CM

## 2021-02-04 DIAGNOSIS — G936 Cerebral edema: Secondary | ICD-10-CM | POA: Diagnosis not present

## 2021-02-04 DIAGNOSIS — Z8249 Family history of ischemic heart disease and other diseases of the circulatory system: Secondary | ICD-10-CM

## 2021-02-04 DIAGNOSIS — M19041 Primary osteoarthritis, right hand: Secondary | ICD-10-CM | POA: Diagnosis present

## 2021-02-04 DIAGNOSIS — I214 Non-ST elevation (NSTEMI) myocardial infarction: Secondary | ICD-10-CM | POA: Diagnosis not present

## 2021-02-04 DIAGNOSIS — J189 Pneumonia, unspecified organism: Secondary | ICD-10-CM | POA: Diagnosis present

## 2021-02-04 DIAGNOSIS — H4313 Vitreous hemorrhage, bilateral: Secondary | ICD-10-CM | POA: Diagnosis present

## 2021-02-04 DIAGNOSIS — G9341 Metabolic encephalopathy: Secondary | ICD-10-CM | POA: Diagnosis present

## 2021-02-04 DIAGNOSIS — I998 Other disorder of circulatory system: Secondary | ICD-10-CM

## 2021-02-04 DIAGNOSIS — R778 Other specified abnormalities of plasma proteins: Secondary | ICD-10-CM

## 2021-02-04 DIAGNOSIS — M2550 Pain in unspecified joint: Secondary | ICD-10-CM | POA: Insufficient documentation

## 2021-02-04 DIAGNOSIS — E871 Hypo-osmolality and hyponatremia: Secondary | ICD-10-CM | POA: Diagnosis not present

## 2021-02-04 DIAGNOSIS — I2119 ST elevation (STEMI) myocardial infarction involving other coronary artery of inferior wall: Secondary | ICD-10-CM | POA: Diagnosis not present

## 2021-02-04 DIAGNOSIS — H538 Other visual disturbances: Secondary | ICD-10-CM | POA: Diagnosis not present

## 2021-02-04 DIAGNOSIS — R7881 Bacteremia: Secondary | ICD-10-CM | POA: Diagnosis not present

## 2021-02-04 DIAGNOSIS — R6520 Severe sepsis without septic shock: Secondary | ICD-10-CM | POA: Diagnosis present

## 2021-02-04 DIAGNOSIS — N179 Acute kidney failure, unspecified: Secondary | ICD-10-CM | POA: Diagnosis not present

## 2021-02-04 DIAGNOSIS — F1010 Alcohol abuse, uncomplicated: Secondary | ICD-10-CM

## 2021-02-04 DIAGNOSIS — B951 Streptococcus, group B, as the cause of diseases classified elsewhere: Secondary | ICD-10-CM | POA: Diagnosis not present

## 2021-02-04 DIAGNOSIS — N390 Urinary tract infection, site not specified: Secondary | ICD-10-CM | POA: Diagnosis present

## 2021-02-04 DIAGNOSIS — A401 Sepsis due to streptococcus, group B: Secondary | ICD-10-CM | POA: Diagnosis present

## 2021-02-04 DIAGNOSIS — R197 Diarrhea, unspecified: Secondary | ICD-10-CM

## 2021-02-04 DIAGNOSIS — R579 Shock, unspecified: Secondary | ICD-10-CM | POA: Diagnosis not present

## 2021-02-04 DIAGNOSIS — Z20822 Contact with and (suspected) exposure to covid-19: Secondary | ICD-10-CM | POA: Diagnosis present

## 2021-02-04 DIAGNOSIS — J690 Pneumonitis due to inhalation of food and vomit: Secondary | ICD-10-CM | POA: Diagnosis not present

## 2021-02-04 DIAGNOSIS — Z515 Encounter for palliative care: Secondary | ICD-10-CM | POA: Diagnosis not present

## 2021-02-04 DIAGNOSIS — E860 Dehydration: Secondary | ICD-10-CM

## 2021-02-04 DIAGNOSIS — F17210 Nicotine dependence, cigarettes, uncomplicated: Secondary | ICD-10-CM | POA: Diagnosis present

## 2021-02-04 DIAGNOSIS — Z79899 Other long term (current) drug therapy: Secondary | ICD-10-CM

## 2021-02-04 DIAGNOSIS — A419 Sepsis, unspecified organism: Secondary | ICD-10-CM | POA: Diagnosis not present

## 2021-02-04 DIAGNOSIS — Z90710 Acquired absence of both cervix and uterus: Secondary | ICD-10-CM

## 2021-02-04 DIAGNOSIS — I63232 Cerebral infarction due to unspecified occlusion or stenosis of left carotid arteries: Secondary | ICD-10-CM | POA: Diagnosis not present

## 2021-02-04 DIAGNOSIS — E876 Hypokalemia: Secondary | ICD-10-CM | POA: Diagnosis not present

## 2021-02-04 DIAGNOSIS — M25511 Pain in right shoulder: Secondary | ICD-10-CM | POA: Diagnosis not present

## 2021-02-04 DIAGNOSIS — I6522 Occlusion and stenosis of left carotid artery: Secondary | ICD-10-CM | POA: Diagnosis not present

## 2021-02-04 LAB — URINALYSIS, COMPLETE (UACMP) WITH MICROSCOPIC
Bilirubin Urine: NEGATIVE
Glucose, UA: NEGATIVE mg/dL
Ketones, ur: 20 mg/dL — AB
Leukocytes,Ua: NEGATIVE
Nitrite: NEGATIVE
Protein, ur: 30 mg/dL — AB
RBC / HPF: >50 RBC/hpf — ABNORMAL HIGH (ref 0–5)
Specific Gravity, Urine: 1.032 — ABNORMAL HIGH (ref 1.005–1.030)
pH: 6 (ref 5.0–8.0)

## 2021-02-04 LAB — BASIC METABOLIC PANEL
Anion gap: 15 (ref 5–15)
BUN: 20 mg/dL (ref 8–23)
CO2: 18 mmol/L — ABNORMAL LOW (ref 22–32)
Calcium: 8.8 mg/dL — ABNORMAL LOW (ref 8.9–10.3)
Chloride: 97 mmol/L — ABNORMAL LOW (ref 98–111)
Creatinine, Ser: 0.66 mg/dL (ref 0.44–1.00)
GFR, Estimated: >60 mL/min (ref >60)
Glucose, Bld: 80 mg/dL (ref 70–99)
Potassium: 2.9 mmol/L — ABNORMAL LOW (ref 3.5–5.1)
Sodium: 130 mmol/L — ABNORMAL LOW (ref 135–145)

## 2021-02-04 LAB — HEPATIC FUNCTION PANEL
ALT: 11 U/L (ref 0–44)
AST: 34 U/L (ref 15–41)
Albumin: 2.6 g/dL — ABNORMAL LOW (ref 3.5–5.0)
Alkaline Phosphatase: 101 U/L (ref 38–126)
Bilirubin, Direct: 0.4 mg/dL — ABNORMAL HIGH (ref 0.0–0.2)
Indirect Bilirubin: 1.5 mg/dL — ABNORMAL HIGH (ref 0.3–0.9)
Total Bilirubin: 1.9 mg/dL — ABNORMAL HIGH (ref 0.3–1.2)
Total Protein: 6 g/dL — ABNORMAL LOW (ref 6.5–8.1)

## 2021-02-04 LAB — TROPONIN I (HIGH SENSITIVITY)
Troponin I (High Sensitivity): 67 ng/L — ABNORMAL HIGH (ref <18)
Troponin I (High Sensitivity): 69 ng/L — ABNORMAL HIGH (ref <18)

## 2021-02-04 LAB — CBC
HCT: 44.6 % (ref 36.0–46.0)
Hemoglobin: 15.9 g/dL — ABNORMAL HIGH (ref 12.0–15.0)
MCH: 31.9 pg (ref 26.0–34.0)
MCHC: 35.7 g/dL (ref 30.0–36.0)
MCV: 89.6 fL (ref 80.0–100.0)
Platelets: 53 10*3/uL — ABNORMAL LOW (ref 150–400)
RBC: 4.98 MIL/uL (ref 3.87–5.11)
RDW: 13.2 % (ref 11.5–15.5)
WBC: 15 10*3/uL — ABNORMAL HIGH (ref 4.0–10.5)
nRBC: 0 % (ref 0.0–0.2)

## 2021-02-04 LAB — SAVE SMEAR(SSMR), FOR PROVIDER SLIDE REVIEW

## 2021-02-04 LAB — LACTIC ACID, PLASMA
Lactic Acid, Venous: 2.2 mmol/L (ref 0.5–1.9)
Lactic Acid, Venous: 1.6 mmol/L (ref 0.5–1.9)

## 2021-02-04 LAB — RESP PANEL BY RT-PCR (FLU A&B, COVID) ARPGX2
Influenza A by PCR: NEGATIVE
Influenza B by PCR: NEGATIVE
SARS Coronavirus 2 by RT PCR: NEGATIVE

## 2021-02-04 LAB — PROCALCITONIN: Procalcitonin: 1.74 ng/mL

## 2021-02-04 LAB — PHOSPHORUS: Phosphorus: 3.7 mg/dL (ref 2.5–4.6)

## 2021-02-04 LAB — LACTATE DEHYDROGENASE: LDH: 217 U/L — ABNORMAL HIGH (ref 98–192)

## 2021-02-04 LAB — MAGNESIUM: Magnesium: 1.7 mg/dL (ref 1.7–2.4)

## 2021-02-04 LAB — I-STAT CREATININE, ED: Creatinine, Ser: 0.5 mg/dL (ref 0.44–1.00)

## 2021-02-04 MED ORDER — HEPARIN (PORCINE) 25000 UT/250ML-% IV SOLN: 700.0000 [IU]/h | INTRAVENOUS | Status: DC

## 2021-02-04 MED ORDER — SODIUM CHLORIDE 0.9 % IV SOLN
1.0000 g | Freq: Once | INTRAVENOUS | Status: AC
  Administered 2021-02-04: 1 g via INTRAVENOUS
  Filled 2021-02-04: qty 10

## 2021-02-04 MED ORDER — MAGNESIUM SULFATE 2 GM/50ML IV SOLN
2.0000 g | Freq: Once | INTRAVENOUS | Status: AC
  Administered 2021-02-04: 2 g via INTRAVENOUS
  Filled 2021-02-04: qty 50

## 2021-02-04 MED ORDER — HYDROXYZINE HCL 25 MG PO TABS
25.0000 mg | ORAL_TABLET | Freq: Three times a day (TID) | ORAL | Status: DC
  Administered 2021-02-04 – 2021-02-08 (×12): 25 mg via ORAL
  Filled 2021-02-04 (×14): qty 1

## 2021-02-04 MED ORDER — ONDANSETRON HCL 4 MG/2ML IJ SOLN: 4.0000 mg | Freq: Three times a day (TID) | INTRAMUSCULAR | Status: DC | PRN

## 2021-02-04 MED ORDER — MIRTAZAPINE 15 MG PO TABS
45.0000 mg | ORAL_TABLET | Freq: Every day | ORAL | Status: DC
  Administered 2021-02-04 – 2021-02-08 (×5): 45 mg via ORAL
  Filled 2021-02-04 (×5): qty 3

## 2021-02-04 MED ORDER — ATORVASTATIN CALCIUM 20 MG PO TABS: 40.0000 mg | ORAL_TABLET | Freq: Every day | ORAL | Status: DC

## 2021-02-04 MED ORDER — HYDRALAZINE HCL 20 MG/ML IJ SOLN
5.0000 mg | INTRAMUSCULAR | Status: DC | PRN
  Administered 2021-02-05 – 2021-02-08 (×6): 5 mg via INTRAVENOUS
  Filled 2021-02-04 (×6): qty 1

## 2021-02-04 MED ORDER — TETRACAINE HCL 0.5 % OP SOLN
2.0000 [drp] | Freq: Once | OPHTHALMIC | Status: AC
  Administered 2021-02-04: 2 [drp] via OPHTHALMIC
  Filled 2021-02-04: qty 4

## 2021-02-04 MED ORDER — LISINOPRIL 20 MG PO TABS
40.0000 mg | ORAL_TABLET | Freq: Every day | ORAL | Status: DC
  Administered 2021-02-04 – 2021-02-08 (×5): 40 mg via ORAL
  Filled 2021-02-04 (×5): qty 2

## 2021-02-04 MED ORDER — NICOTINE 21 MG/24HR TD PT24
21.0000 mg | MEDICATED_PATCH | Freq: Every day | TRANSDERMAL | Status: DC
  Administered 2021-02-05 – 2021-02-09 (×5): 21 mg via TRANSDERMAL
  Filled 2021-02-04 (×5): qty 1

## 2021-02-04 MED ORDER — DM-GUAIFENESIN ER 30-600 MG PO TB12
1.0000 | ORAL_TABLET | Freq: Two times a day (BID) | ORAL | Status: DC | PRN
  Administered 2021-02-05: 16:00:00 1 via ORAL
  Filled 2021-02-04: qty 1

## 2021-02-04 MED ORDER — SODIUM CHLORIDE 0.9 % IV SOLN: INTRAVENOUS | Status: DC

## 2021-02-04 MED ORDER — PANTOPRAZOLE SODIUM 20 MG PO TBEC
20.0000 mg | DELAYED_RELEASE_TABLET | Freq: Every day | ORAL | Status: DC
  Administered 2021-02-05 – 2021-02-08 (×4): 20 mg via ORAL
  Filled 2021-02-04 (×7): qty 1

## 2021-02-04 MED ORDER — SODIUM CHLORIDE 0.9 % IV SOLN
500.0000 mg | INTRAVENOUS | Status: DC
  Administered 2021-02-05 – 2021-02-06 (×2): 500 mg via INTRAVENOUS
  Filled 2021-02-04 (×2): qty 500

## 2021-02-04 MED ORDER — AMLODIPINE BESYLATE 5 MG PO TABS: 5.0000 mg | ORAL_TABLET | Freq: Every day | ORAL | Status: DC

## 2021-02-04 MED ORDER — SERTRALINE HCL 50 MG PO TABS
150.0000 mg | ORAL_TABLET | Freq: Every day | ORAL | Status: DC
  Administered 2021-02-04 – 2021-02-08 (×5): 150 mg via ORAL
  Filled 2021-02-04 (×5): qty 3

## 2021-02-04 MED ORDER — FENTANYL CITRATE (PF) 100 MCG/2ML IJ SOLN
50.0000 ug | Freq: Once | INTRAMUSCULAR | Status: AC
Start: 2021-02-04 — End: 2021-02-04
  Administered 2021-02-04: 50 ug via INTRAVENOUS
  Filled 2021-02-04: qty 2

## 2021-02-04 MED ORDER — POTASSIUM CHLORIDE CRYS ER 20 MEQ PO TBCR
40.0000 meq | EXTENDED_RELEASE_TABLET | ORAL | Status: DC
  Filled 2021-02-04: qty 2

## 2021-02-04 MED ORDER — DICLOFENAC SODIUM 75 MG PO TBEC
75.0000 mg | DELAYED_RELEASE_TABLET | Freq: Two times a day (BID) | ORAL | Status: DC
  Administered 2021-02-05: 75 mg via ORAL
  Filled 2021-02-04 (×2): qty 1

## 2021-02-04 MED ORDER — HEPARIN BOLUS VIA INFUSION
3600.0000 [IU] | Freq: Once | INTRAVENOUS | Status: DC
  Filled 2021-02-04: qty 3600

## 2021-02-04 MED ORDER — ASPIRIN 81 MG PO CHEW
324.0000 mg | CHEWABLE_TABLET | Freq: Once | ORAL | Status: AC
  Administered 2021-02-04: 324 mg via ORAL
  Filled 2021-02-04: qty 4

## 2021-02-04 MED ORDER — ACETAMINOPHEN 500 MG PO TABS
1000.0000 mg | ORAL_TABLET | Freq: Once | ORAL | Status: AC
  Administered 2021-02-04: 1000 mg via ORAL
  Filled 2021-02-04: qty 2

## 2021-02-04 MED ORDER — TRAZODONE HCL 100 MG PO TABS
100.0000 mg | ORAL_TABLET | Freq: Every day | ORAL | Status: DC
  Administered 2021-02-04 – 2021-02-08 (×5): 100 mg via ORAL
  Filled 2021-02-04 (×5): qty 2

## 2021-02-04 MED ORDER — LORAZEPAM 1 MG PO TABS
1.0000 mg | ORAL_TABLET | Freq: Every day | ORAL | Status: DC | PRN
  Administered 2021-02-05 – 2021-02-08 (×3): 1 mg via ORAL
  Filled 2021-02-04 (×3): qty 1

## 2021-02-04 MED ORDER — OXYCODONE-ACETAMINOPHEN 5-325 MG PO TABS
1.0000 | ORAL_TABLET | ORAL | Status: DC | PRN
  Administered 2021-02-05 – 2021-02-06 (×2): 1 via ORAL
  Filled 2021-02-04 (×3): qty 1

## 2021-02-04 MED ORDER — SODIUM CHLORIDE 0.9 % IV SOLN
1.0000 g | INTRAVENOUS | Status: DC
  Filled 2021-02-04: qty 10

## 2021-02-04 MED ORDER — ALBUTEROL SULFATE (2.5 MG/3ML) 0.083% IN NEBU: 2.5000 mg | INHALATION_SOLUTION | RESPIRATORY_TRACT | Status: DC | PRN

## 2021-02-04 MED ORDER — ATORVASTATIN CALCIUM 20 MG PO TABS
20.0000 mg | ORAL_TABLET | Freq: Every day | ORAL | Status: DC
  Administered 2021-02-04 – 2021-02-08 (×5): 20 mg via ORAL
  Filled 2021-02-04 (×5): qty 1

## 2021-02-04 MED ORDER — LIDOCAINE 5 % EX PTCH
1.0000 | MEDICATED_PATCH | CUTANEOUS | Status: DC
  Administered 2021-02-04 – 2021-02-08 (×5): 1 via TRANSDERMAL
  Filled 2021-02-04 (×7): qty 1

## 2021-02-04 MED ORDER — POTASSIUM CHLORIDE CRYS ER 20 MEQ PO TBCR
40.0000 meq | EXTENDED_RELEASE_TABLET | Freq: Once | ORAL | Status: DC
  Filled 2021-02-04: qty 2

## 2021-02-04 MED ORDER — FOLIC ACID 1 MG PO TABS
1.0000 mg | ORAL_TABLET | Freq: Every day | ORAL | Status: DC
  Administered 2021-02-04 – 2021-02-08 (×5): 1 mg via ORAL
  Filled 2021-02-04 (×5): qty 1

## 2021-02-04 MED ORDER — LACTATED RINGERS IV BOLUS
1000.0000 mL | Freq: Once | INTRAVENOUS | Status: AC
  Administered 2021-02-04: 1000 mL via INTRAVENOUS

## 2021-02-04 MED ORDER — LACTATED RINGERS IV BOLUS
500.0000 mL | Freq: Once | INTRAVENOUS | Status: AC
  Administered 2021-02-04: 500 mL via INTRAVENOUS

## 2021-02-04 MED ORDER — CALCIUM CARBONATE ANTACID 500 MG PO CHEW: 2.0000 | CHEWABLE_TABLET | Freq: Three times a day (TID) | ORAL | Status: DC | PRN

## 2021-02-04 MED ORDER — GABAPENTIN 600 MG PO TABS
600.0000 mg | ORAL_TABLET | Freq: Every morning | ORAL | Status: DC
  Administered 2021-02-05 – 2021-02-06 (×2): 600 mg via ORAL
  Filled 2021-02-04 (×2): qty 1

## 2021-02-04 MED ORDER — ACETAMINOPHEN 325 MG PO TABS
650.0000 mg | ORAL_TABLET | Freq: Four times a day (QID) | ORAL | Status: DC | PRN
  Administered 2021-02-08: 17:00:00 650 mg via ORAL
  Filled 2021-02-04: qty 2

## 2021-02-04 MED ORDER — CYCLOBENZAPRINE HCL 10 MG PO TABS
5.0000 mg | ORAL_TABLET | Freq: Two times a day (BID) | ORAL | Status: DC
  Administered 2021-02-04 – 2021-02-05 (×2): 5 mg via ORAL
  Filled 2021-02-04 (×2): qty 1

## 2021-02-04 MED ORDER — LACTATED RINGERS IV BOLUS: 1000.0000 mL | Freq: Once | INTRAVENOUS | Status: DC

## 2021-02-04 MED ORDER — IOHEXOL 350 MG/ML SOLN
80.0000 mL | Freq: Once | INTRAVENOUS | Status: AC | PRN
  Administered 2021-02-04: 80 mL via INTRAVENOUS
  Filled 2021-02-04: qty 80

## 2021-02-04 MED ORDER — ASPIRIN EC 81 MG PO TBEC
81.0000 mg | DELAYED_RELEASE_TABLET | Freq: Every day | ORAL | Status: DC
  Administered 2021-02-05 – 2021-02-07 (×3): 81 mg via ORAL
  Filled 2021-02-04 (×3): qty 1

## 2021-02-04 MED ORDER — POTASSIUM CHLORIDE 10 MEQ/100ML IV SOLN: 10.0000 meq | INTRAVENOUS | Status: DC

## 2021-02-04 MED ORDER — POTASSIUM CHLORIDE 10 MEQ/100ML IV SOLN
10.0000 meq | INTRAVENOUS | Status: DC
Start: 2021-02-04 — End: 2021-02-05
  Administered 2021-02-04 – 2021-02-05 (×4): 10 meq via INTRAVENOUS
  Filled 2021-02-04 (×3): qty 100

## 2021-02-04 MED ORDER — FLUORESCEIN SODIUM 1 MG OP STRP
1.0000 | ORAL_STRIP | Freq: Once | OPHTHALMIC | Status: AC
  Administered 2021-02-04: 1 via OPHTHALMIC
  Filled 2021-02-04: qty 1

## 2021-02-04 MED ORDER — THIAMINE HCL 100 MG PO TABS
100.0000 mg | ORAL_TABLET | Freq: Every day | ORAL | Status: DC
  Administered 2021-02-05: 09:00:00 100 mg via ORAL
  Filled 2021-02-04: qty 1

## 2021-02-04 MED ORDER — SODIUM CHLORIDE 0.9 % IV SOLN
500.0000 mg | Freq: Once | INTRAVENOUS | Status: AC
  Administered 2021-02-04: 500 mg via INTRAVENOUS
  Filled 2021-02-04: qty 500

## 2021-02-04 MED ORDER — ALBUTEROL SULFATE HFA 108 (90 BASE) MCG/ACT IN AERS
2.0000 | INHALATION_SPRAY | RESPIRATORY_TRACT | Status: DC | PRN
Start: 2021-02-04 — End: 2021-02-04

## 2021-02-04 MED ORDER — POTASSIUM CHLORIDE 10 MEQ/100ML IV SOLN
10.0000 meq | INTRAVENOUS | Status: DC
Start: 2021-02-04 — End: 2021-02-04
  Filled 2021-02-04: qty 100

## 2021-02-04 MED ORDER — METOPROLOL SUCCINATE ER 50 MG PO TB24
25.0000 mg | ORAL_TABLET | Freq: Every day | ORAL | Status: DC
  Administered 2021-02-04 – 2021-02-08 (×5): 25 mg via ORAL
  Filled 2021-02-04 (×5): qty 1

## 2021-02-04 NOTE — ED Notes (Signed)
EKG not performed in triage. ED NT contacted to obtain EKG.

## 2021-02-04 NOTE — ED Notes (Signed)
Pt states she does not want any more medications till she goes to her room upstairs. Pts son at bedside. Report given, room is just being cleaned

## 2021-02-04 NOTE — ED Notes (Signed)
Pt states the next run of IV potassium she does not want till she gets to a room upstairs

## 2021-02-04 NOTE — ED Notes (Signed)
Pts son states pt is here for an infection. Pt states she does get confused at times. Pt resting in bed on cardiac, bp and pulse ox monitor. Son at bedside.

## 2021-02-04 NOTE — ED Notes (Signed)
Patient is resting comfortably. Son at bedside. Son and patient updated on POC.

## 2021-02-04 NOTE — ED Notes (Signed)
Lab contacted to recollect BMP.

## 2021-02-04 NOTE — ED Notes (Signed)
Receiving nurse aware that pt does not want any further medications till she gets to her room on the floor. Receiving RN stated that was fine.

## 2021-02-04 NOTE — H&P (Signed)
History and Physical    Amy Becker ZOX:[id number] DOB: 05-15-1951 DOA: 03-01-2021  Referring MD/NP/PA:   PCP: Orson Aloe, MD   Patient coming from:  The patient is coming from home.  At baseline, pt is independent for most of ADL.        Chief Complaint: Cough, shortness of breath, eye floaters, diarrhea, multiple joint pain  HPI: Amy Becker is a 70 y.o. female with medical history significant of hypertension, depression with anxiety, hyponatremia, alcohol abuse, tobacco abuse, who presents with cough, shortness of breath, eye floaters, diarrhea.  Patient states she has been sick for more than 1 week.  She has dry cough, shortness of breath, subjective fever, chills.  No chest pain.  Her body temperature is 97.8 in the ED.  She also reports diarrhea in the past 3 days.  She has 1-3 times of watery diarrhea each day.  No nausea, vomiting or abdominal pain.  No symptoms of UTI.  Patient states she has multiple joint pain, including both hands, right shoulder, lower back and body aches.  She reports bilateral visual floaters.  No unilateral numbness or tingling to extremities.  No facial droop or slurred speech.  No eye injury or eye pain.  Patient states that she did not drink alcohol for more than 5 days.  ED Course: pt was found to have WBC 15.0, thrombocytopenia with platelet of 53, lactic acid of 2.2, troponin level 67, negative COVID PCR, sodium 130, potassium 2.9, magnesium 1.7, negative urinalysis for UTI, renal function okay, temperature 97.8, blood pressure 152/74, heart rate 95, RR 20, oxygen saturation 91-94% on room air.  X-ray of right shoulder is negative for acute injury.  CT abdomen/pelvis is negative for acute intra abdominal issues.  CT angiogram of chest is negative for PE, but showed infiltration in left upper lobe.  Patient is admitted to MedSurg bed as inpatient  Review of Systems:   General: Has subjective fevers, chills, no body weight gain, has poor  appetite, has fatigue HEENT: no blurry vision, hearing changes or sore throat Respiratory: Has dyspnea, coughing, no wheezing CV: no chest pain, no palpitations GI: no nausea, vomiting, abdominal pain, has diarrhea, no constipation GU: no dysuria, burning on urination, increased urinary frequency, hematuria  Ext: no leg edema Neuro: no unilateral weakness, numbness, or tingling, no vision change or hearing loss.  Skin: no rash, no skin tear. MSK: Has multiple joints pain  heme: No easy bruising.  Travel history: No recent long distant travel.  Allergy:  Allergies  Allergen Reactions   Hydromorphone Hives   Cefaclor Hives   Tetracycline Rash    Past Medical History:  Diagnosis Date   Alcohol abuse    Hypertension    Hyponatremia 01/26/2019    Past Surgical History:  Procedure Laterality Date   ABDOMINAL HYSTERECTOMY      Social History:  reports that she has been smoking cigarettes. She has a 40.00 pack-year smoking history. She has never used smokeless tobacco. She reports current alcohol use. She reports that she does not use drugs.  Family History:  Family History  Problem Relation Age of Onset   CAD Mother      Prior to Admission medications   Medication Sig Start Date End Date Taking? Authorizing Provider  calcium carbonate (TUMS - DOSED IN MG ELEMENTAL CALCIUM) 500 MG chewable tablet Chew 2 tablets (400 mg of elemental calcium total) by mouth 3 (three) times daily as needed for indigestion or heartburn. 01/29/19  Ronaldo Miyamoto, Tyrone A, DO  diclofenac (VOLTAREN) 75 MG EC tablet Take 75 mg by mouth 2 (two) times daily with a meal.    [provider]  folic acid (FOLVITE) 1 MG tablet Take 1 tablet (1 mg total) by mouth daily. 01/30/19   Margie Ege A, DO  gabapentin (NEURONTIN) 600 MG tablet Take 600 mg by mouth 2 (two) times a day.    [provider]  lisinopril (ZESTRIL) 40 MG tablet Take 1 tablet (40 mg total) by mouth daily. 01/30/19 03/01/19  Margie Ege A, DO  LORazepam (ATIVAN) 1 MG tablet Take 1 mg by mouth daily as needed for anxiety.    [provider]  Multiple Vitamin (MULTIVITAMIN WITH MINERALS) TABS tablet Take 1 tablet by mouth daily. 01/30/19   Margie Ege A, DO  sertraline (ZOLOFT) 100 MG tablet Take 100 mg by mouth daily.    [provider]  traZODone (DESYREL) 100 MG tablet Take 100 mg by mouth at bedtime.    [provider]    Physical Exam: Vitals:   02/14/2021 1041 01/27/2021 1043 01/31/2021 1316  BP: (!) 195/109  (!) 152/74  Pulse: 79  95  Resp: 20  18  Temp: 97.8 F (36.6 C)    TempSrc: Oral    SpO2: 91%  94%  Weight:  61.2 kg   Height:  5\' 1"  (1.549 m)    General: Not in acute distress HEENT:       Eyes: PERRL, EOMI, no scleral icterus.       ENT: No discharge from the ears and nose, no pharynx injection, no tonsillar enlargement.        Neck: No JVD, no bruit, no mass felt. Heme: No neck lymph node enlargement. Cardiac: S1/S2, RRR, No murmurs, No gallops or rubs. Respiratory: No rales, wheezing, rhonchi or rubs. GI: Soft, nondistended, nontender, no rebound pain, no organomegaly, BS present. GU: No hematuria Ext: No pitting leg edema bilaterally. 1+DP/PT pulse bilaterally. Musculoskeletal: No joint deformities, No joint redness or warmth, no limitation of ROM in spin. Skin: No rashes.  Neuro: Alert, oriented X3, cranial nerves II-XII grossly intact, moves all extremities normally. Psych: Patient is not psychotic, no suicidal or hemocidal ideation.  Labs on Admission: I have personally reviewed following labs and imaging studies  CBC: Recent Labs  Lab 02/07/2021 1045  WBC 15.0*  HGB 15.9*  HCT 44.6  MCV 89.6  PLT 53*   Basic Metabolic Panel: Recent Labs  Lab 01/31/2021 1356 01/21/2021 1440  NA  --  130*  K  --  2.9*  CL  --  97*  CO2  --  18*  GLUCOSE  --  80  BUN  --  20  CREATININE 0.50 0.66  CALCIUM  --  8.8*  MG  --  1.7   GFR: Estimated Creatinine  Clearance: 54.2 mL/min (by C-G formula based on SCr of 0.66 mg/dL). Liver Function Tests: Recent Labs  Lab 02/01/2021 1440  AST 34  ALT 11  ALKPHOS 101  BILITOT 1.9*  PROT 6.0*  ALBUMIN 2.6*   No results for input(s): LIPASE, AMYLASE in the last 168 hours. No results for input(s): AMMONIA in the last 168 hours. Coagulation Profile: No results for input(s): INR, PROTIME in the last 168 hours. Cardiac Enzymes: No results for input(s): CKTOTAL, CKMB, CKMBINDEX, TROPONINI in the last 168 hours. BNP (last 3 results) No results for input(s): PROBNP in the last 8760 hours. HbA1C: No results for input(s): HGBA1C in  the last 72 hours. CBG: No results for input(s): GLUCAP in the last 168 hours. Lipid Profile: No results for input(s): CHOL, HDL, LDLCALC, TRIG, CHOLHDL, LDLDIRECT in the last 72 hours. Thyroid Function Tests: No results for input(s): TSH, T4TOTAL, FREET4, T3FREE, THYROIDAB in the last 72 hours. Anemia Panel: No results for input(s): VITAMINB12, FOLATE, FERRITIN, TIBC, IRON, RETICCTPCT in the last 72 hours. Urine analysis:    Component Value Date/Time   COLORURINE YELLOW (A) 02/03/2021 1337   APPEARANCEUR HAZY (A) 02/15/2021 1337   LABSPEC 1.032 (H) 01/22/2021 1337   PHURINE 6.0 02/01/2021 1337   GLUCOSEU NEGATIVE 01/28/2021 1337   HGBUR MODERATE (A) 01/29/2021 1337   BILIRUBINUR NEGATIVE 01/19/2021 1337   KETONESUR 20 (A) 01/22/2021 1337   PROTEINUR 30 (A) 02/01/2021 1337   NITRITE NEGATIVE 01/25/2021 1337   LEUKOCYTESUR NEGATIVE 02/03/2021 1337   Sepsis Labs: (procalcitonin:4,lacticidven:4) ) Recent Results (from the past 240 hour(s))  Resp Panel by RT-PCR (Flu A&B, Covid) Nasopharyngeal Swab     Status: None   Collection Time: 01/27/2021  1:37 PM   Specimen: Nasopharyngeal Swab; Nasopharyngeal(NP) swabs in vial transport medium  Result Value Ref Range Status   SARS Coronavirus 2 by RT PCR NEGATIVE NEGATIVE Final    Comment: (NOTE) SARS-CoV-2 target  nucleic acids are NOT DETECTED.  The SARS-CoV-2 RNA is generally detectable in upper respiratory specimens during the acute phase of infection. The lowest concentration of SARS-CoV-2 viral copies this assay can detect is 138 copies/mL. A negative result does not preclude SARS-Cov-2 infection and should not be used as the sole basis for treatment or other patient management decisions. A negative result may occur with  improper specimen collection/handling, submission of specimen other than nasopharyngeal swab, presence of viral mutation(s) within the areas targeted by this assay, and inadequate number of viral copies(<138 copies/mL). A negative result must be combined with clinical observations, patient history, and epidemiological information. The expected result is Negative.  Fact Sheet for Patients:  BloggerCourse.com  Fact Sheet for Healthcare Providers:  SeriousBroker.it  This test is no t yet approved or cleared by the [place] FDA and  has been authorized for detection and/or diagnosis of SARS-CoV-2 by FDA under an Emergency Use Authorization (EUA). This EUA will remain  in effect (meaning this test can be used) for the duration of the COVID-19 declaration under Section 564(b)(1) of the Act, 21 U.S.C.section 360bbb-3(b)(1), unless the authorization is terminated  or revoked sooner.       Influenza A by PCR NEGATIVE NEGATIVE Final   Influenza B by PCR NEGATIVE NEGATIVE Final    Comment: (NOTE) The Xpert Xpress SARS-CoV-2/FLU/RSV plus assay is intended as an aid in the diagnosis of influenza from Nasopharyngeal swab specimens and should not be used as a sole basis for treatment. Nasal washings and aspirates are unacceptable for Xpert Xpress SARS-CoV-2/FLU/RSV testing.  Fact Sheet for Patients: BloggerCourse.com  Fact Sheet for Healthcare  Providers: SeriousBroker.it  This test is not yet approved or cleared by the [place] FDA and has been authorized for detection and/or diagnosis of SARS-CoV-2 by FDA under an Emergency Use Authorization (EUA). This EUA will remain in effect (meaning this test can be used) for the duration of the COVID-19 declaration under Section 564(b)(1) of the Act, 21 U.S.C. section 360bbb-3(b)(1), unless the authorization is terminated or revoked.  Performed at [facility], [address] [place], [place] [address]      Radiological Exams on Admission: DG Shoulder Right  Result Date: 02/12/2021  CLINICAL DATA:  Right shoulder pain. EXAM: RIGHT SHOULDER - 2+ VIEW COMPARISON:  None. FINDINGS: Right shoulder is located without a fracture. Visualized right ribs are intact. Normal alignment at the right [facility] joint. IMPRESSION: No acute abnormality to the right shoulder. Electronically Signed   By: Richarda Overlie M.D.   On: 02/08/2021 15:48   CT Angio Chest PE W and/or Wo Contrast  Result Date: 02/16/2021 CLINICAL DATA:  Nausea, diarrhea, high prob PE suspected EXAM: CT ANGIOGRAPHY CHEST CT ABDOMEN AND PELVIS WITH CONTRAST TECHNIQUE: Multidetector CT imaging of the chest was performed using the standard protocol during bolus administration of intravenous contrast. Multiplanar CT image reconstructions and MIPs were obtained to evaluate the vascular anatomy. Multidetector CT imaging of the abdomen and pelvis was performed using the standard protocol during bolus administration of intravenous contrast. CONTRAST:  73mL OMNIPAQUE IOHEXOL 350 MG/ML SOLN COMPARISON:  None. FINDINGS: CTA CHEST FINDINGS Cardiovascular: Heart size normal. No pericardial effusion. Satisfactory opacification of pulmonary arteries noted, and there is no evidence of pulmonary emboli. Scattered coronary calcifications. Adequate contrast opacification of the thoracic aorta with no evidence of dissection,  aneurysm, or stenosis. There is classic 3-vessel brachiocephalic arch anatomy without proximal stenosis. Moderate partially calcified plaque in the arch and descending thoracic segment. Mediastinum/Nodes: No mass or adenopathy. Lungs/Pleura: No significant pleural effusion. No pneumothorax. Focal infiltrate in the left upper lobe near the hilum. Subsegmental dependent atelectasis in the left lower lobe. Musculoskeletal: Anterior vertebral endplate spurring at multiple levels in the lower thoracic spine. Review of the MIP images confirms the above findings. CT ABDOMEN and PELVIS FINDINGS Hepatobiliary: No focal liver abnormality is seen. No gallstones, gallbladder wall thickening, or biliary dilatation. Pancreas: Unremarkable. No pancreatic ductal dilatation or surrounding inflammatory changes. Spleen: Normal in size without focal abnormality. Adrenals/Urinary Tract: Adrenal glands are unremarkable. Kidneys are normal, without renal calculi, focal lesion, or hydronephrosis. Bladder is distended. Stomach/Bowel: Stomach decompressed. Small bowel is nondilated. Appendix not discretely identified. Moderate proximal colonic fecal material. There is mild gaseous distention of the transverse colon and a portion of the sigmoid segment. No significant diverticular disease nor evidence of obstruction. Vascular/Lymphatic: Moderate calcified aortoiliac plaque without aneurysm. Portal vein patent. No abdominal or pelvic adenopathy. Reproductive: Status post hysterectomy. No adnexal masses. Other: No ascites.  No free air. Musculoskeletal: No acute or significant osseous findings. Review of the MIP images confirms the above findings. IMPRESSION: 1. Negative for acute PE or thoracic aortic dissection. 2. Left upper lobe infiltrate possibly pneumonia. Followup PA and lateral chest X-ray is recommended in 3-4 weeks following trial of antibiotic therapy to ensure resolution and exclude underlying malignancy. 3. Mild colonic distension  with proximal fecal material, no evidence of obstruction. Electronically Signed   By: Corlis Leak M.D.   On: 02/11/2021 14:42   CT ABDOMEN PELVIS W CONTRAST  Result Date: 02/16/2021 CLINICAL DATA:  Nausea, diarrhea, high prob PE suspected EXAM: CT ANGIOGRAPHY CHEST CT ABDOMEN AND PELVIS WITH CONTRAST TECHNIQUE: Multidetector CT imaging of the chest was performed using the standard protocol during bolus administration of intravenous contrast. Multiplanar CT image reconstructions and MIPs were obtained to evaluate the vascular anatomy. Multidetector CT imaging of the abdomen and pelvis was performed using the standard protocol during bolus administration of intravenous contrast. CONTRAST:  19mL OMNIPAQUE IOHEXOL 350 MG/ML SOLN COMPARISON:  None. FINDINGS: CTA CHEST FINDINGS Cardiovascular: Heart size normal. No pericardial effusion. Satisfactory opacification of pulmonary arteries noted, and there is no evidence of pulmonary emboli. Scattered coronary calcifications. Adequate  contrast opacification of the thoracic aorta with no evidence of dissection, aneurysm, or stenosis. There is classic 3-vessel brachiocephalic arch anatomy without proximal stenosis. Moderate partially calcified plaque in the arch and descending thoracic segment. Mediastinum/Nodes: No mass or adenopathy. Lungs/Pleura: No significant pleural effusion. No pneumothorax. Focal infiltrate in the left upper lobe near the hilum. Subsegmental dependent atelectasis in the left lower lobe. Musculoskeletal: Anterior vertebral endplate spurring at multiple levels in the lower thoracic spine. Review of the MIP images confirms the above findings. CT ABDOMEN and PELVIS FINDINGS Hepatobiliary: No focal liver abnormality is seen. No gallstones, gallbladder wall thickening, or biliary dilatation. Pancreas: Unremarkable. No pancreatic ductal dilatation or surrounding inflammatory changes. Spleen: Normal in size without focal abnormality. Adrenals/Urinary Tract:  Adrenal glands are unremarkable. Kidneys are normal, without renal calculi, focal lesion, or hydronephrosis. Bladder is distended. Stomach/Bowel: Stomach decompressed. Small bowel is nondilated. Appendix not discretely identified. Moderate proximal colonic fecal material. There is mild gaseous distention of the transverse colon and a portion of the sigmoid segment. No significant diverticular disease nor evidence of obstruction. Vascular/Lymphatic: Moderate calcified aortoiliac plaque without aneurysm. Portal vein patent. No abdominal or pelvic adenopathy. Reproductive: Status post hysterectomy. No adnexal masses. Other: No ascites.  No free air. Musculoskeletal: No acute or significant osseous findings. Review of the MIP images confirms the above findings. IMPRESSION: 1. Negative for acute PE or thoracic aortic dissection. 2. Left upper lobe infiltrate possibly pneumonia. Followup PA and lateral chest X-ray is recommended in 3-4 weeks following trial of antibiotic therapy to ensure resolution and exclude underlying malignancy. 3. Mild colonic distension with proximal fecal material, no evidence of obstruction. Electronically Signed   By: Corlis Leak M.D.   On: 02/16/2021 14:42     EKG: I have personally reviewed.  Sinus rhythm, QTC 451, T wave version in V3 and Q waves in lead III  Assessment/Plan Principal Problem:   CAP (community acquired pneumonia) Active Problems:   Hyponatremia   ETOH abuse   Tobacco abuse   Severe sepsis (HCC)   Hypokalemia   Hypertension   Visual floaters   Diarrhea   Thrombocytopenia (HCC)   Depression with anxiety   Joint pain   Elevated troponin   HLD (hyperlipidemia)   Severe sepsis due to CAP (community acquired pneumonia): Patient meets criteria for severe sepsis with leukocytosis with WBC 15.0, tachycardia with heart rate 95.  Lactic acid is elevated at 2.2.  Currently hemodynamically stable.  CT angiograms negative for PE, but showed left upper lobe  infiltration.  - Will admit to med-surg bed as inpt - IV Rocephin and azithromycin - Mucinex for cough  - Bronchodilators - Urine legionella and S. pneumococcal antigen - Follow up blood culture x2, sputum culture - will get Procalcitonin and trend lactic acid level per sepsis protocol - IVF: 1.5L of LR bolus in ED, followed by 75 mL per hour of NS   Hyponatremia: Na 130.  Likely multifactorial etiology, including dehydration, diarrhea and poor oral intake -on IV fluid as above  ETOH abuse: Patient states that she did not drink alcohol for 5 days.  Currently no signs of withdrawal -give Vb1 and folic acid -Observe closely for any signs for withdrawal  Tobacco abuse -Nicotine patch  Hypokalemia: -IV KCl 10 mEq x 6 -Mg 1.7 --> give 2 g of magnesium sulfate -Check phosphorus level  Hypertension -IV hydralazine as needed -Continue home lisinopril  Visual floaters: This is bilateral, very low suspicions for stroke. -ED physician consulted Dr. Brooke Dare of  ophthalmology, he will see patient tomorrow  Diarrhea -IV fluid as above -Check C. difficile and GI pathogen panel  Thrombocytopenia (HCC): Platelet 83.  No bleeding tendency.  Possibly due to ongoing infection -Check LDH and peripheral smear  Depression with anxiety -Continue home medications  Joint pain: Etiology is not clear.  Patient with multiple joint pain -Supportive care, as needed Tylenol and Percocet  HLD:  -lipitor  Elevated troponin: Troponin level 67, 69, no chest pain, most likely due to demand ischemia -Trend troponin -Aspirin -Lipitor  -Check A1c, FLP         DVT ppx: SCD Code Status: Full code Family Communication:  Yes, patient's son   at bed side Disposition Plan:  Anticipate discharge back to previous environment Consults called:  Dr. Brooke DareKing of ophthalmology by ED physician Admission status and Level of care: Med-Surg:  as inpt   Status is: Inpatient  Remains inpatient appropriate  because:Inpatient level of care appropriate due to severity of illness  Dispo: The patient is from: Home              Anticipated d/c is to: Home              Patient currently is not medically stable to d/c.   Difficult to place patient No              Date of Service 02/15/2021    Lorretta HarpXilin Romilda Proby [facility]   If 7PM-7AM, please contact night-coverage [email or web address] 02/01/2021, 6:48 PM

## 2021-02-04 NOTE — ED Notes (Signed)
EKG performed at bedside by Sam, RN. Shown to Z. Katrinka Blazing, MD.

## 2021-02-04 NOTE — Plan of Care (Signed)

## 2021-02-04 NOTE — ED Notes (Signed)
Mara RN aware of assigned bed °

## 2021-02-04 NOTE — ED Triage Notes (Signed)
Pt comes into the ED via EMS from home with c/o just not feeling well with nausea and diarrhea , flare up with arthritis pain. Strong urine odor  190/100 90%RA, on 2L  now 98 80's HR

## 2021-02-04 NOTE — ED Triage Notes (Signed)
Pt here with generalized pain all over. Pt also having back spasms that started last week as well as diarrhea. Pt stable in triage.

## 2021-02-04 NOTE — ED Notes (Signed)
Patient transported to CT 

## 2021-02-04 NOTE — ED Notes (Signed)
Rate of IV potassium changed to 28ml/hr due to pt complaining of stinging.

## 2021-02-04 NOTE — ED Notes (Signed)
ED Provider at bedside. 

## 2021-02-04 NOTE — ED Notes (Signed)
Pt being transported to unit bed with a free floating RN

## 2021-02-04 NOTE — ED Notes (Signed)
Pt states she is hungry. RN unable to find a meal order. RN messaged provider asking if she can eat

## 2021-02-04 NOTE — ED Notes (Signed)
Patient presents to the ED with son. Patient with multiple complaints. Patient reports diarrhea x 5 days, as well as weakness, SOB, right arm pain, right shoulder pain, pain to all fingers ("I think it's rheumatoid"), left and right hip pain, mid-back pain, as well as cough. Patient denies chest pain.

## 2021-02-04 NOTE — ED Provider Notes (Signed)
[facility] Emergency Department Provider Note  ____________________________________________   Event Date/Time   First MD Initiated Contact with Patient 01/21/2021 1300     (approximate)  I have reviewed the triage vital signs and the nursing notes.   HISTORY  Chief Complaint Shoulder Pain   HPI Amy Becker is a 70 y.o. female with a past medical history of remote alcohol abuse with patient stating she has not had any alcohol in several months without other significant significant past medical history who presents accompanied by her son for assessment of several complaints.  She states she has had a cough for several weeks and has had diarrhea for between 1 and 2 weeks.  She has not had any nausea or vomiting burning with urination or abdominal pain.  She states that over the last 3 to 4 days she has developed fairly significant pain in her right shoulder and right upper back as well as in her right hip.  She denies any injuries or falls or similar pain on the left shoulder or upper extremity.  Denies any pain in her hips knees or ankles bilaterally.  No prior similar episodes or clear leaving aggravating factors.  States she feels weak in her right arm from the pain in her right shoulder.  Denies any other acute concerns at this time.         Past Medical History:  Diagnosis Date   Alcohol abuse    Hypertension    Hyponatremia 01/26/2019    Patient Active Problem List   Diagnosis Date Noted   CAP (community acquired pneumonia) 01/29/2021   Severe sepsis (HCC) 01/17/2021   Hypokalemia 02/03/2021   Hypertension    Blurry vision    Diarrhea    Thrombocytopenia (HCC)    Depression with anxiety    Joint pain    Elevated troponin    Hyponatremia 01/26/2019   ETOH abuse 01/26/2019   Tobacco abuse 01/26/2019    Past Surgical History:  Procedure Laterality Date   ABDOMINAL HYSTERECTOMY      Prior to Admission medications   Medication Sig Start  Date End Date Taking? Authorizing Provider  calcium carbonate (TUMS - DOSED IN MG ELEMENTAL CALCIUM) 500 MG chewable tablet Chew 2 tablets (400 mg of elemental calcium total) by mouth 3 (three) times daily as needed for indigestion or heartburn. 01/29/19   Margie Ege A, DO  diclofenac (VOLTAREN) 75 MG EC tablet Take 75 mg by mouth 2 (two) times daily with a meal.    [provider]  folic acid (FOLVITE) 1 MG tablet Take 1 tablet (1 mg total) by mouth daily. 01/30/19   Margie Ege A, DO  gabapentin (NEURONTIN) 600 MG tablet Take 600 mg by mouth 2 (two) times a day.    [provider]  lisinopril (ZESTRIL) 40 MG tablet Take 1 tablet (40 mg total) by mouth daily. 01/30/19 03/01/19  Margie Ege A, DO  LORazepam (ATIVAN) 1 MG tablet Take 1 mg by mouth daily as needed for anxiety.    [provider]  Multiple Vitamin (MULTIVITAMIN WITH MINERALS) TABS tablet Take 1 tablet by mouth daily. 01/30/19   Margie Ege A, DO  sertraline (ZOLOFT) 100 MG tablet Take 100 mg by mouth daily.    [provider]  traZODone (DESYREL) 100 MG tablet Take 100 mg by mouth at bedtime.    [provider]    Allergies Hydromorphone, Cefaclor, and Tetracycline  Family History  Problem Relation Age of  Onset   CAD Mother     Social History Social History   Tobacco Use   Smoking status: Every Day    Packs/day: 1.00    Years: 40.00    Pack years: 40.00    Types: Cigarettes   Smokeless tobacco: Never  Vaping Use   Vaping Use: Never used  Substance Use Topics   Alcohol use: Yes   Drug use: Never    Review of Systems  Review of Systems  Constitutional:  Positive for chills and malaise/fatigue. Negative for fever.  HENT:  Negative for sore throat.   Eyes:  Negative for pain.  Respiratory:  Positive for cough and shortness of breath. Negative for stridor.   Cardiovascular:  Negative for chest pain.  Gastrointestinal:  Positive for diarrhea. Negative for vomiting.   Musculoskeletal:  Positive for back pain (mid back), joint pain (R shoulder, R hip) and myalgias (R shoulder, R upper back).  Skin:  Negative for rash.  Neurological:  Positive for dizziness and weakness. Negative for seizures, loss of consciousness and headaches.  Psychiatric/Behavioral:  Negative for suicidal ideas.   All other systems reviewed and are negative.    ____________________________________________   PHYSICAL EXAM:  VITAL SIGNS: ED Triage Vitals  Enc Vitals Group     BP 01/26/2021 1041 (!) 195/109     Pulse Rate 01/27/2021 1041 79     Resp 01/26/2021 1041 20     Temp 01/26/2021 1041 97.8 F (36.6 C)     Temp Source 02/07/2021 1041 Oral     SpO2 02/06/2021 1041 91 %     Weight 02/01/2021 1043 135 lb (61.2 kg)     Height 01/21/2021 1043 5\' 1"  (1.549 m)     Head Circumference --      Peak Flow --      Pain Score 01/22/2021 1043 10     Pain Loc --      Pain Edu? --      Excl. in GC? --    Vitals:   01/21/2021 1041 02/01/2021 1316  BP: (!) 195/109 (!) 152/74  Pulse: 79 95  Resp: 20 18  Temp: 97.8 F (36.6 C)   SpO2: 91% 94%   Physical Exam Vitals and nursing note reviewed.  Constitutional:      General: She is not in acute distress.    Appearance: She is well-developed.  HENT:     Head: Normocephalic and atraumatic.     Right Ear: External ear normal.     Left Ear: External ear normal.     Nose: Nose normal.     Mouth/Throat:     Mouth: Mucous membranes are dry.  Eyes:     Conjunctiva/sclera: Conjunctivae normal.  Cardiovascular:     Rate and Rhythm: Normal rate and regular rhythm.     Heart sounds: No murmur heard. Pulmonary:     Effort: Pulmonary effort is normal. No respiratory distress.     Breath sounds: Rhonchi present.  Abdominal:     Palpations: Abdomen is soft.     Tenderness: There is no abdominal tenderness.  Musculoskeletal:     Cervical back: Neck supple.  Skin:    General: Skin is warm and dry.     Capillary Refill: Capillary refill takes 2 to 3  seconds.  Neurological:     Mental Status: She is alert and oriented to person, place, and time.    There is some tenderness over the mid T-spine and right posterior hip without any significant  tenderness over the C or L-spine.  No significant overlying skin changes.  There are some mild tenderness of the right posterior shoulder patient has very limited range of motion seemingly secondary to pain and is very painful on passive range of motion of the right shoulder.  She seems to have symmetric grip strength in the right hand compared to left.  Sensation is intact in the distribution of the radial ulnar and median nerves in the bilateral upper extremities.  She has some mottling of her hands and feet and a little bit of erythema and swelling around the right thumb which she states is chronic and related to her arthritis.  Abdomen is soft nontender throughout.  She is alert and oriented and current nerves II through XII grossly intact.  She has symmetric strength in her lower extremities and sensation is intact to light touch of the lower extremities.  2+ DP pulses. ____________________________________________   LABS (all labs ordered are listed, but only abnormal results are displayed)  Labs Reviewed  CBC - Abnormal; Notable for the following components:      Result Value   WBC 15.0 (*)    Hemoglobin 15.9 (*)    Platelets 53 (*)    All other components within normal limits  URINALYSIS, COMPLETE (UACMP) WITH MICROSCOPIC - Abnormal; Notable for the following components:   Color, Urine YELLOW (*)    APPearance HAZY (*)    Specific Gravity, Urine 1.032 (*)    Hgb urine dipstick MODERATE (*)    Ketones, ur 20 (*)    Protein, ur 30 (*)    RBC / HPF >50 (*)    Bacteria, UA RARE (*)    All other components within normal limits  LACTIC ACID, PLASMA - Abnormal; Notable for the following components:   Lactic Acid, Venous 2.2 (*)    All other components within normal limits  BASIC METABOLIC PANEL -  Abnormal; Notable for the following components:   Sodium 130 (*)    Potassium 2.9 (*)    Chloride 97 (*)    CO2 18 (*)    Calcium 8.8 (*)    All other components within normal limits  HEPATIC FUNCTION PANEL - Abnormal; Notable for the following components:   Total Protein 6.0 (*)    Albumin 2.6 (*)    Total Bilirubin 1.9 (*)    Bilirubin, Direct 0.4 (*)    Indirect Bilirubin 1.5 (*)    All other components within normal limits  TROPONIN I (HIGH SENSITIVITY) - Abnormal; Notable for the following components:   Troponin I (High Sensitivity) 67 (*)    All other components within normal limits  RESP PANEL BY RT-PCR (FLU A&B, COVID) ARPGX2  CULTURE, BLOOD (SINGLE)  URINE CULTURE  GASTROINTESTINAL PANEL BY PCR, STOOL (REPLACES STOOL CULTURE)  C DIFFICILE QUICK SCREEN W PCR REFLEX    MAGNESIUM  PROCALCITONIN  LACTIC ACID, PLASMA  HEMOGLOBIN A1C  LACTATE DEHYDROGENASE  SAVE SMEAR (SSMR)  PATHOLOGIST SMEAR REVIEW  PHOSPHORUS  CBG MONITORING, ED  I-STAT CREATININE, ED  TROPONIN I (HIGH SENSITIVITY)   ____________________________________________  EKG  Sinus rhythm with a ventricular rate of 98, normal axis, unremarkable intervals with some nonspecific ST changes in inferior leads. ____________________________________________  RADIOLOGY  ED MD interpretation: Plain film of the right shoulder is unremarkable for fracture dislocation.  CT a chest shows no evidence of PE or dissection but does show evidence of pneumonia in the left upper lobe.  No evidence of edema or other  clear acute thoracic process.  CT abdomen pelvis shows some colonic distention and proximal fecal material but no evidence of obstruction, diverticulitis, cholecystitis, pancreatitis, kidney stone, pyelonephritis or other acute abdominopelvic pathology.  Official radiology report(s): DG Shoulder Right  Result Date: 02/13/2021 CLINICAL DATA:  Right shoulder pain. EXAM: RIGHT SHOULDER - 2+ VIEW COMPARISON:  None.  FINDINGS: Right shoulder is located without a fracture. Visualized right ribs are intact. Normal alignment at the right [facility] joint. IMPRESSION: No acute abnormality to the right shoulder. Electronically Signed   By: Richarda Overlie M.D.   On: 01/20/2021 15:48   CT Angio Chest PE W and/or Wo Contrast  Result Date: 01/19/2021 CLINICAL DATA:  Nausea, diarrhea, high prob PE suspected EXAM: CT ANGIOGRAPHY CHEST CT ABDOMEN AND PELVIS WITH CONTRAST TECHNIQUE: Multidetector CT imaging of the chest was performed using the standard protocol during bolus administration of intravenous contrast. Multiplanar CT image reconstructions and MIPs were obtained to evaluate the vascular anatomy. Multidetector CT imaging of the abdomen and pelvis was performed using the standard protocol during bolus administration of intravenous contrast. CONTRAST:  80mL OMNIPAQUE IOHEXOL 350 MG/ML SOLN COMPARISON:  None. FINDINGS: CTA CHEST FINDINGS Cardiovascular: Heart size normal. No pericardial effusion. Satisfactory opacification of pulmonary arteries noted, and there is no evidence of pulmonary emboli. Scattered coronary calcifications. Adequate contrast opacification of the thoracic aorta with no evidence of dissection, aneurysm, or stenosis. There is classic 3-vessel brachiocephalic arch anatomy without proximal stenosis. Moderate partially calcified plaque in the arch and descending thoracic segment. Mediastinum/Nodes: No mass or adenopathy. Lungs/Pleura: No significant pleural effusion. No pneumothorax. Focal infiltrate in the left upper lobe near the hilum. Subsegmental dependent atelectasis in the left lower lobe. Musculoskeletal: Anterior vertebral endplate spurring at multiple levels in the lower thoracic spine. Review of the MIP images confirms the above findings. CT ABDOMEN and PELVIS FINDINGS Hepatobiliary: No focal liver abnormality is seen. No gallstones, gallbladder wall thickening, or biliary dilatation. Pancreas: Unremarkable. No  pancreatic ductal dilatation or surrounding inflammatory changes. Spleen: Normal in size without focal abnormality. Adrenals/Urinary Tract: Adrenal glands are unremarkable. Kidneys are normal, without renal calculi, focal lesion, or hydronephrosis. Bladder is distended. Stomach/Bowel: Stomach decompressed. Small bowel is nondilated. Appendix not discretely identified. Moderate proximal colonic fecal material. There is mild gaseous distention of the transverse colon and a portion of the sigmoid segment. No significant diverticular disease nor evidence of obstruction. Vascular/Lymphatic: Moderate calcified aortoiliac plaque without aneurysm. Portal vein patent. No abdominal or pelvic adenopathy. Reproductive: Status post hysterectomy. No adnexal masses. Other: No ascites.  No free air. Musculoskeletal: No acute or significant osseous findings. Review of the MIP images confirms the above findings. IMPRESSION: 1. Negative for acute PE or thoracic aortic dissection. 2. Left upper lobe infiltrate possibly pneumonia. Followup PA and lateral chest X-ray is recommended in 3-4 weeks following trial of antibiotic therapy to ensure resolution and exclude underlying malignancy. 3. Mild colonic distension with proximal fecal material, no evidence of obstruction. Electronically Signed   By: Corlis Leak M.D.   On: 01/23/2021 14:42   CT ABDOMEN PELVIS W CONTRAST  Result Date: 02/06/2021 CLINICAL DATA:  Nausea, diarrhea, high prob PE suspected EXAM: CT ANGIOGRAPHY CHEST CT ABDOMEN AND PELVIS WITH CONTRAST TECHNIQUE: Multidetector CT imaging of the chest was performed using the standard protocol during bolus administration of intravenous contrast. Multiplanar CT image reconstructions and MIPs were obtained to evaluate the vascular anatomy. Multidetector CT imaging of the abdomen and pelvis was performed using the standard protocol during bolus administration  of intravenous contrast. CONTRAST:  80mL OMNIPAQUE IOHEXOL 350 MG/ML SOLN  COMPARISON:  None. FINDINGS: CTA CHEST FINDINGS Cardiovascular: Heart size normal. No pericardial effusion. Satisfactory opacification of pulmonary arteries noted, and there is no evidence of pulmonary emboli. Scattered coronary calcifications. Adequate contrast opacification of the thoracic aorta with no evidence of dissection, aneurysm, or stenosis. There is classic 3-vessel brachiocephalic arch anatomy without proximal stenosis. Moderate partially calcified plaque in the arch and descending thoracic segment. Mediastinum/Nodes: No mass or adenopathy. Lungs/Pleura: No significant pleural effusion. No pneumothorax. Focal infiltrate in the left upper lobe near the hilum. Subsegmental dependent atelectasis in the left lower lobe. Musculoskeletal: Anterior vertebral endplate spurring at multiple levels in the lower thoracic spine. Review of the MIP images confirms the above findings. CT ABDOMEN and PELVIS FINDINGS Hepatobiliary: No focal liver abnormality is seen. No gallstones, gallbladder wall thickening, or biliary dilatation. Pancreas: Unremarkable. No pancreatic ductal dilatation or surrounding inflammatory changes. Spleen: Normal in size without focal abnormality. Adrenals/Urinary Tract: Adrenal glands are unremarkable. Kidneys are normal, without renal calculi, focal lesion, or hydronephrosis. Bladder is distended. Stomach/Bowel: Stomach decompressed. Small bowel is nondilated. Appendix not discretely identified. Moderate proximal colonic fecal material. There is mild gaseous distention of the transverse colon and a portion of the sigmoid segment. No significant diverticular disease nor evidence of obstruction. Vascular/Lymphatic: Moderate calcified aortoiliac plaque without aneurysm. Portal vein patent. No abdominal or pelvic adenopathy. Reproductive: Status post hysterectomy. No adnexal masses. Other: No ascites.  No free air. Musculoskeletal: No acute or significant osseous findings. Review of the MIP images  confirms the above findings. IMPRESSION: 1. Negative for acute PE or thoracic aortic dissection. 2. Left upper lobe infiltrate possibly pneumonia. Followup PA and lateral chest X-ray is recommended in 3-4 weeks following trial of antibiotic therapy to ensure resolution and exclude underlying malignancy. 3. Mild colonic distension with proximal fecal material, no evidence of obstruction. Electronically Signed   By: Corlis Leak  Hassell M.D.   On: 01/18/2021 14:42    ____________________________________________   PROCEDURES  Procedure(s) performed (including Critical Care):  .Critical Care  Date/Time: 01/23/2021 4:45 PM Performed by: Gilles ChiquitoSmith, Tovia Kisner P, MD Authorized by: Gilles ChiquitoSmith, Danayah Smyre P, MD   Critical care provider statement:    Critical care time (minutes):  45   Critical care was necessary to treat or prevent imminent or life-threatening deterioration of the following conditions:  Cardiac failure and respiratory failure   Critical care was time spent personally by me on the following activities:  Discussions with consultants, evaluation of patient's response to treatment, examination of patient, ordering and performing treatments and interventions, ordering and review of laboratory studies, ordering and review of radiographic studies, pulse oximetry, re-evaluation of patient's condition, obtaining history from patient or surrogate and review of old charts   ____________________________________________   INITIAL IMPRESSION / ASSESSMENT AND PLAN / ED COURSE      Patient presents with above-stated history exam for assessment of multiple complaints including several weeks of cough, little week of diarrhea described as nonbloody and a couple days of worsening pain causing decreased strength and range of motion in the right shoulder and right upper back as well as radiating down to the right lower back and right hip.  Patient is little bit weak in the right shoulder has some tenderness in the posterior  aspect of the shoulder hip and right upper back otherwise has sensation intact light touch throughout the extremities and symmetric grip and lower extremity strength.  She does have decreased cap refill is  little mottled in her digits.  With regard to her cough back pain and shoulder pain differential includes pneumonia, pneumothorax, PE, pleurisy, pericarditis possible referred pain from the abdomen.  She adamantly denies any trauma and there is no large effusion or overlying warmth or edema to suggest a septic joint in the shoulder.  Unclear if this is related to pain described rating down the right lower back although differential for this also includes pyelonephritis, kidney stone, UTI, cholecystitis, diverticulitis and pancreatitis is again patient denies any recent trauma and there is no obvious findings of trauma on exam.  Patient was reportedly mildly hypoxic at 91% on room air in triage but on my assessment has an SPO2 of 94% on room air.  She is also initially noted to be hypertensive with BP of 195/109 although once back to treatment room her BP is 152/74.  However on my assessment her SPO2 is 99% which improved back to the mid 90s on 2 L.  Plain film of the right shoulder is unremarkable for fracture dislocation.  CT a chest shows no evidence of PE or dissection but does show evidence of pneumonia in the left upper lobe.  No evidence of edema or other clear acute thoracic process.  CT abdomen pelvis shows some colonic distention and proximal fecal material but no evidence of obstruction, diverticulitis, cholecystitis, pancreatitis, kidney stone, pyelonephritis or other acute abdominopelvic pathology.  CBC remarkable for leukocytosis with WBC count of 15.  Hemoglobin is 15.9 although patient is known to be thrombocytopenic at 53.  Unclear chronicity as patient has not had lab trend in over 2 years when they were noted to be normal.  UA has some RBCs, rare bacteria ketones and protein but no other  clearance of infection.  COVID and influenza PCR is negative.  Initial lactic acid is slightly elevated 2.2.  BMP remarkable for K of 2.9 and a sodium of 130 as well as a bicarb of 18.  No other significant electrolyte or metabolic derangements.  Hepatic function panel shows no evidence of hepatitis.  T bili is 1.9 although direct bilirubin is 0.4 and overall no focal tenderness in the right upper quadrant or other findings on CT to suggest a cholestatic or acute cholecystitis.  Magnesium is WNL.  Procalcitonin is elevated 1.74.  Troponin is elevated at 67 and given findings on ECG and concerned about NSTEMI.  Patient was given ASA heparin deferred given thrombocytopenia.  Patient was hydrated with IV fluids and blood and urine cultures were obtained as well as broad-spectrum biotics administered to cover for commune acquired pneumonia.  Undergoing initial work-up patient also stated she had been having some blurry vision with some black spots over the last 5 days or so.  She has not mentioned this at all on the initial history.  On exam her visual acuity is fairly poor 20/200 and left and 20/100 in the right.  Cranial nerves II through XII are grossly intact.  Pupils are symmetric at 2 mm and reactive bilaterally.  There is no corneal abrasions or abnormal fluorescein uptake seen on exam.  Patient unable to sit up to 90 degrees and tolerate IOP testing.  I discussed patient's symptoms and decreased acuity seen on exam with on-call pathologist Dr. Brooke Dare who did not recommend any additional acute diagnostic studies intervention sensitivity we will see the patient tomorrow in the hospital.  Patient admitted to medicine service for further evaluation and management.         ____________________________________________  FINAL CLINICAL IMPRESSION(S) / ED DIAGNOSES  Final diagnoses:  Thrombocytopenia (HCC)  Community acquired pneumonia, unspecified laterality  Dehydration  NSTEMI (non-ST elevated  myocardial infarction) (HCC)  Acute respiratory failure with hypoxia (HCC)  Blurry vision  Hypokalemia    Medications  lidocaine (LIDODERM) 5 % 1 patch (1 patch Transdermal Patch Applied February 22, 2021 1541)  azithromycin (ZITHROMAX) 500 mg in sodium chloride 0.9 % 250 mL IVPB (500 mg Intravenous New Bag/Given 02/22/2021 1648)  lactated ringers bolus 500 mL (has no administration in time range)  acetaminophen (TYLENOL) tablet 650 mg (has no administration in time range)  nicotine (NICODERM CQ - dosed in mg/24 hours) patch 21 mg (has no administration in time range)  aspirin EC tablet 81 mg (has no administration in time range)  0.9 %  sodium chloride infusion (has no administration in time range)  dextromethorphan-guaiFENesin (MUCINEX DM) 30-600 MG per 12 hr tablet 1 tablet (has no administration in time range)  potassium chloride 10 mEq in 100 mL IVPB (has no administration in time range)  potassium chloride SA (KLOR-CON) CR tablet 40 mEq (has no administration in time range)  albuterol (PROVENTIL) (2.5 MG/3ML) 0.083% nebulizer solution 2.5 mg (has no administration in time range)  magnesium sulfate IVPB 2 g 50 mL (has no administration in time range)  ondansetron (ZOFRAN) injection 4 mg (has no administration in time range)  hydrALAZINE (APRESOLINE) injection 5 mg (has no administration in time range)  atorvastatin (LIPITOR) tablet 40 mg (has no administration in time range)  fentaNYL (SUBLIMAZE) injection 50 mcg (50 mcg Intravenous Given 02/22/2021 1345)  iohexol (OMNIPAQUE) 350 MG/ML injection 80 mL (80 mLs Intravenous Contrast Given 02/22/21 1357)  lactated ringers bolus 1,000 mL (1,000 mLs Intravenous New Bag/Given 02/22/21 1449)  acetaminophen (TYLENOL) tablet 1,000 mg (1,000 mg Oral Given February 22, 2021 1537)  cefTRIAXone (ROCEPHIN) 1 g in sodium chloride 0.9 % 100 mL IVPB (0 g Intravenous Stopped 2021-02-22 1637)  aspirin chewable tablet 324 mg (324 mg Oral Given 02-22-2021 1537)  tetracaine (PONTOCAINE)  0.5 % ophthalmic solution 2 drop (2 drops Both Eyes Given February 22, 2021 1606)  fluorescein ophthalmic strip 1 strip (1 strip Both Eyes Given 22-Feb-2021 1606)     ED Discharge Orders     None        Note:  This document was prepared using Dragon voice recognition software and may include unintentional dictation errors.    Gilles Chiquito, MD 02-22-21 [phone]

## 2021-02-05 DIAGNOSIS — B951 Streptococcus, group B, as the cause of diseases classified elsewhere: Secondary | ICD-10-CM | POA: Diagnosis not present

## 2021-02-05 DIAGNOSIS — E785 Hyperlipidemia, unspecified: Secondary | ICD-10-CM

## 2021-02-05 DIAGNOSIS — J189 Pneumonia, unspecified organism: Secondary | ICD-10-CM

## 2021-02-05 DIAGNOSIS — F10231 Alcohol dependence with withdrawal delirium: Secondary | ICD-10-CM

## 2021-02-05 DIAGNOSIS — E871 Hypo-osmolality and hyponatremia: Secondary | ICD-10-CM | POA: Diagnosis not present

## 2021-02-05 DIAGNOSIS — A419 Sepsis, unspecified organism: Secondary | ICD-10-CM

## 2021-02-05 DIAGNOSIS — H43399 Other vitreous opacities, unspecified eye: Secondary | ICD-10-CM

## 2021-02-05 DIAGNOSIS — G9341 Metabolic encephalopathy: Secondary | ICD-10-CM

## 2021-02-05 DIAGNOSIS — F10931 Alcohol use, unspecified with withdrawal delirium: Secondary | ICD-10-CM

## 2021-02-05 DIAGNOSIS — R7881 Bacteremia: Secondary | ICD-10-CM

## 2021-02-05 DIAGNOSIS — R6520 Severe sepsis without septic shock: Secondary | ICD-10-CM

## 2021-02-05 DIAGNOSIS — D696 Thrombocytopenia, unspecified: Secondary | ICD-10-CM | POA: Diagnosis not present

## 2021-02-05 LAB — CBC
HCT: 36.5 % (ref 36.0–46.0)
HCT: 34.3 % — ABNORMAL LOW (ref 36.0–46.0)
Hemoglobin: 13.6 g/dL (ref 12.0–15.0)
Hemoglobin: 12.7 g/dL (ref 12.0–15.0)
MCH: 32.5 pg (ref 26.0–34.0)
MCH: 32.2 pg (ref 26.0–34.0)
MCHC: 37.3 g/dL — ABNORMAL HIGH (ref 30.0–36.0)
MCHC: 37 g/dL — ABNORMAL HIGH (ref 30.0–36.0)
MCV: 87.1 fL (ref 80.0–100.0)
MCV: 86.8 fL (ref 80.0–100.0)
Platelets: 49 10*3/uL — ABNORMAL LOW (ref 150–400)
Platelets: 51 10*3/uL — ABNORMAL LOW (ref 150–400)
RBC: 4.19 MIL/uL (ref 3.87–5.11)
RBC: 3.95 MIL/uL (ref 3.87–5.11)
RDW: 13.1 % (ref 11.5–15.5)
RDW: 12.9 % (ref 11.5–15.5)
WBC: 20.3 10*3/uL — ABNORMAL HIGH (ref 4.0–10.5)
WBC: 15.9 10*3/uL — ABNORMAL HIGH (ref 4.0–10.5)
nRBC: 0 % (ref 0.0–0.2)
nRBC: 0 % (ref 0.0–0.2)

## 2021-02-05 LAB — BLOOD CULTURE ID PANEL (REFLEXED) - BCID2

## 2021-02-05 LAB — BASIC METABOLIC PANEL
Anion gap: 15 (ref 5–15)
BUN: 20 mg/dL (ref 8–23)
CO2: 18 mmol/L — ABNORMAL LOW (ref 22–32)
Calcium: 8.6 mg/dL — ABNORMAL LOW (ref 8.9–10.3)
Chloride: 99 mmol/L (ref 98–111)
Creatinine, Ser: 0.54 mg/dL (ref 0.44–1.00)
GFR, Estimated: >60 mL/min (ref >60)
Glucose, Bld: 73 mg/dL (ref 70–99)
Potassium: 3.8 mmol/L (ref 3.5–5.1)
Sodium: 132 mmol/L — ABNORMAL LOW (ref 135–145)

## 2021-02-05 LAB — COMPREHENSIVE METABOLIC PANEL
ALT: 12 U/L (ref 0–44)
AST: 36 U/L (ref 15–41)
Albumin: 2.1 g/dL — ABNORMAL LOW (ref 3.5–5.0)
Alkaline Phosphatase: 76 U/L (ref 38–126)
Anion gap: 11 (ref 5–15)
BUN: 21 mg/dL (ref 8–23)
CO2: 18 mmol/L — ABNORMAL LOW (ref 22–32)
Calcium: 8.5 mg/dL — ABNORMAL LOW (ref 8.9–10.3)
Chloride: 104 mmol/L (ref 98–111)
Creatinine, Ser: 0.56 mg/dL (ref 0.44–1.00)
GFR, Estimated: >60 mL/min (ref >60)
Glucose, Bld: 104 mg/dL — ABNORMAL HIGH (ref 70–99)
Potassium: 3.4 mmol/L — ABNORMAL LOW (ref 3.5–5.1)
Sodium: 133 mmol/L — ABNORMAL LOW (ref 135–145)
Total Bilirubin: 1.5 mg/dL — ABNORMAL HIGH (ref 0.3–1.2)
Total Protein: 5 g/dL — ABNORMAL LOW (ref 6.5–8.1)

## 2021-02-05 LAB — LIPID PANEL
Cholesterol: 68 mg/dL (ref 0–200)
HDL: 19 mg/dL — ABNORMAL LOW (ref >40)
LDL Cholesterol: 31 mg/dL (ref 0–99)
Total CHOL/HDL Ratio: 3.6 RATIO
Triglycerides: 89 mg/dL (ref <150)
VLDL: 18 mg/dL (ref 0–40)

## 2021-02-05 LAB — EXPECTORATED SPUTUM ASSESSMENT W GRAM STAIN, RFLX TO RESP C

## 2021-02-05 LAB — HEMOGLOBIN A1C
Hgb A1c MFr Bld: 5.7 % — ABNORMAL HIGH (ref 4.8–5.6)
Mean Plasma Glucose: 116.89 mg/dL

## 2021-02-05 LAB — MAGNESIUM
Magnesium: 2.2 mg/dL (ref 1.7–2.4)
Magnesium: 1.9 mg/dL (ref 1.7–2.4)

## 2021-02-05 LAB — STREP PNEUMONIAE URINARY ANTIGEN: Strep Pneumo Urinary Antigen: NEGATIVE

## 2021-02-05 LAB — PHOSPHORUS: Phosphorus: 3.4 mg/dL (ref 2.5–4.6)

## 2021-02-05 LAB — URIC ACID: Uric Acid, Serum: 3.8 mg/dL (ref 2.5–7.1)

## 2021-02-05 LAB — PATHOLOGIST SMEAR REVIEW

## 2021-02-05 MED ORDER — DICLOFENAC SODIUM 75 MG PO TBEC
75.0000 mg | DELAYED_RELEASE_TABLET | Freq: Every day | ORAL | Status: DC
  Administered 2021-02-06 – 2021-02-08 (×3): 75 mg via ORAL
  Filled 2021-02-05 (×4): qty 1

## 2021-02-05 MED ORDER — CYCLOBENZAPRINE HCL 5 MG PO TABS
5.0000 mg | ORAL_TABLET | Freq: Every day | ORAL | Status: DC
  Administered 2021-02-05 – 2021-02-08 (×4): 5 mg via ORAL
  Filled 2021-02-05 (×4): qty 1

## 2021-02-05 MED ORDER — COLCHICINE 0.6 MG PO TABS
0.6000 mg | ORAL_TABLET | Freq: Once | ORAL | Status: DC
  Filled 2021-02-05: qty 1

## 2021-02-05 MED ORDER — HALOPERIDOL LACTATE 5 MG/ML IJ SOLN
1.0000 mg | Freq: Four times a day (QID) | INTRAMUSCULAR | Status: DC | PRN
  Administered 2021-02-08 – 2021-02-09 (×2): 1 mg via INTRAVENOUS
  Filled 2021-02-05 (×2): qty 1

## 2021-02-05 MED ORDER — POTASSIUM CHLORIDE 10 MEQ/100ML IV SOLN
10.0000 meq | INTRAVENOUS | Status: AC
  Administered 2021-02-05 (×2): 10 meq via INTRAVENOUS
  Filled 2021-02-05 (×2): qty 100

## 2021-02-05 MED ORDER — LORAZEPAM 1 MG PO TABS: 1.0000 mg | ORAL_TABLET | ORAL | Status: AC | PRN

## 2021-02-05 MED ORDER — THIAMINE HCL 100 MG/ML IJ SOLN
500.0000 mg | Freq: Three times a day (TID) | INTRAMUSCULAR | Status: AC
  Administered 2021-02-05 – 2021-02-07 (×6): 500 mg via INTRAVENOUS
  Filled 2021-02-05 (×6): qty 5

## 2021-02-05 MED ORDER — POTASSIUM CHLORIDE IN NACL 20-0.9 MEQ/L-% IV SOLN
INTRAVENOUS | Status: DC
  Filled 2021-02-05 (×5): qty 1000

## 2021-02-05 MED ORDER — LORAZEPAM 2 MG/ML IJ SOLN
1.0000 mg | INTRAMUSCULAR | Status: AC | PRN
  Administered 2021-02-06: 1 mg via INTRAVENOUS
  Administered 2021-02-07 (×2): 2 mg via INTRAVENOUS
  Filled 2021-02-05 (×4): qty 1

## 2021-02-05 MED ORDER — SODIUM CHLORIDE 0.9 % IV SOLN
2.0000 g | INTRAVENOUS | Status: DC
  Administered 2021-02-05: 16:00:00 2 g via INTRAVENOUS
  Filled 2021-02-05: qty 20
  Filled 2021-02-05: qty 2

## 2021-02-05 NOTE — Progress Notes (Signed)
Patient is refusing at this time to take any medications by mouth.

## 2021-02-05 NOTE — Consult Note (Signed)
NAME: Amy Becker  DOB: 05/18/1951  MRN: [id number]  Date/Time: 02/05/2021 2:23 PM  REQUESTING PROVIDER: Dr.Wieting Subjective:  REASON FOR CONSULT: bacteremia ?No History available from patient as she is sedated with ativan. Son at bed side and I spoke to him Amy Becker is a 70 y.o. female with a history of HTN, excess alcohol use presents from home with not feeling well for the past few days- Her husband had been admitted to [facility] 2 weeks ago and patient started drinking heavily- 1 bottle of wine a day. Her son noted that she was very weak, having severe diarrhea, nausea and pain in her joints. pt is a smoker and has cough. Family called EMS and she was brought to [facility] on 01/20/2021 Vitals in the ED BP 195/109, temp 97.8, HR 79, RR 20, stas 91% Labs revealed wbc 20 ( was 15 on admission) HB 15.9, PLT 53. Cr 0.66, NA 130 and K 2.9. LFT AST 34 and ALT 11 CTA chest no pE- small left side infiltrate CT abdomen no acute findings Blood cultures sent and she was started on Ceftriaxone and azithromycin I am asked to see the patient as blood culture positive for streptococcus Pt llves with her husband and son Has 3 dogs Scratch marks  No travel No raw seafood consumption No hardware No recent antibiotics No steroids  Past Medical History:  Diagnosis Date   Alcohol abuse    Hypertension    Hyponatremia 01/26/2019    Past Surgical History:  Procedure Laterality Date   ABDOMINAL HYSTERECTOMY      Social History   Socioeconomic History   Marital status: Married    Spouse name: Not on file   Number of children: Not on file   Years of education: Not on file   Highest education level: Not on file  Occupational History   Not on file  Tobacco Use   Smoking status: Every Day    Packs/day: 1.00    Years: 40.00    Pack years: 40.00    Types: Cigarettes   Smokeless tobacco: Never  Vaping Use   Vaping Use: Never used  Substance and Sexual Activity   Alcohol use: Yes   Drug use:  Never   Sexual activity: Not on file  Other Topics Concern   Not on file  Social History Narrative   Not on file   Social Determinants of Health   Financial Resource Strain: Not on file  Food Insecurity: Not on file  Transportation Needs: Not on file  Physical Activity: Not on file  Stress: Not on file  Social Connections: Not on file  Intimate Partner Violence: Not on file    Family History  Problem Relation Age of Onset   CAD Mother    Allergies  Allergen Reactions   Hydromorphone Hives   Cefaclor Hives   Tetracycline Rash   I? Current Facility-Administered Medications  Medication Dose Route Frequency Provider Last Rate Last Admin   0.9 % NaCl with KCl 20 mEq/ L  infusion   Intravenous Continuous Wieting, Richard, MD       acetaminophen (TYLENOL) tablet 650 mg  650 mg Oral Q6H PRN Lorretta HarpNiu, Xilin, MD       albuterol (PROVENTIL) (2.5 MG/3ML) 0.083% nebulizer solution 2.5 mg  2.5 mg Nebulization Q4H PRN Lorretta HarpNiu, Xilin, MD       aspirin EC tablet 81 mg  81 mg Oral Daily Lorretta HarpNiu, Xilin, MD   81 mg at 02/05/21 0925   atorvastatin (LIPITOR)  tablet 20 mg  20 mg Oral Daily Lorretta Harp, MD   20 mg at 02/05/21 6834   azithromycin (ZITHROMAX) 500 mg in sodium chloride 0.9 % 250 mL IVPB  500 mg Intravenous Q24H Lorretta Harp, MD       calcium carbonate (TUMS - dosed in mg elemental calcium) chewable tablet 400 mg of elemental calcium  2 tablet Oral TID PRN Lorretta Harp, MD       cefTRIAXone (ROCEPHIN) 2 g in sodium chloride 0.9 % 100 mL IVPB  2 g Intravenous Q24H Zeigler, Dustin G, RPH       cyclobenzaprine (FLEXERIL) tablet 5 mg  5 mg Oral BID Lorretta Harp, MD   5 mg at 02/05/21 1962   dextromethorphan-guaiFENesin (MUCINEX DM) 30-600 MG per 12 hr tablet 1 tablet  1 tablet Oral BID PRN Lorretta Harp, MD       diclofenac (VOLTAREN) EC tablet 75 mg  75 mg Oral BID WC Lorretta Harp, MD   75 mg at 02/05/21 2297   folic acid (FOLVITE) tablet 1 mg  1 mg Oral Daily Lorretta Harp, MD   1 mg at 02/05/21 9892   gabapentin  (NEURONTIN) tablet 600 mg  600 mg Oral q morning Lorretta Harp, MD   600 mg at 02/05/21 1194   haloperidol lactate (HALDOL) injection 1 mg  1 mg Intravenous Q6H PRN Alford Highland, MD       hydrALAZINE (APRESOLINE) injection 5 mg  5 mg Intravenous Q2H PRN Lorretta Harp, MD   5 mg at 02/05/21 0515   hydrOXYzine (ATARAX/VISTARIL) tablet 25 mg  25 mg Oral TID Lorretta Harp, MD   25 mg at 02/05/21 0926   lidocaine (LIDODERM) 5 % 1 patch  1 patch Transdermal Q24H Lorretta Harp, MD   1 patch at 01/23/2021 1541   lisinopril (ZESTRIL) tablet 40 mg  40 mg Oral Daily Lorretta Harp, MD   40 mg at 02/05/21 0925   LORazepam (ATIVAN) tablet 1-4 mg  1-4 mg Oral Q1H PRN Alford Highland, MD       Or   LORazepam (ATIVAN) injection 1-4 mg  1-4 mg Intravenous Q1H PRN Wieting, Richard, MD       LORazepam (ATIVAN) tablet 1 mg  1 mg Oral Daily PRN Lorretta Harp, MD   1 mg at 02/05/21 1740   metoprolol succinate (TOPROL-XL) 24 hr tablet 25 mg  25 mg Oral Daily Lorretta Harp, MD   25 mg at 02/05/21 0925   mirtazapine (REMERON) tablet 45 mg  45 mg Oral QHS Lorretta Harp, MD   45 mg at 02/07/2021 2156   nicotine (NICODERM CQ - dosed in mg/24 hours) patch 21 mg  21 mg Transdermal Daily Lorretta Harp, MD   21 mg at 02/05/21 0927   ondansetron (ZOFRAN) injection 4 mg  4 mg Intravenous Q8H PRN Lorretta Harp, MD       oxyCODONE-acetaminophen (PERCOCET/ROXICET) 5-325 MG per tablet 1 tablet  1 tablet Oral Q4H PRN Lorretta Harp, MD   1 tablet at 02/05/21 0429   pantoprazole (PROTONIX) EC tablet 20 mg  20 mg Oral Daily Lorretta Harp, MD   20 mg at 02/05/21 8144   sertraline (ZOLOFT) tablet 150 mg  150 mg Oral Daily Lorretta Harp, MD   150 mg at 02/05/21 8185   thiamine 500mg  in normal saline (70ml) IVPB  500 mg Intravenous Q8H Wieting, Richard, MD       traZODone (DESYREL) tablet 100 mg  100 mg Oral QHS 45m, MD  100 mg at 02-12-2021 2154     Abtx:  Anti-infectives (From admission, onward)    Start     Dose/Rate Route Frequency Ordered Stop   02/05/21 1600   cefTRIAXone (ROCEPHIN) 2 g in sodium chloride 0.9 % 100 mL IVPB        2 g 200 mL/hr over 30 Minutes Intravenous Every 24 hours 02/05/21 1010     02/05/21 1500  azithromycin (ZITHROMAX) 500 mg in sodium chloride 0.9 % 250 mL IVPB        500 mg 250 mL/hr over 60 Minutes Intravenous Every 24 hours 2021/02/12 1837     02/05/21 1500  cefTRIAXone (ROCEPHIN) 1 g in sodium chloride 0.9 % 100 mL IVPB  Status:  Discontinued        1 g 200 mL/hr over 30 Minutes Intravenous Every 24 hours 02/12/2021 1837 02/05/21 1010   02/12/21 1545  azithromycin (ZITHROMAX) 500 mg in sodium chloride 0.9 % 250 mL IVPB        500 mg 250 mL/hr over 60 Minutes Intravenous  Once 2021-02-12 1532 02/12/2021 1755   02/12/21 1500  cefTRIAXone (ROCEPHIN) 1 g in sodium chloride 0.9 % 100 mL IVPB        1 g 200 mL/hr over 30 Minutes Intravenous  Once 02/12/2021 1455 12-Feb-2021 1637       REVIEW OF SYSTEMS:  NA Objective:  VITALS:  BP (!) 141/65 (BP Location: Right Arm)   Pulse 78   Temp (!) 97.4 F (36.3 C) (Oral)   Resp 20   Ht 5\' 1"  (1.549 m)   Wt 61.2 kg   SpO2 92%   BMI 25.51 kg/m  PHYSICAL EXAM:  General: sedated with ativan because of agitation Head: Normocephalic, without obvious abnormality, atraumatic. Eyes: Conjunctivae clear, anicteric sclerae. Pupils are equal ENT cannot examine Neck: symmetrical no carotid bruit and no JVD. Back: No CVA tenderness. Lungs: Clear to auscultation bilaterally. No Wheezing or Rhonchi. No rales. Heart: Regular rate and rhythm, no murmur, rub or gallop. Abdomen: Soft, non-tender,not distended. Bowel sounds normal. No masses Extremities: painful rt arm movt Rt thumb swollen and tender  Red color toes, no warmth Skin: easy bruising of the skin over forearms Rt thigh scratch site has some brusing Lymph: Cervical, supraclavicular normal. Neurologic: cannot assess Pertinent Labs Lab Results CBC    Component Value Date/Time   WBC 15.9 (H) 02/05/2021 1143   RBC 3.95  02/05/2021 1143   HGB 12.7 02/05/2021 1143   HCT 34.3 (L) 02/05/2021 1143   PLT 51 (L) 02/05/2021 1143   MCV 86.8 02/05/2021 1143   MCH 32.2 02/05/2021 1143   MCHC 37.0 (H) 02/05/2021 1143   RDW 12.9 02/05/2021 1143   LYMPHSABS 0.9 01/29/2019 0629   MONOABS 0.9 01/29/2019 0629   EOSABS 0.3 01/29/2019 0629   BASOSABS 0.1 01/29/2019 0629    CMP Latest Ref Rng & Units 02/05/2021 02/05/2021 02/12/21  Glucose 70 - 99 mg/dL 02/06/2021) 73 80  BUN 8 - 23 mg/dL 21 20 20   Creatinine 0.44 - 1.00 mg/dL 229(N 9.89  Sodium 135 - 145 mmol/L 133(L) 132(L) 130(L)  Potassium 3.5 - 5.1 mmol/L 3.4(L) 3.8 2.9(L)  Chloride 98 - 111 mmol/L 104 99 97(L)  CO2 22 - 32 mmol/L 18(L) 18(L) 18(L)  Calcium 8.9 - 10.3 mg/dL 2.11) 9.41) 7.4(Y)  Total Protein 6.5 - 8.1 g/dL 5.0(L) - 6.0(L)  Total Bilirubin 0.3 - 1.2 mg/dL 8.1(K) - 1.9(H)  Alkaline Phos 38 - 126 U/L 76 -  101  AST 15 - 41 U/L 36 - 34  ALT 0 - 44 U/L 12 - 11      Microbiology: Recent Results (from the past 240 hour(s))  Resp Panel by RT-PCR (Flu A&B, Covid) Nasopharyngeal Swab     Status: None   Collection Time: 01/24/2021  1:37 PM   Specimen: Nasopharyngeal Swab; Nasopharyngeal(NP) swabs in vial transport medium  Result Value Ref Range Status   SARS Coronavirus 2 by RT PCR NEGATIVE NEGATIVE Final    Comment: (NOTE) SARS-CoV-2 target nucleic acids are NOT DETECTED.  The SARS-CoV-2 RNA is generally detectable in upper respiratory specimens during the acute phase of infection. The lowest concentration of SARS-CoV-2 viral copies this assay can detect is 138 copies/mL. A negative result does not preclude SARS-Cov-2 infection and should not be used as the sole basis for treatment or other patient management decisions. A negative result may occur with  improper specimen collection/handling, submission of specimen other than nasopharyngeal swab, presence of viral mutation(s) within the areas targeted by this assay, and inadequate number of  viral copies(<138 copies/mL). A negative result must be combined with clinical observations, patient history, and epidemiological information. The expected result is Negative.  Fact Sheet for Patients:  BloggerCourse.com  Fact Sheet for Healthcare Providers:  SeriousBroker.it  This test is no t yet approved or cleared by the [place] FDA and  has been authorized for detection and/or diagnosis of SARS-CoV-2 by FDA under an Emergency Use Authorization (EUA). This EUA will remain  in effect (meaning this test can be used) for the duration of the COVID-19 declaration under Section 564(b)(1) of the Act, 21 U.S.C.section 360bbb-3(b)(1), unless the authorization is terminated  or revoked sooner.       Influenza A by PCR NEGATIVE NEGATIVE Final   Influenza B by PCR NEGATIVE NEGATIVE Final    Comment: (NOTE) The Xpert Xpress SARS-CoV-2/FLU/RSV plus assay is intended as an aid in the diagnosis of influenza from Nasopharyngeal swab specimens and should not be used as a sole basis for treatment. Nasal washings and aspirates are unacceptable for Xpert Xpress SARS-CoV-2/FLU/RSV testing.  Fact Sheet for Patients: BloggerCourse.com  Fact Sheet for Healthcare Providers: SeriousBroker.it  This test is not yet approved or cleared by the [place] FDA and has been authorized for detection and/or diagnosis of SARS-CoV-2 by FDA under an Emergency Use Authorization (EUA). This EUA will remain in effect (meaning this test can be used) for the duration of the COVID-19 declaration under Section 564(b)(1) of the Act, 21 U.S.C. section 360bbb-3(b)(1), unless the authorization is terminated or revoked.  Performed at [facility], [address] [place], [place] [address]   Blood culture (single)     Status: None (Preliminary result)   Collection Time: 01/21/2021  3:59 PM   Specimen:  BLOOD  Result Value Ref Range Status   Specimen Description BLOOD LEFT ANTECUBITAL  Final   Special Requests   Final    BOTTLES DRAWN AEROBIC AND ANAEROBIC Blood Culture results may not be optimal due to an inadequate volume of blood received in culture bottles   Culture  Setup Time   Final    Organism ID to follow GRAM POSITIVE COCCI IN BOTH AEROBIC AND ANAEROBIC BOTTLES CRITICAL RESULT CALLED TO, READ BACK BY AND VERIFIED WITH: JASON ROBINS  ON 02/05/21 SKL Performed at [facility], [address] [place], [place] [address]    Culture [facility] POSITIVE COCCI  Final   Report Status PENDING  Incomplete  Blood Culture ID Panel (Reflexed)     Status: Abnormal   Collection Time: 2021-02-07  3:59 PM  Result Value Ref Range Status   Enterococcus faecalis NOT DETECTED NOT DETECTED Final   Enterococcus Faecium NOT DETECTED NOT DETECTED Final   Listeria monocytogenes NOT DETECTED NOT DETECTED Final   Staphylococcus species NOT DETECTED NOT DETECTED Final   Staphylococcus aureus (BCID) NOT DETECTED NOT DETECTED Final   Staphylococcus epidermidis NOT DETECTED NOT DETECTED Final   Staphylococcus lugdunensis NOT DETECTED NOT DETECTED Final   Streptococcus species DETECTED (A) NOT DETECTED Final    Comment: CRITICAL RESULT CALLED TO, READ BACK BY AND VERIFIED WITH: JASON ROBINS @0113  ON 02/05/21 SKL    Streptococcus agalactiae DETECTED (A) NOT DETECTED Final    Comment: CRITICAL RESULT CALLED TO, READ BACK BY AND VERIFIED WITH: JASON ROBINS @0113  ON 02/05/21 SKL    Streptococcus pneumoniae NOT DETECTED NOT DETECTED Final   Streptococcus pyogenes NOT DETECTED NOT DETECTED Final   A.calcoaceticus-baumannii NOT DETECTED NOT DETECTED Final   Bacteroides fragilis NOT DETECTED NOT DETECTED Final   Enterobacterales NOT DETECTED NOT DETECTED Final   Enterobacter cloacae complex NOT DETECTED NOT DETECTED Final   Escherichia coli NOT DETECTED NOT DETECTED Final   Klebsiella aerogenes NOT  DETECTED NOT DETECTED Final   Klebsiella oxytoca NOT DETECTED NOT DETECTED Final   Klebsiella pneumoniae NOT DETECTED NOT DETECTED Final   Proteus species NOT DETECTED NOT DETECTED Final   Salmonella species NOT DETECTED NOT DETECTED Final   Serratia marcescens NOT DETECTED NOT DETECTED Final   Haemophilus influenzae NOT DETECTED NOT DETECTED Final   Neisseria meningitidis NOT DETECTED NOT DETECTED Final   Pseudomonas aeruginosa NOT DETECTED NOT DETECTED Final   Stenotrophomonas maltophilia NOT DETECTED NOT DETECTED Final   Candida albicans NOT DETECTED NOT DETECTED Final   Candida auris NOT DETECTED NOT DETECTED Final   Candida glabrata NOT DETECTED NOT DETECTED Final   Candida krusei NOT DETECTED NOT DETECTED Final   Candida parapsilosis NOT DETECTED NOT DETECTED Final   Candida tropicalis NOT DETECTED NOT DETECTED Final   Cryptococcus neoformans/gattii NOT DETECTED NOT DETECTED Final    Comment: Performed at [facility], [address] [place], [address] [place]    IMAGING RESULTS:  I have personally reviewed the films ? Impression/Recommendation ?Group B streptococcus bacteremia- source could be from the skin - possible thigh or arms because of skin breach No diabetes, no hardware Will evaluate again once she is awake and alert Continue ceftriaxone and azithromycin- May do unasyn if cough worsens  Possible aspiration pneumonitis Vs CAP- left side Sputum culture  Swelling and tenderness rt thumb, shoulder- Thumb has tophi- likely Acute Gout flare up  Alcohol abuse with likely withdrawal and agitation- on ativan  Thrombocytopenia- very likely due to alcohol  Hyponatremia- could be due to the above Hypokalemia- could be due to diarrhea  Diarrhea- no recent antibiotic use- but will check for Cdiff   ? ___________________________________________________ Discussed with son , requesting provider Note:  This document was prepared using Dragon voice  recognition software and may include unintentional dictation errors.

## 2021-02-05 NOTE — Progress Notes (Signed)
Patient ID: Amy Becker, female   DOB: Dec 13, 1950, 70 y.o.   MRN: [id number] [facility] PROGRESS NOTE  MAKENLEE MCKEAG WUJ:[id number] DOB: 09/24/1950 DOA: 01/22/2021 PCP: Orson Aloe, MD  HPI/Subjective: Called by nursing staff this morning because patient was confused.  She did not sleep last night.  Trying to understand what she saying is difficult with her low voice.  No complaints of chest pain or shortness of breath.  States she drank alcohol 2 weeks ago and quit cold [place].  Husband states that it was 5 days ago.  Patient coming in with cough shortness of breath and joint pain  Objective: Vitals:   02/05/21 1152 02/05/21 1530  BP: (!) 141/65 130/65  Pulse: 78 74  Resp: 20 20  Temp: (!) 97.4 F (36.3 C) 98 F (36.7 C)  SpO2: 92% 100%     Filed Weights   02/07/2021 1043  Weight: 61.2 kg    ROS: Review of Systems  Unable to perform ROS: Acuity of condition  Respiratory:  Negative for shortness of breath.   Cardiovascular:  Negative for chest pain.  Gastrointestinal:  Negative for abdominal pain.  Exam: Physical Exam HENT:     Head: Normocephalic.     Mouth/Throat:     Pharynx: No oropharyngeal exudate.  Eyes:     General: Lids are normal.     Conjunctiva/sclera: Conjunctivae normal.  Cardiovascular:     Rate and Rhythm: Normal rate and regular rhythm.     Heart sounds: Normal heart sounds, S1 normal and S2 normal.  Pulmonary:     Breath sounds: Examination of the right-lower field reveals decreased breath sounds. Examination of the left-lower field reveals decreased breath sounds. Decreased breath sounds present. No wheezing, rhonchi or rales.  Abdominal:     Palpations: Abdomen is soft.     Tenderness: There is no abdominal tenderness.  Musculoskeletal:     Right lower leg: No swelling.     Left lower leg: No swelling.  Skin:    General: Skin is warm.     Findings: No rash.     Comments: Bruising on arms  Neurological:     Mental Status: She  is disoriented and confused.     Data Reviewed: Basic Metabolic Panel: Recent Labs  Lab 01/28/2021 1356 01/20/2021 1440 02/08/2021 1759 02/05/21 0436 02/05/21 1143  NA  --  130*  --  132* 133*  K  --  2.9*  --  3.8 3.4*  CL  --  97*  --  99 104  CO2  --  18*  --  18* 18*  GLUCOSE  --  80  --  73 104*  BUN  --  20  --  20 21  CREATININE 0.50 0.66  --  0.54 0.56  CALCIUM  --  8.8*  --  8.6* 8.5*  MG  --  1.7  --  2.2 1.9  PHOS  --   --  3.7  --  3.4   Liver Function Tests: Recent Labs  Lab 02/06/2021 1440 02/05/21 1143  AST 34 36  ALT 11 12  ALKPHOS 101 76  BILITOT 1.9* 1.5*  PROT 6.0* 5.0*  ALBUMIN 2.6* 2.1*   CBC: Recent Labs  Lab 02/03/2021 1045 02/05/21 0436 02/05/21 1143  WBC 15.0* 20.3* 15.9*  HGB 15.9* 13.6 12.7  HCT 44.6 36.5 34.3*  MCV 89.6 87.1 86.8  PLT 53* 49* 51*     Recent Results (from the past 240 hour(s))  Resp Panel by RT-PCR (Flu A&B, Covid) Nasopharyngeal Swab     Status: None   Collection Time: 01/26/2021  1:37 PM   Specimen: Nasopharyngeal Swab; Nasopharyngeal(NP) swabs in vial transport medium  Result Value Ref Range Status   SARS Coronavirus 2 by RT PCR NEGATIVE NEGATIVE Final    Comment: (NOTE) SARS-CoV-2 target nucleic acids are NOT DETECTED.  The SARS-CoV-2 RNA is generally detectable in upper respiratory specimens during the acute phase of infection. The lowest concentration of SARS-CoV-2 viral copies this assay can detect is 138 copies/mL. A negative result does not preclude SARS-Cov-2 infection and should not be used as the sole basis for treatment or other patient management decisions. A negative result may occur with  improper specimen collection/handling, submission of specimen other than nasopharyngeal swab, presence of viral mutation(s) within the areas targeted by this assay, and inadequate number of viral copies(<138 copies/mL). A negative result must be combined with clinical observations, patient history, and  epidemiological information. The expected result is Negative.  Fact Sheet for Patients:  BloggerCourse.com  Fact Sheet for Healthcare Providers:  SeriousBroker.it  This test is no t yet approved or cleared by the [place] FDA and  has been authorized for detection and/or diagnosis of SARS-CoV-2 by FDA under an Emergency Use Authorization (EUA). This EUA will remain  in effect (meaning this test can be used) for the duration of the COVID-19 declaration under Section 564(b)(1) of the Act, 21 U.S.C.section 360bbb-3(b)(1), unless the authorization is terminated  or revoked sooner.       Influenza A by PCR NEGATIVE NEGATIVE Final   Influenza B by PCR NEGATIVE NEGATIVE Final    Comment: (NOTE) The Xpert Xpress SARS-CoV-2/FLU/RSV plus assay is intended as an aid in the diagnosis of influenza from Nasopharyngeal swab specimens and should not be used as a sole basis for treatment. Nasal washings and aspirates are unacceptable for Xpert Xpress SARS-CoV-2/FLU/RSV testing.  Fact Sheet for Patients: BloggerCourse.com  Fact Sheet for Healthcare Providers: SeriousBroker.it  This test is not yet approved or cleared by the [place] FDA and has been authorized for detection and/or diagnosis of SARS-CoV-2 by FDA under an Emergency Use Authorization (EUA). This EUA will remain in effect (meaning this test can be used) for the duration of the COVID-19 declaration under Section 564(b)(1) of the Act, 21 U.S.C. section 360bbb-3(b)(1), unless the authorization is terminated or revoked.  Performed at [facility], [address] [place], [place] [address]   Blood culture (single)     Status: None (Preliminary result)   Collection Time: 02/16/2021  3:59 PM   Specimen: BLOOD  Result Value Ref Range Status   Specimen Description BLOOD LEFT ANTECUBITAL  Final   Special Requests    Final    BOTTLES DRAWN AEROBIC AND ANAEROBIC Blood Culture results may not be optimal due to an inadequate volume of blood received in culture bottles   Culture  Setup Time   Final    Organism ID to follow GRAM POSITIVE COCCI IN BOTH AEROBIC AND ANAEROBIC BOTTLES CRITICAL RESULT CALLED TO, READ BACK BY AND VERIFIED WITH: JASON ROBINS @0113  ON 02/05/21 SKL Performed at [facility], [address] [place], [place]    Culture [facility] POSITIVE COCCI  Final   Report Status PENDING  Incomplete  Blood Culture ID Panel (Reflexed)     Status: Abnormal   Collection Time: 01/29/2021  3:59 PM  Result Value Ref Range Status   Enterococcus faecalis NOT DETECTED NOT DETECTED  Final   Enterococcus Faecium NOT DETECTED NOT DETECTED Final   Listeria monocytogenes NOT DETECTED NOT DETECTED Final   Staphylococcus species NOT DETECTED NOT DETECTED Final   Staphylococcus aureus (BCID) NOT DETECTED NOT DETECTED Final   Staphylococcus epidermidis NOT DETECTED NOT DETECTED Final   Staphylococcus lugdunensis NOT DETECTED NOT DETECTED Final   Streptococcus species DETECTED (A) NOT DETECTED Final    Comment: CRITICAL RESULT CALLED TO, READ BACK BY AND VERIFIED WITH: JASON ROBINS @0113  ON 02/05/21 SKL    Streptococcus agalactiae DETECTED (A) NOT DETECTED Final    Comment: CRITICAL RESULT CALLED TO, READ BACK BY AND VERIFIED WITH: JASON ROBINS @0113  ON 02/05/21 SKL    Streptococcus pneumoniae NOT DETECTED NOT DETECTED Final   Streptococcus pyogenes NOT DETECTED NOT DETECTED Final   A.calcoaceticus-baumannii NOT DETECTED NOT DETECTED Final   Bacteroides fragilis NOT DETECTED NOT DETECTED Final   Enterobacterales NOT DETECTED NOT DETECTED Final   Enterobacter cloacae complex NOT DETECTED NOT DETECTED Final   Escherichia coli NOT DETECTED NOT DETECTED Final   Klebsiella aerogenes NOT DETECTED NOT DETECTED Final   Klebsiella oxytoca NOT DETECTED NOT DETECTED Final   Klebsiella pneumoniae NOT  DETECTED NOT DETECTED Final   Proteus species NOT DETECTED NOT DETECTED Final   Salmonella species NOT DETECTED NOT DETECTED Final   Serratia marcescens NOT DETECTED NOT DETECTED Final   Haemophilus influenzae NOT DETECTED NOT DETECTED Final   Neisseria meningitidis NOT DETECTED NOT DETECTED Final   Pseudomonas aeruginosa NOT DETECTED NOT DETECTED Final   Stenotrophomonas maltophilia NOT DETECTED NOT DETECTED Final   Candida albicans NOT DETECTED NOT DETECTED Final   Candida auris NOT DETECTED NOT DETECTED Final   Candida glabrata NOT DETECTED NOT DETECTED Final   Candida krusei NOT DETECTED NOT DETECTED Final   Candida parapsilosis NOT DETECTED NOT DETECTED Final   Candida tropicalis NOT DETECTED NOT DETECTED Final   Cryptococcus neoformans/gattii NOT DETECTED NOT DETECTED Final    Comment: Performed at [facility], [address], [place], [address] [place]     Studies: DG Shoulder Right  Result Date: 01/24/2021 CLINICAL DATA:  Right shoulder pain. EXAM: RIGHT SHOULDER - 2+ VIEW COMPARISON:  None. FINDINGS: Right shoulder is located without a fracture. Visualized right ribs are intact. Normal alignment at the right [facility] joint. IMPRESSION: No acute abnormality to the right shoulder. Electronically Signed   By: 02/06/2021 M.D.   On: 01/28/2021 15:48   CT Angio Chest PE W and/or Wo Contrast  Result Date: 01/25/2021 CLINICAL DATA:  Nausea, diarrhea, high prob PE suspected EXAM: CT ANGIOGRAPHY CHEST CT ABDOMEN AND PELVIS WITH CONTRAST TECHNIQUE: Multidetector CT imaging of the chest was performed using the standard protocol during bolus administration of intravenous contrast. Multiplanar CT image reconstructions and MIPs were obtained to evaluate the vascular anatomy. Multidetector CT imaging of the abdomen and pelvis was performed using the standard protocol during bolus administration of intravenous contrast. CONTRAST:  51mL OMNIPAQUE IOHEXOL 350 MG/ML SOLN COMPARISON:  None.  FINDINGS: CTA CHEST FINDINGS Cardiovascular: Heart size normal. No pericardial effusion. Satisfactory opacification of pulmonary arteries noted, and there is no evidence of pulmonary emboli. Scattered coronary calcifications. Adequate contrast opacification of the thoracic aorta with no evidence of dissection, aneurysm, or stenosis. There is classic 3-vessel brachiocephalic arch anatomy without proximal stenosis. Moderate partially calcified plaque in the arch and descending thoracic segment. Mediastinum/Nodes: No mass or adenopathy. Lungs/Pleura: No significant pleural effusion. No pneumothorax. Focal infiltrate in the left upper lobe near the hilum.  Subsegmental dependent atelectasis in the left lower lobe. Musculoskeletal: Anterior vertebral endplate spurring at multiple levels in the lower thoracic spine. Review of the MIP images confirms the above findings. CT ABDOMEN and PELVIS FINDINGS Hepatobiliary: No focal liver abnormality is seen. No gallstones, gallbladder wall thickening, or biliary dilatation. Pancreas: Unremarkable. No pancreatic ductal dilatation or surrounding inflammatory changes. Spleen: Normal in size without focal abnormality. Adrenals/Urinary Tract: Adrenal glands are unremarkable. Kidneys are normal, without renal calculi, focal lesion, or hydronephrosis. Bladder is distended. Stomach/Bowel: Stomach decompressed. Small bowel is nondilated. Appendix not discretely identified. Moderate proximal colonic fecal material. There is mild gaseous distention of the transverse colon and a portion of the sigmoid segment. No significant diverticular disease nor evidence of obstruction. Vascular/Lymphatic: Moderate calcified aortoiliac plaque without aneurysm. Portal vein patent. No abdominal or pelvic adenopathy. Reproductive: Status post hysterectomy. No adnexal masses. Other: No ascites.  No free air. Musculoskeletal: No acute or significant osseous findings. Review of the MIP images confirms the above  findings. IMPRESSION: 1. Negative for acute PE or thoracic aortic dissection. 2. Left upper lobe infiltrate possibly pneumonia. Followup PA and lateral chest X-ray is recommended in 3-4 weeks following trial of antibiotic therapy to ensure resolution and exclude underlying malignancy. 3. Mild colonic distension with proximal fecal material, no evidence of obstruction. Electronically Signed   By: Corlis Leak  Hassell M.D.   On: 02/16/2021 14:42   CT ABDOMEN PELVIS W CONTRAST  Result Date: 01/24/2021 CLINICAL DATA:  Nausea, diarrhea, high prob PE suspected EXAM: CT ANGIOGRAPHY CHEST CT ABDOMEN AND PELVIS WITH CONTRAST TECHNIQUE: Multidetector CT imaging of the chest was performed using the standard protocol during bolus administration of intravenous contrast. Multiplanar CT image reconstructions and MIPs were obtained to evaluate the vascular anatomy. Multidetector CT imaging of the abdomen and pelvis was performed using the standard protocol during bolus administration of intravenous contrast. CONTRAST:  80mL OMNIPAQUE IOHEXOL 350 MG/ML SOLN COMPARISON:  None. FINDINGS: CTA CHEST FINDINGS Cardiovascular: Heart size normal. No pericardial effusion. Satisfactory opacification of pulmonary arteries noted, and there is no evidence of pulmonary emboli. Scattered coronary calcifications. Adequate contrast opacification of the thoracic aorta with no evidence of dissection, aneurysm, or stenosis. There is classic 3-vessel brachiocephalic arch anatomy without proximal stenosis. Moderate partially calcified plaque in the arch and descending thoracic segment. Mediastinum/Nodes: No mass or adenopathy. Lungs/Pleura: No significant pleural effusion. No pneumothorax. Focal infiltrate in the left upper lobe near the hilum. Subsegmental dependent atelectasis in the left lower lobe. Musculoskeletal: Anterior vertebral endplate spurring at multiple levels in the lower thoracic spine. Review of the MIP images confirms the above findings. CT  ABDOMEN and PELVIS FINDINGS Hepatobiliary: No focal liver abnormality is seen. No gallstones, gallbladder wall thickening, or biliary dilatation. Pancreas: Unremarkable. No pancreatic ductal dilatation or surrounding inflammatory changes. Spleen: Normal in size without focal abnormality. Adrenals/Urinary Tract: Adrenal glands are unremarkable. Kidneys are normal, without renal calculi, focal lesion, or hydronephrosis. Bladder is distended. Stomach/Bowel: Stomach decompressed. Small bowel is nondilated. Appendix not discretely identified. Moderate proximal colonic fecal material. There is mild gaseous distention of the transverse colon and a portion of the sigmoid segment. No significant diverticular disease nor evidence of obstruction. Vascular/Lymphatic: Moderate calcified aortoiliac plaque without aneurysm. Portal vein patent. No abdominal or pelvic adenopathy. Reproductive: Status post hysterectomy. No adnexal masses. Other: No ascites.  No free air. Musculoskeletal: No acute or significant osseous findings. Review of the MIP images confirms the above findings. IMPRESSION: 1. Negative for acute PE or thoracic aortic dissection. 2.  Left upper lobe infiltrate possibly pneumonia. Followup PA and lateral chest X-ray is recommended in 3-4 weeks following trial of antibiotic therapy to ensure resolution and exclude underlying malignancy. 3. Mild colonic distension with proximal fecal material, no evidence of obstruction. Electronically Signed   By: Corlis Leak M.D.   On: 02-25-21 14:42    Scheduled Meds:  aspirin EC  81 mg Oral Daily   atorvastatin  20 mg Oral Daily   colchicine  0.6 mg Oral Once   cyclobenzaprine  5 mg Oral QHS   [START ON 02/06/2021] diclofenac  75 mg Oral Q breakfast   folic acid  1 mg Oral Daily   gabapentin  600 mg Oral q morning   hydrOXYzine  25 mg Oral TID   lidocaine  1 patch Transdermal Q24H   lisinopril  40 mg Oral Daily   metoprolol succinate  25 mg Oral Daily   mirtazapine   45 mg Oral QHS   nicotine  21 mg Transdermal Daily   pantoprazole  20 mg Oral Daily   sertraline  150 mg Oral Daily   traZODone  100 mg Oral QHS   Continuous Infusions:  0.9 % NaCl with KCl 20 mEq / L 50 mL/hr at 02/05/21 1504   azithromycin (ZITHROMAX) 500 MG IVPB (Vial-Mate [employer]) 500 mg (02/05/21 1511)   cefTRIAXone (ROCEPHIN)  IV 2 g (02/05/21 1609)   thiamine injection 500 mg (02/05/21 1455)    Assessment/Plan:  Severe sepsis, acute metabolic encephalopathy, tachycardia and leukocytosis, present on admission.  Streptococcus agalactiae growing out of blood cultures.  Patient has left upper lobe pneumonia on CT scan.  Currently on Rocephin and Zithromax.  ID consultation.  Lactic acid elevated 2.2 on presentation. Hyponatremia likely secondary to alcohol abuse Hypokalemia replace potassium and IV fluids Alcohol abuse and withdrawal.  We will give high-dose thiamine with acute metabolic encephalopathy.  Put on alcohol withdrawal protocol. Thrombocytopenia.  Platelet count 51,000.  Likely secondary to alcohol abuse. Right shoulder pain.  Add on a uric acid and give 1 dose of colchicine.  CT scan of the chest negative for abnormality in the lung and right shoulder x-ray negative Diarrhea.  Admitting physician ordered stool studies Floaters.  Ophthalmology was consulted from the emergency room. Depression anxiety on Zoloft and trazodone Hyperlipidemia unspecified on Lipitor Elevated troponin secondary to demand ischemia.  On aspirin and Lipitor        Code Status:     Code Status Orders  (From admission, onward)           Start     Ordered   February 25, 2021 1700  Full code  Continuous        02-25-21 1659           Code Status History     Date Active Date Inactive Code Status Order ID Comments User Context   01/26/2019 1900 01/30/2019 1702 Full Code [id number]  Pearson Grippe, MD ED      Family Communication: Spoke with husband on the phone Disposition Plan: Status is:  Inpatient  Dispo: The patient is from: Home              Anticipated d/c is to: Home              Patient currently being treated for sepsis with strep agalactiae and the blood cultures and also has acute metabolic encephalopathy suspected alcohol withdrawal   Difficult to place patient.  No  Consultants: Infectious disease  Antibiotics: Rocephin and Zithromax  Time spent: 28 minutes  Shernita Rabinovich [employer]

## 2021-02-05 NOTE — Progress Notes (Signed)
PHARMACY - PHYSICIAN COMMUNICATION CRITICAL VALUE ALERT - BLOOD CULTURE IDENTIFICATION (BCID)  Amy Becker is an 70 y.o. female who presented to [facility] on 02/13/2021 with a chief complaint of CAP  Assessment:  Strep agalactiae in 1 of 4 bottles, no resistance  (include suspected source if known)  Name of physician (or Provider) Contacted: [place]   Current antibiotics:  Ceftriaxone and azithromycin   Changes to prescribed antibiotics recommended:  Most likely a contaminant, ceftriaxone should cover in any case.  Will continue ceftriaxone and azithromycin   Results for orders placed or performed during the hospital encounter of 01/22/2021  Blood Culture ID Panel (Reflexed) (Collected: 02/07/2021  3:59 PM)  Result Value Ref Range   Enterococcus faecalis NOT DETECTED NOT DETECTED   Enterococcus Faecium NOT DETECTED NOT DETECTED   Listeria monocytogenes NOT DETECTED NOT DETECTED   Staphylococcus species NOT DETECTED NOT DETECTED   Staphylococcus aureus (BCID) NOT DETECTED NOT DETECTED   Staphylococcus epidermidis NOT DETECTED NOT DETECTED   Staphylococcus lugdunensis NOT DETECTED NOT DETECTED   Streptococcus species DETECTED (A) NOT DETECTED   Streptococcus agalactiae DETECTED (A) NOT DETECTED   Streptococcus pneumoniae NOT DETECTED NOT DETECTED   Streptococcus pyogenes NOT DETECTED NOT DETECTED   A.calcoaceticus-baumannii NOT DETECTED NOT DETECTED   Bacteroides fragilis NOT DETECTED NOT DETECTED   Enterobacterales NOT DETECTED NOT DETECTED   Enterobacter cloacae complex NOT DETECTED NOT DETECTED   Escherichia coli NOT DETECTED NOT DETECTED   Klebsiella aerogenes NOT DETECTED NOT DETECTED   Klebsiella oxytoca NOT DETECTED NOT DETECTED   Klebsiella pneumoniae NOT DETECTED NOT DETECTED   Proteus species NOT DETECTED NOT DETECTED   Salmonella species NOT DETECTED NOT DETECTED   Serratia marcescens NOT DETECTED NOT DETECTED   Haemophilus influenzae NOT DETECTED NOT DETECTED    Neisseria meningitidis NOT DETECTED NOT DETECTED   Pseudomonas aeruginosa NOT DETECTED NOT DETECTED   Stenotrophomonas maltophilia NOT DETECTED NOT DETECTED   Candida albicans NOT DETECTED NOT DETECTED   Candida auris NOT DETECTED NOT DETECTED   Candida glabrata NOT DETECTED NOT DETECTED   Candida krusei NOT DETECTED NOT DETECTED   Candida parapsilosis NOT DETECTED NOT DETECTED   Candida tropicalis NOT DETECTED NOT DETECTED   Cryptococcus neoformans/gattii NOT DETECTED NOT DETECTED    Criston Chancellor D 02/05/2021  1:24 AM

## 2021-02-06 DIAGNOSIS — J189 Pneumonia, unspecified organism: Secondary | ICD-10-CM | POA: Diagnosis not present

## 2021-02-06 DIAGNOSIS — R7881 Bacteremia: Secondary | ICD-10-CM | POA: Diagnosis not present

## 2021-02-06 DIAGNOSIS — D696 Thrombocytopenia, unspecified: Secondary | ICD-10-CM | POA: Diagnosis not present

## 2021-02-06 DIAGNOSIS — A419 Sepsis, unspecified organism: Secondary | ICD-10-CM | POA: Diagnosis not present

## 2021-02-06 DIAGNOSIS — G9341 Metabolic encephalopathy: Secondary | ICD-10-CM | POA: Diagnosis not present

## 2021-02-06 DIAGNOSIS — B951 Streptococcus, group B, as the cause of diseases classified elsewhere: Secondary | ICD-10-CM | POA: Diagnosis not present

## 2021-02-06 LAB — URINE CULTURE: Culture: 50000 — AB

## 2021-02-06 LAB — LEGIONELLA PNEUMOPHILA SEROGP 1 UR AG: L. pneumophila Serogp 1 Ur Ag: NEGATIVE

## 2021-02-06 MED ORDER — COLCHICINE 0.6 MG PO TABS
0.6000 mg | ORAL_TABLET | Freq: Every day | ORAL | Status: DC
  Administered 2021-02-06 – 2021-02-07 (×2): 0.6 mg via ORAL
  Filled 2021-02-06 (×2): qty 1

## 2021-02-06 MED ORDER — CEFAZOLIN SODIUM-DEXTROSE 2-4 GM/100ML-% IV SOLN
2.0000 g | Freq: Three times a day (TID) | INTRAVENOUS | Status: DC
  Administered 2021-02-06 – 2021-02-08 (×5): 2 g via INTRAVENOUS
  Filled 2021-02-06 (×7): qty 100

## 2021-02-06 NOTE — Plan of Care (Signed)
  Problem: Clinical Measurements: Goal: Respiratory complications will improve Outcome: Progressing   Problem: Safety: Goal: Ability to remain free from injury will improve Outcome: Progressing   

## 2021-02-06 NOTE — Progress Notes (Signed)
PT Cancellation Note  Patient Details Name: Amy Becker MRN: [id number] DOB: 1950/10/21   Cancelled Treatment:    Reason Eval/Treat Not Completed: Fatigue/lethargy limiting ability to participate;Patient declined, no reason specified. Chart reviewed, attempted to see pt without success.  Sister was in room and tried to encourage her to participate but she slurringly states she can't do anything today but may be willing to try tomorrow.  Conferred with sister and though she would like to see her get up/move she agrees that waiting until tomorrow is probably best.     Malachi Pro, DPT 02/06/2021, 10:49 AM

## 2021-02-06 NOTE — Progress Notes (Signed)
Doctor Hilton Sinclair at bedside this morning removed nasal cannula 2L for O2 trial. Gave meds and repositioned patient in bed. Came back into room 30 minutes later and assessed oxygen levels, levels registered 77% RA. Applied oxygen at 3L [employer] with humidification, stayed with patient for 15 minutes and assessed oxygen levels increased to 91%. Resting in bed with sister at bedside. Bed in low position and call bell in reach.

## 2021-02-06 NOTE — Progress Notes (Signed)
Patient ID: Amy Becker, female   DOB: 11/14/1950, 70 y.o.   MRN: [id number] [facility] PROGRESS NOTE  Amy Fritzrudy K Nazaryan WUJ:[id number] DOB: 02/24/1951 DOA: 2020/09/24 PCP: Orson Aloelough, Jeffrey David, MD  HPI/Subjective: Patient again did not sleep last night.  Answering questions a little more appropriately today than yesterday.  Able to follow commands and straight leg raise.  This afternoon son called me that he could not wake her up.  Likely since she did not sleep in 2 nights she is sleeping.  Will reassess later.  Admitted with pneumonia and strep agalactiae sepsis.  Objective: Vitals:   02/06/21 1100 02/06/21 1140  BP:  138/63  Pulse:  76  Resp:  20  Temp:  99.5 F (37.5 C)  SpO2: 91% 92%    Intake/Output Summary (Last 24 hours) at 02/06/2021 1319 Last data filed at 02/06/2021 1130 Gross per 24 hour  Intake 1130.41 ml  Output 1850 ml  Net -719.59 ml   Filed Weights   Jun 16, 2021 1043  Weight: 61.2 kg    ROS: Review of Systems  Respiratory:  Negative for shortness of breath.   Cardiovascular:  Negative for chest pain.  Gastrointestinal:  Negative for abdominal pain.  Exam: Physical Exam HENT:     Head: Normocephalic.     Mouth/Throat:     Pharynx: No oropharyngeal exudate.  Eyes:     General: Lids are normal.     Conjunctiva/sclera: Conjunctivae normal.  Cardiovascular:     Rate and Rhythm: Normal rate and regular rhythm.     Heart sounds: Normal heart sounds, S1 normal and S2 normal.  Pulmonary:     Breath sounds: Examination of the right-lower field reveals decreased breath sounds. Examination of the left-lower field reveals decreased breath sounds. Decreased breath sounds present. No wheezing, rhonchi or rales.  Abdominal:     Palpations: Abdomen is soft.     Tenderness: There is no abdominal tenderness.  Musculoskeletal:     Right lower leg: No swelling.     Left lower leg: No swelling.  Skin:    General: Skin is warm.     Comments: Bruising bilateral  arms  Neurological:     Mental Status: She is confused.     Comments: Was able to straight leg raise bilaterally.     Data Reviewed: Basic Metabolic Panel: Recent Labs  Lab Jun 16, 2021 1356 Jun 16, 2021 1440 Jun 16, 2021 1759 02/05/21 0436 02/05/21 1143  NA  --  130*  --  132* 133*  K  --  2.9*  --  3.8 3.4*  CL  --  97*  --  99 104  CO2  --  18*  --  18* 18*  GLUCOSE  --  80  --  73 104*  BUN  --  20  --  20 21  CREATININE 0.50 0.66  --  0.54 0.56  CALCIUM  --  8.8*  --  8.6* 8.5*  MG  --  1.7  --  2.2 1.9  PHOS  --   --  3.7  --  3.4   Liver Function Tests: Recent Labs  Lab Jun 16, 2021 1440 02/05/21 1143  AST 34 36  ALT 11 12  ALKPHOS 101 76  BILITOT 1.9* 1.5*  PROT 6.0* 5.0*  ALBUMIN 2.6* 2.1*   CBC: Recent Labs  Lab Jun 16, 2021 1045 02/05/21 0436 02/05/21 1143  WBC 15.0* 20.3* 15.9*  HGB 15.9* 13.6 12.7  HCT 44.6 36.5 34.3*  MCV 89.6 87.1 86.8  PLT 53* 49* 51*  Recent Results (from the past 240 hour(s))  Urine Culture     Status: Abnormal   Collection Time: 01/22/2021 10:45 AM   Specimen: Urine, Clean Catch  Result Value Ref Range Status   Specimen Description   Final    URINE, CLEAN CATCH Performed at [facility], [address] [place], [place] [address]    Special Requests   Final    NONE Performed at [facility], [address] [place], [place] [address]    Culture (A)  Final    50,000 COLONIES/mL GROUP B STREP(S.AGALACTIAE)ISOLATED TESTING AGAINST S. AGALACTIAE NOT ROUTINELY PERFORMED DUE TO PREDICTABILITY OF AMP/PEN/VAN SUSCEPTIBILITY. Performed at [facility], [address] [place], [place] [address]    Report Status 02/06/2021 FINAL  Final  Resp Panel by RT-PCR (Flu A&B, Covid) Nasopharyngeal Swab     Status: None   Collection Time: 02/13/2021  1:37 PM   Specimen: Nasopharyngeal Swab; Nasopharyngeal(NP) swabs in vial transport medium  Result Value Ref Range Status   SARS Coronavirus 2 by RT PCR NEGATIVE NEGATIVE  Final    Comment: (NOTE) SARS-CoV-2 target nucleic acids are NOT DETECTED.  The SARS-CoV-2 RNA is generally detectable in upper respiratory specimens during the acute phase of infection. The lowest concentration of SARS-CoV-2 viral copies this assay can detect is 138 copies/mL. A negative result does not preclude SARS-Cov-2 infection and should not be used as the sole basis for treatment or other patient management decisions. A negative result may occur with  improper specimen collection/handling, submission of specimen other than nasopharyngeal swab, presence of viral mutation(s) within the areas targeted by this assay, and inadequate number of viral copies(<138 copies/mL). A negative result must be combined with clinical observations, patient history, and epidemiological information. The expected result is Negative.  Fact Sheet for Patients:  BloggerCourse.com  Fact Sheet for Healthcare Providers:  SeriousBroker.it  This test is no t yet approved or cleared by the [place] FDA and  has been authorized for detection and/or diagnosis of SARS-CoV-2 by FDA under an Emergency Use Authorization (EUA). This EUA will remain  in effect (meaning this test can be used) for the duration of the COVID-19 declaration under Section 564(b)(1) of the Act, 21 U.S.C.section 360bbb-3(b)(1), unless the authorization is terminated  or revoked sooner.       Influenza A by PCR NEGATIVE NEGATIVE Final   Influenza B by PCR NEGATIVE NEGATIVE Final    Comment: (NOTE) The Xpert Xpress SARS-CoV-2/FLU/RSV plus assay is intended as an aid in the diagnosis of influenza from Nasopharyngeal swab specimens and should not be used as a sole basis for treatment. Nasal washings and aspirates are unacceptable for Xpert Xpress SARS-CoV-2/FLU/RSV testing.  Fact Sheet for Patients: BloggerCourse.com  Fact Sheet for Healthcare  Providers: SeriousBroker.it  This test is not yet approved or cleared by the [place] FDA and has been authorized for detection and/or diagnosis of SARS-CoV-2 by FDA under an Emergency Use Authorization (EUA). This EUA will remain in effect (meaning this test can be used) for the duration of the COVID-19 declaration under Section 564(b)(1) of the Act, 21 U.S.C. section 360bbb-3(b)(1), unless the authorization is terminated or revoked.  Performed at [facility], [address] [place], [place] [address]   Blood culture (single)     Status: Abnormal (Preliminary result)   Collection Time: 02/01/2021  3:59 PM   Specimen: BLOOD  Result Value Ref Range Status   Specimen Description   Final  BLOOD LEFT ANTECUBITAL Performed at [facility], [address] [place], [place] [address]    Special Requests   Final    BOTTLES DRAWN AEROBIC AND ANAEROBIC Blood Culture results may not be optimal due to an inadequate volume of blood received in culture bottles Performed at [facility], [address], [place], [place] [address]    Culture  Setup Time   Final    GRAM POSITIVE COCCI IN BOTH AEROBIC AND ANAEROBIC BOTTLES CRITICAL RESULT CALLED TO, READ BACK BY AND VERIFIED WITH: JASON ROBINS @0113  ON 02/05/21 SKL    Culture (A)  Final    GROUP B STREP(S.AGALACTIAE)ISOLATED SUSCEPTIBILITIES TO FOLLOW Performed at [facility], [address] [place], [place]    Report Status PENDING  Incomplete  Blood Culture ID Panel (Reflexed)     Status: Abnormal   Collection Time: 01/17/2021  3:59 PM  Result Value Ref Range Status   Enterococcus faecalis NOT DETECTED NOT DETECTED Final   Enterococcus Faecium NOT DETECTED NOT DETECTED Final   Listeria monocytogenes NOT DETECTED NOT DETECTED Final   Staphylococcus species NOT DETECTED NOT DETECTED Final   Staphylococcus aureus (BCID) NOT DETECTED NOT DETECTED Final    Staphylococcus epidermidis NOT DETECTED NOT DETECTED Final   Staphylococcus lugdunensis NOT DETECTED NOT DETECTED Final   Streptococcus species DETECTED (A) NOT DETECTED Final    Comment: CRITICAL RESULT CALLED TO, READ BACK BY AND VERIFIED WITH: JASON ROBINS @0113  ON 02/05/21 SKL    Streptococcus agalactiae DETECTED (A) NOT DETECTED Final    Comment: CRITICAL RESULT CALLED TO, READ BACK BY AND VERIFIED WITH: JASON ROBINS @0113  ON 02/05/21 SKL    Streptococcus pneumoniae NOT DETECTED NOT DETECTED Final   Streptococcus pyogenes NOT DETECTED NOT DETECTED Final   A.calcoaceticus-baumannii NOT DETECTED NOT DETECTED Final   Bacteroides fragilis NOT DETECTED NOT DETECTED Final   Enterobacterales NOT DETECTED NOT DETECTED Final   Enterobacter cloacae complex NOT DETECTED NOT DETECTED Final   Escherichia coli NOT DETECTED NOT DETECTED Final   Klebsiella aerogenes NOT DETECTED NOT DETECTED Final   Klebsiella oxytoca NOT DETECTED NOT DETECTED Final   Klebsiella pneumoniae NOT DETECTED NOT DETECTED Final   Proteus species NOT DETECTED NOT DETECTED Final   Salmonella species NOT DETECTED NOT DETECTED Final   Serratia marcescens NOT DETECTED NOT DETECTED Final   Haemophilus influenzae NOT DETECTED NOT DETECTED Final   Neisseria meningitidis NOT DETECTED NOT DETECTED Final   Pseudomonas aeruginosa NOT DETECTED NOT DETECTED Final   Stenotrophomonas maltophilia NOT DETECTED NOT DETECTED Final   Candida albicans NOT DETECTED NOT DETECTED Final   Candida auris NOT DETECTED NOT DETECTED Final   Candida glabrata NOT DETECTED NOT DETECTED Final   Candida krusei NOT DETECTED NOT DETECTED Final   Candida parapsilosis NOT DETECTED NOT DETECTED Final   Candida tropicalis NOT DETECTED NOT DETECTED Final   Cryptococcus neoformans/gattii NOT DETECTED NOT DETECTED Final    Comment: Performed at [facility], [address] [place], [facility] [address]  Expectorated Sputum Assessment w Gram Stain, Rflx  to Resp Cult     Status: None   Collection Time: 02/03/2021  7:15 PM   Specimen: SPU  Result Value Ref Range Status   Specimen Description SPUTUM  Final   Special Requests NONE  Final   Sputum evaluation   Final    THIS SPECIMEN IS ACCEPTABLE FOR SPUTUM CULTURE Performed at [facility], [address], [place], [facility] [address]  Report Status 02/05/2021 FINAL  Final  Culture, Respiratory w Gram Stain     Status: None (Preliminary result)   Collection Time: 01/22/2021  7:15 PM   Specimen: SPU  Result Value Ref Range Status   Specimen Description   Final    SPUTUM Performed at [facility], [address], [place], [place] [address]    Special Requests   Final    NONE Reflexed from [phone] Performed at [facility], [address] [place], [place] [address]    Gram Stain   Final    FEW WBC PRESENT, PREDOMINANTLY MONONUCLEAR NO ORGANISMS SEEN    Culture   Final    NO GROWTH < 12 HOURS Performed at [facility], [address], [place], [place] [address]    Report Status PENDING  Incomplete  CULTURE, BLOOD (ROUTINE X 2) w Reflex to ID Panel     Status: None (Preliminary result)   Collection Time: 02/06/21  1:00 AM   Specimen: BLOOD RIGHT HAND  Result Value Ref Range Status   Specimen Description BLOOD RIGHT HAND  Final   Special Requests   Final    BOTTLES DRAWN AEROBIC AND ANAEROBIC Blood Culture adequate volume   Culture   Final    NO GROWTH < 12 HOURS Performed at [facility], [address], [place], [place] [address]    Report Status PENDING  Incomplete  CULTURE, BLOOD (ROUTINE X 2) w Reflex to ID Panel     Status: None (Preliminary result)   Collection Time: 02/06/21  1:01 AM   Specimen: BLOOD LEFT HAND  Result Value Ref Range Status   Specimen Description BLOOD LEFT HAND  Final   Special Requests   Final    BOTTLES DRAWN AEROBIC AND ANAEROBIC Blood Culture results may not be optimal due to an inadequate volume of  blood received in culture bottles   Culture   Final    NO GROWTH < 12 HOURS Performed at [facility], [address] [place], [place] [address]    Report Status PENDING  Incomplete     Studies: DG Shoulder Right  Result Date: 01/21/2021 CLINICAL DATA:  Right shoulder pain. EXAM: RIGHT SHOULDER - 2+ VIEW COMPARISON:  None. FINDINGS: Right shoulder is located without a fracture. Visualized right ribs are intact. Normal alignment at the right [facility] joint. IMPRESSION: No acute abnormality to the right shoulder. Electronically Signed   By: Richarda Overlie M.D.   On: 02/01/2021 15:48   CT Angio Chest PE W and/or Wo Contrast  Result Date: 02/06/2021 CLINICAL DATA:  Nausea, diarrhea, high prob PE suspected EXAM: CT ANGIOGRAPHY CHEST CT ABDOMEN AND PELVIS WITH CONTRAST TECHNIQUE: Multidetector CT imaging of the chest was performed using the standard protocol during bolus administration of intravenous contrast. Multiplanar CT image reconstructions and MIPs were obtained to evaluate the vascular anatomy. Multidetector CT imaging of the abdomen and pelvis was performed using the standard protocol during bolus administration of intravenous contrast. CONTRAST:  4mL OMNIPAQUE IOHEXOL 350 MG/ML SOLN COMPARISON:  None. FINDINGS: CTA CHEST FINDINGS Cardiovascular: Heart size normal. No pericardial effusion. Satisfactory opacification of pulmonary arteries noted, and there is no evidence of pulmonary emboli. Scattered coronary calcifications. Adequate contrast opacification of the thoracic aorta with no evidence of dissection, aneurysm, or stenosis. There is classic 3-vessel brachiocephalic arch anatomy without proximal stenosis. Moderate partially calcified plaque in the arch and descending thoracic segment. Mediastinum/Nodes: No mass or adenopathy. Lungs/Pleura: No significant pleural effusion. No  pneumothorax. Focal infiltrate in the left upper lobe near the hilum. Subsegmental dependent atelectasis in the left  lower lobe. Musculoskeletal: Anterior vertebral endplate spurring at multiple levels in the lower thoracic spine. Review of the MIP images confirms the above findings. CT ABDOMEN and PELVIS FINDINGS Hepatobiliary: No focal liver abnormality is seen. No gallstones, gallbladder wall thickening, or biliary dilatation. Pancreas: Unremarkable. No pancreatic ductal dilatation or surrounding inflammatory changes. Spleen: Normal in size without focal abnormality. Adrenals/Urinary Tract: Adrenal glands are unremarkable. Kidneys are normal, without renal calculi, focal lesion, or hydronephrosis. Bladder is distended. Stomach/Bowel: Stomach decompressed. Small bowel is nondilated. Appendix not discretely identified. Moderate proximal colonic fecal material. There is mild gaseous distention of the transverse colon and a portion of the sigmoid segment. No significant diverticular disease nor evidence of obstruction. Vascular/Lymphatic: Moderate calcified aortoiliac plaque without aneurysm. Portal vein patent. No abdominal or pelvic adenopathy. Reproductive: Status post hysterectomy. No adnexal masses. Other: No ascites.  No free air. Musculoskeletal: No acute or significant osseous findings. Review of the MIP images confirms the above findings. IMPRESSION: 1. Negative for acute PE or thoracic aortic dissection. 2. Left upper lobe infiltrate possibly pneumonia. Followup PA and lateral chest X-ray is recommended in 3-4 weeks following trial of antibiotic therapy to ensure resolution and exclude underlying malignancy. 3. Mild colonic distension with proximal fecal material, no evidence of obstruction. Electronically Signed   By: Corlis Leak M.D.   On: 01/21/2021 14:42   CT ABDOMEN PELVIS W CONTRAST  Result Date: 01/31/2021 CLINICAL DATA:  Nausea, diarrhea, high prob PE suspected EXAM: CT ANGIOGRAPHY CHEST CT ABDOMEN AND PELVIS WITH CONTRAST TECHNIQUE: Multidetector CT imaging of the chest was performed using the standard  protocol during bolus administration of intravenous contrast. Multiplanar CT image reconstructions and MIPs were obtained to evaluate the vascular anatomy. Multidetector CT imaging of the abdomen and pelvis was performed using the standard protocol during bolus administration of intravenous contrast. CONTRAST:  7mL OMNIPAQUE IOHEXOL 350 MG/ML SOLN COMPARISON:  None. FINDINGS: CTA CHEST FINDINGS Cardiovascular: Heart size normal. No pericardial effusion. Satisfactory opacification of pulmonary arteries noted, and there is no evidence of pulmonary emboli. Scattered coronary calcifications. Adequate contrast opacification of the thoracic aorta with no evidence of dissection, aneurysm, or stenosis. There is classic 3-vessel brachiocephalic arch anatomy without proximal stenosis. Moderate partially calcified plaque in the arch and descending thoracic segment. Mediastinum/Nodes: No mass or adenopathy. Lungs/Pleura: No significant pleural effusion. No pneumothorax. Focal infiltrate in the left upper lobe near the hilum. Subsegmental dependent atelectasis in the left lower lobe. Musculoskeletal: Anterior vertebral endplate spurring at multiple levels in the lower thoracic spine. Review of the MIP images confirms the above findings. CT ABDOMEN and PELVIS FINDINGS Hepatobiliary: No focal liver abnormality is seen. No gallstones, gallbladder wall thickening, or biliary dilatation. Pancreas: Unremarkable. No pancreatic ductal dilatation or surrounding inflammatory changes. Spleen: Normal in size without focal abnormality. Adrenals/Urinary Tract: Adrenal glands are unremarkable. Kidneys are normal, without renal calculi, focal lesion, or hydronephrosis. Bladder is distended. Stomach/Bowel: Stomach decompressed. Small bowel is nondilated. Appendix not discretely identified. Moderate proximal colonic fecal material. There is mild gaseous distention of the transverse colon and a portion of the sigmoid segment. No significant  diverticular disease nor evidence of obstruction. Vascular/Lymphatic: Moderate calcified aortoiliac plaque without aneurysm. Portal vein patent. No abdominal or pelvic adenopathy. Reproductive: Status post hysterectomy. No adnexal masses. Other: No ascites.  No free air. Musculoskeletal: No acute or significant osseous findings. Review of the MIP images confirms the above  findings. IMPRESSION: 1. Negative for acute PE or thoracic aortic dissection. 2. Left upper lobe infiltrate possibly pneumonia. Followup PA and lateral chest X-ray is recommended in 3-4 weeks following trial of antibiotic therapy to ensure resolution and exclude underlying malignancy. 3. Mild colonic distension with proximal fecal material, no evidence of obstruction. Electronically Signed   By: Corlis Leak M.D.   On: 02/08/2021 14:42    Scheduled Meds:  aspirin EC  81 mg Oral Daily   atorvastatin  20 mg Oral Daily   colchicine  0.6 mg Oral Once   cyclobenzaprine  5 mg Oral QHS   diclofenac  75 mg Oral Q breakfast   folic acid  1 mg Oral Daily   hydrOXYzine  25 mg Oral TID   lidocaine  1 patch Transdermal Q24H   lisinopril  40 mg Oral Daily   metoprolol succinate  25 mg Oral Daily   mirtazapine  45 mg Oral QHS   nicotine  21 mg Transdermal Daily   pantoprazole  20 mg Oral Daily   sertraline  150 mg Oral Daily   traZODone  100 mg Oral QHS   Continuous Infusions:  0.9 % NaCl with KCl 20 mEq / L 50 mL/hr at 02/05/21 1504   azithromycin (ZITHROMAX) 500 MG IVPB (Vial-Mate [employer]) 500 mg (02/05/21 1511)   cefTRIAXone (ROCEPHIN)  IV 2 g (02/05/21 1609)   thiamine injection 500 mg (02/06/21 0545)    Assessment/Plan:  Severe sepsis, acute metabolic encephalopathy, tachycardia and leukocytosis which was present on admission.  Streptococcus agalactiae growing out of blood cultures.  Patient has left upper lobe pneumonia on CT scan.  On Rocephin and Zithromax.  Repeat blood cultures this morning. Alcohol abuse and alcohol  withdrawal.  Hallucinations.  Continue high-dose thiamine.  Continue alcohol withdrawal protocol. Acute hypoxic respiratory failure.  Pulse ox 89% on 2 L of oxygen need to be bumped up to 3 L of oxygen. Thrombocytopenia.  Likely secondary to alcohol but it could also be related to infection. Hyponatremia on presentation likely secondary to alcohol abuse Hypokalemia.  Gentle IV fluids and potassium and IV fluids Diarrhea prior to coming in and having further diarrhea discontinue stool studies and isolation. Right shoulder pain.  CT scan of the chest negative for abnormality and right shoulder x-ray also negative. Floaters Depression anxiety on Zoloft and trazodone Hyperlipidemia unspecified on Lipitor Elevated troponin secondary to demand ischemia on aspirin and Lipitor        Code Status:     Code Status Orders  (From admission, onward)           Start     Ordered   01/24/2021 1700  Full code  Continuous        02/14/2021 1659           Code Status History     Date Active Date Inactive Code Status Order ID Comments User Context   01/26/2019 1900 01/30/2019 1702 Full Code [id number]  Pearson Grippe, MD ED      Family Communication: Spoke with the patient's sister at the bedside this morning and then son on the phone in the afternoon Disposition Plan: Status is: Inpatient  Dispo: The patient is from: Home              Anticipated d/c is to: To be determined based on clinical status              Patient currently going through alcohol withdrawal and being treated for strep agalactiae  sepsis   Difficult to place patient.  No.  Consultants: Infectious disease  Antibiotics: Rocephin Zithromax  Time spent: 27 minutes  [employer]

## 2021-02-06 NOTE — Consult Note (Signed)
Reason for Consult:floaters, in both eyes. Referring Physician: Katrinka Blazing MD, [facility] ED Chief complaint: floaters.  HPI: Amy Becker is an 70 y.o. female with past ocular history significant only for cataract surgery in both eyes.  She presented to the ED with multiple complaints, including floaters in both eyes and was found to be septic, with pneumonia and diarrhea.  History is obtained today is highly unreliable from patient, who is accompanied by her youngest son.  The patient is unable to answer most questions with a coherent response.  I attempted to examine the patient yesterday, but deferred to today because of altered mental status and hallucinations.  According to her son, her mental status is much improved today.  Past Medical History:  Diagnosis Date   Alcohol abuse    Hypertension    Hyponatremia 01/26/2019    ROS  Past Surgical History:  Procedure Laterality Date   ABDOMINAL HYSTERECTOMY      Family History  Problem Relation Age of Onset   CAD Mother     Social History:  reports that she has been smoking cigarettes. She has a 40.00 pack-year smoking history. She has never used smokeless tobacco. She reports current alcohol use. She reports that she does not use drugs.  Allergies:  Allergies  Allergen Reactions   Hydromorphone Hives   Cefaclor Hives   Tetracycline Rash    Prior to Admission medications   Medication Sig Start Date End Date Taking? Authorizing Provider  atorvastatin (LIPITOR) 20 MG tablet Take 20 mg by mouth daily. 10/22/20  Yes [provider]  cyclobenzaprine (FLEXERIL) 5 MG tablet Take 5 mg by mouth 2 (two) times daily. 01/01/21  Yes [provider]  diclofenac (VOLTAREN) 75 MG EC tablet Take 75 mg by mouth 2 (two) times daily with a meal.   Yes [provider]  gabapentin (NEURONTIN) 600 MG tablet Take 600 mg by mouth 2 (two) times a day.   Yes [provider]  hydrOXYzine (ATARAX/VISTARIL) 25 MG tablet Take  25 mg by mouth 3 (three) times daily. 01/28/21  Yes [provider]  lansoprazole (PREVACID) 30 MG capsule Take 30 mg by mouth daily. 12/27/20  Yes [provider]  lisinopril (ZESTRIL) 40 MG tablet Take 1 tablet (40 mg total) by mouth daily. 01/30/19 01/18/2021 Yes Kyle, Tyrone A, DO  LORazepam (ATIVAN) 1 MG tablet Take 1 mg by mouth daily as needed for anxiety.   Yes [provider]  metoprolol succinate (TOPROL-XL) 25 MG 24 hr tablet Take 25 mg by mouth daily. 01/28/21  Yes [provider]  mirtazapine (REMERON) 30 MG tablet Take 30 mg by mouth at bedtime. 01/01/21  Yes [provider]  sertraline (ZOLOFT) 100 MG tablet Take 100 mg by mouth daily.   Yes [provider]  traZODone (DESYREL) 100 MG tablet Take 100 mg by mouth at bedtime.   Yes [provider]  calcium carbonate (TUMS - DOSED IN MG ELEMENTAL CALCIUM) 500 MG chewable tablet Chew 2 tablets (400 mg of elemental calcium total) by mouth 3 (three) times daily as needed for indigestion or heartburn. 01/29/19   Margie Ege A, DO  estradiol (CLIMARA - DOSED IN MG/24 HR) 0.0375 mg/24hr patch Place 0.0375 mg onto the skin once a week. 01/23/21   [provider]  folic acid (FOLVITE) 1 MG tablet Take 1 tablet (1 mg total) by mouth daily. Patient not taking: No sig reported 01/30/19   Margie Ege A, DO  Multiple Vitamin (MULTIVITAMIN  WITH MINERALS) TABS tablet Take 1 tablet by mouth daily. Patient not taking: No sig reported 01/30/19   Margie Ege A, DO    Results for orders placed or performed during the hospital encounter of 01/26/2021 (from the past 48 hour(s))  Expectorated Sputum Assessment w Gram Stain, Rflx to Resp Cult     Status: None   Collection Time: 01/23/2021  7:15 PM   Specimen: SPU  Result Value Ref Range   Specimen Description SPUTUM    Special Requests NONE    Sputum evaluation      THIS SPECIMEN IS ACCEPTABLE FOR SPUTUM CULTURE Performed at [facility], [address], [place], [place] [address]    Report Status 02/05/2021 FINAL   Culture, Respiratory w Gram Stain     Status: None (Preliminary result)   Collection Time: 01/17/2021  7:15 PM   Specimen: SPU  Result Value Ref Range   Specimen Description      SPUTUM Performed at [facility], [address], [place], [place] [address]    Special Requests      NONE Reflexed from [id number] Performed at [facility], [address] [place], [place] [address]    Gram Stain      FEW WBC PRESENT, PREDOMINANTLY MONONUCLEAR NO ORGANISMS SEEN    Culture      NO GROWTH < 12 HOURS Performed at [facility], [address] [place], [place] [address]    Report Status PENDING   Basic metabolic panel     Status: Abnormal   Collection Time: 02/05/21  4:36 AM  Result Value Ref Range   Sodium 132 (L) 135 - 145 mmol/L   Potassium 3.8 3.5 - 5.1 mmol/L   Chloride 99 98 - 111 mmol/L   CO2 18 (L) 22 - 32 mmol/L   Glucose, Bld 73 70 - 99 mg/dL    Comment: Glucose reference range applies only to samples taken after fasting for at least 8 hours.   BUN 20 8 - 23 mg/dL   Creatinine, Ser 5.78 0.44 - 1.00 mg/dL   Calcium 8.6 (L) 8.9 - 10.3 mg/dL   GFR, Estimated >46 >96 mL/min    Comment: (NOTE) Calculated using the CKD-EPI Creatinine Equation (2021)    Anion gap 15 5 - 15    Comment: Performed at [facility], [address] [place], [place] [address]  CBC     Status: Abnormal   Collection Time: 02/05/21  4:36 AM  Result Value Ref Range   WBC 20.3 (H) 4.0 - 10.5 K/uL   RBC 4.19 3.87 - 5.11 MIL/uL   Hemoglobin 13.6 12.0 - 15.0 g/dL   HCT 41.3 24.4 - 01.0 %   MCV 87.1 80.0 - 100.0 fL   MCH 32.5 26.0 - 34.0 pg   MCHC 37.3 (H) 30.0 - 36.0 g/dL   RDW 27.2 53.6 - 64.4 %   Platelets 49 (L) 150 - 400 K/uL    Comment: Immature Platelet Fraction may be clinically indicated, consider ordering this additional test [id number] CONSISTENT WITH PREVIOUS RESULT     nRBC 0.0 0.0 - 0.2 %    Comment: Performed at [facility], [address] [place], [place] [address]  Lipid panel     Status: Abnormal   Collection Time: 02/05/21  4:36 AM  Result Value Ref Range   Cholesterol 68 0 - 200 mg/dL   Triglycerides 89 <564 mg/dL   HDL 19 (L) >33 mg/dL  Total CHOL/HDL Ratio 3.6 RATIO   VLDL 18 0 - 40 mg/dL   LDL Cholesterol 31 0 - 99 mg/dL    Comment:        Total Cholesterol/HDL:CHD Risk Coronary Heart Disease Risk Table                     Men   Women  1/2 Average Risk   3.4   3.3  Average Risk       5.0   4.4  2 X Average Risk   9.6   7.1  3 X Average Risk  23.4   11.0        Use the calculated Patient Ratio above and the CHD Risk Table to determine the patient's CHD Risk.        ATP III CLASSIFICATION (LDL):  <100     mg/dL   Optimal  [id number]  mg/dL   Near or Above                    Optimal  130-159  mg/dL   Borderline  [id number]  mg/dL   High  >440>190     mg/dL   Very High Performed at [facility], [address] [place], [place] [address]   Magnesium     Status: None   Collection Time: 02/05/21  4:36 AM  Result Value Ref Range   Magnesium 2.2 1.7 - 2.4 mg/dL    Comment: Performed at [facility], [address] [place], [place] [address]  Comprehensive metabolic panel     Status: Abnormal   Collection Time: 02/05/21 11:43 AM  Result Value Ref Range   Sodium 133 (L) 135 - 145 mmol/L   Potassium 3.4 (L) 3.5 - 5.1 mmol/L   Chloride 104 98 - 111 mmol/L   CO2 18 (L) 22 - 32 mmol/L   Glucose, Bld 104 (H) 70 - 99 mg/dL    Comment: Glucose reference range applies only to samples taken after fasting for at least 8 hours.   BUN 21 8 - 23 mg/dL   Creatinine, Ser 4.030.56 0.44 - 1.00 mg/dL   Calcium 8.5 (L) 8.9 - 10.3 mg/dL   Total Protein 5.0 (L) 6.5 - 8.1 g/dL   Albumin 2.1 (L) 3.5 - 5.0 g/dL   AST 36 15 - 41 U/L   ALT 12 0 - 44 U/L   Alkaline Phosphatase 76 38 - 126 U/L   Total Bilirubin 1.5 (H) 0.3 - 1.2 mg/dL    GFR, Estimated >47>60 >42>60 mL/min    Comment: (NOTE) Calculated using the CKD-EPI Creatinine Equation (2021)    Anion gap 11 5 - 15    Comment: Performed at [facility], [address] [place], [place] [address]  Magnesium     Status: None   Collection Time: 02/05/21 11:43 AM  Result Value Ref Range   Magnesium 1.9 1.7 - 2.4 mg/dL    Comment: Performed at [facility], [address] [place], [place] [address]  Phosphorus     Status: None   Collection Time: 02/05/21 11:43 AM  Result Value Ref Range   Phosphorus 3.4 2.5 - 4.6 mg/dL    Comment: Performed at [facility], [address] [place], [place] [address]  CBC     Status: Abnormal   Collection Time: 02/05/21 11:43 AM  Result Value Ref Range   WBC 15.9 (H) 4.0 - 10.5 K/uL   RBC 3.95 3.87 -  5.11 MIL/uL   Hemoglobin 12.7 12.0 - 15.0 g/dL   HCT 17.9 (L) 15.0 - 56.9 %   MCV 86.8 80.0 - 100.0 fL   MCH 32.2 26.0 - 34.0 pg   MCHC 37.0 (H) 30.0 - 36.0 g/dL   RDW 79.4 80.1 - 65.5 %   Platelets 51 (L) 150 - 400 K/uL    Comment: Immature Platelet Fraction may be clinically indicated, consider ordering this additional test [id number]    nRBC 0.0 0.0 - 0.2 %    Comment: Performed at [facility], [address], [place], [place] [address]  Uric acid     Status: None   Collection Time: 02/05/21 11:43 AM  Result Value Ref Range   Uric Acid, Serum 3.8 2.5 - 7.1 mg/dL    Comment: Performed at [facility], [address] [place], [place] [address]  CULTURE, BLOOD (ROUTINE X 2) w Reflex to ID Panel     Status: None (Preliminary result)   Collection Time: 02/06/21  1:00 AM   Specimen: BLOOD RIGHT HAND  Result Value Ref Range   Specimen Description BLOOD RIGHT HAND    Special Requests      BOTTLES DRAWN AEROBIC AND ANAEROBIC Blood Culture adequate volume   Culture      NO GROWTH < 12 HOURS Performed at [facility], [address], [place], [place] [address]    Report  Status PENDING   CULTURE, BLOOD (ROUTINE X 2) w Reflex to ID Panel     Status: None (Preliminary result)   Collection Time: 02/06/21  1:01 AM   Specimen: BLOOD LEFT HAND  Result Value Ref Range   Specimen Description BLOOD LEFT HAND    Special Requests      BOTTLES DRAWN AEROBIC AND ANAEROBIC Blood Culture results may not be optimal due to an inadequate volume of blood received in culture bottles   Culture      NO GROWTH < 12 HOURS Performed at [facility], [address], [place], [place] [address]    Report Status PENDING     No results found.  Blood pressure (!) 172/88, pulse 79, temperature 98.8 F (37.1 C), resp. rate 18, height 5\' 1"  (1.549 m), weight 61.2 kg, SpO2 (!) 89 %.  Mental status: Alert and Oriented to self "Zian"  not able to report current president.  Place: "outer space"  Visual Acuity:  20/200+ unreliable OU, without correction; near card.  Pupils:  Equally round/ reactive to light.  No Afferent defect.  Motility:  unable to cooperate reliably with commands.  No   Visual Fields:  poor cooperation.  IOP:  soft to palpation.  External/ Lids/ Lashes:  Normal  Anterior Segment: (portable slit lamp).  Conjunctiva:  Normal  OU  Cornea:  Normal  OU  Anterior Chamber: Normal  OU  Lens:   PCIOL OU  Posterior Segment: Dilated OU with 1% Tropicamide and 2.5% Phenylephrine   Vitreous: mild diffuse vitreous hemorrhage with minimal hemorrhagic floaters, both eyes.  Discs:   Normal c/d ratio, no pallor, no edema OU  Macula:  Normal  Vessels/ Periphery: Scattered DBH in the periphery, inferior and temporally, both eyes.    Assessment/Plan:  Valsalva retinopathy, both eyes, with some mild breakthrough vitreous hemorrhage in both eyes.   This is responsible for her complaints of floaters and blurry vision in both eyes   Risk factors include coughing or straining for a bowel movement, as well as thromboyctopenia.  Otherwise reassuring objective  findings  for eye exam.  Patient unable to participate in subjective portions of exam.   Overall this is likely self limited and will resolve over weeks without any specific treatment. At this time, recommend observation only.  Please avoid unnecessary blood thinners.  Will re-examine if patient remains in house in 2 weeks.    Otherwise recommend outpatient followup with [facility] or her primary eye care provider within 2 weeks upon discharge.  Please reconsult if patient's vision symptoms change significantly.      Willey Blade 02/06/2021, 6:04 PM

## 2021-02-06 NOTE — Progress Notes (Signed)
ID Patient is slightly better today More alert Continues to have pain in the shoulder and and the hand Does not have any dysuria Cough better  On examination more alert and awake In no discomfort at rest Chest bilateral air entry Crypts in the bases Heart sound S1-S2 Abdomen soft CNS nonfocal Tenderness over the right thumb swelling on the right DIP Tenderness and limited movement of the right shoulder No swelling  Labs CBC Latest Ref Rng & Units 02/05/2021 02/05/2021 02/06/2021  WBC 4.0 - 10.5 K/uL 15.9(H) 20.3(H) 15.0(H)  Hemoglobin 12.0 - 15.0 g/dL 45.6 25.6 15.9(H)  Hematocrit 36.0 - 46.0 % 34.3(L) 36.5 44.6  Platelets 150 - 400 K/uL 51(L) 49(L) 53(L)     CMP Latest Ref Rng & Units 02/05/2021 02/05/2021 01/22/2021  Glucose 70 - 99 mg/dL 389(H) 73 80  BUN 8 - 23 mg/dL 21 20 20   Creatinine 0.44 - 1.00 mg/dL 7.34 2.87  Sodium 135 - 145 mmol/L 133(L) 132(L) 130(L)  Potassium 3.5 - 5.1 mmol/L 3.4(L) 3.8 2.9(L)  Chloride 98 - 111 mmol/L 104 99 97(L)  CO2 22 - 32 mmol/L 18(L) 18(L) 18(L)  Calcium 8.9 - 10.3 mg/dL 6.81) 1.5(B) 2.6(O)  Total Protein 6.5 - 8.1 g/dL 5.0(L) - 6.0(L)  Total Bilirubin 0.3 - 1.2 mg/dL 0.3(T) - 1.9(H)  Alkaline Phos 38 - 126 U/L 76 - 101  AST 15 - 41 U/L 36 - 34  ALT 0 - 44 U/L 12 - 11    Micro 02/16/2021--1 set of blood culture has group B streptococcus 02/09/2021 urine culture is group B streptococcus 02/06/2021 blood culture sent  Impression/recommendation  Group B streptococcus bacteremia with urinary tract infection CT scan of the abdomen did not show any hydronephrosis or renal stones.  Bladder appeared distended Patient needs a few post void bladder scan to look at the residual urine. Change ceftriaxone to cefazolin.  Cough with questionable left upper lobe pneumonia.  Rule out aspiration.  Is improving.  Alcohol abuse with withdrawal symptoms.  On Ativan.  Hyponatremia and hypokalemia secondary to alcohol use  Diarrhea is  resolved.  Thrombocytopenia could be from alcohol and possibly infection compounding it.  Hypertension on lisinopril  Hyperlipidemia on atorvastatin  Tender and swollen right thumb likely gout.  Uric acid is normal would recommend rheumatology consult she because she also has shoulder pain and tenderness.     Discussed the management with the son and care team.

## 2021-02-07 DIAGNOSIS — M25511 Pain in right shoulder: Secondary | ICD-10-CM

## 2021-02-07 DIAGNOSIS — D696 Thrombocytopenia, unspecified: Secondary | ICD-10-CM | POA: Diagnosis not present

## 2021-02-07 DIAGNOSIS — R7881 Bacteremia: Secondary | ICD-10-CM | POA: Diagnosis not present

## 2021-02-07 DIAGNOSIS — B951 Streptococcus, group B, as the cause of diseases classified elsewhere: Secondary | ICD-10-CM | POA: Diagnosis not present

## 2021-02-07 DIAGNOSIS — A419 Sepsis, unspecified organism: Secondary | ICD-10-CM | POA: Diagnosis not present

## 2021-02-07 DIAGNOSIS — J9601 Acute respiratory failure with hypoxia: Secondary | ICD-10-CM

## 2021-02-07 DIAGNOSIS — A491 Streptococcal infection, unspecified site: Secondary | ICD-10-CM | POA: Diagnosis not present

## 2021-02-07 DIAGNOSIS — F10231 Alcohol dependence with withdrawal delirium: Secondary | ICD-10-CM | POA: Diagnosis not present

## 2021-02-07 LAB — BASIC METABOLIC PANEL
Anion gap: 9 (ref 5–15)
BUN: 21 mg/dL (ref 8–23)
CO2: 17 mmol/L — ABNORMAL LOW (ref 22–32)
Calcium: 8.7 mg/dL — ABNORMAL LOW (ref 8.9–10.3)
Chloride: 116 mmol/L — ABNORMAL HIGH (ref 98–111)
Creatinine, Ser: 0.61 mg/dL (ref 0.44–1.00)
GFR, Estimated: >60 mL/min (ref >60)
Glucose, Bld: 103 mg/dL — ABNORMAL HIGH (ref 70–99)
Potassium: 2.9 mmol/L — ABNORMAL LOW (ref 3.5–5.1)
Sodium: 142 mmol/L (ref 135–145)

## 2021-02-07 LAB — MAGNESIUM: Magnesium: 2 mg/dL (ref 1.7–2.4)

## 2021-02-07 LAB — CBC
HCT: 36.2 % (ref 36.0–46.0)
Hemoglobin: 13.2 g/dL (ref 12.0–15.0)
MCH: 31.1 pg (ref 26.0–34.0)
MCHC: 36.5 g/dL — ABNORMAL HIGH (ref 30.0–36.0)
MCV: 85.2 fL (ref 80.0–100.0)
Platelets: 124 10*3/uL — ABNORMAL LOW (ref 150–400)
RBC: 4.25 MIL/uL (ref 3.87–5.11)
RDW: 13.3 % (ref 11.5–15.5)
WBC: 31.6 10*3/uL — ABNORMAL HIGH (ref 4.0–10.5)
nRBC: 0 % (ref 0.0–0.2)

## 2021-02-07 LAB — C-REACTIVE PROTEIN: CRP: 40.8 mg/dL — ABNORMAL HIGH (ref <1.0)

## 2021-02-07 LAB — CULTURE, BLOOD (SINGLE)

## 2021-02-07 LAB — PROTIME-INR
INR: 1.2 (ref 0.8–1.2)
Prothrombin Time: 15.4 seconds — ABNORMAL HIGH (ref 11.4–15.2)

## 2021-02-07 LAB — POTASSIUM: Potassium: 2.9 mmol/L — ABNORMAL LOW (ref 3.5–5.1)

## 2021-02-07 LAB — SEDIMENTATION RATE: Sed Rate: 67 mm/hr — ABNORMAL HIGH (ref 0–30)

## 2021-02-07 MED ORDER — BETHANECHOL CHLORIDE 10 MG PO TABS
10.0000 mg | ORAL_TABLET | Freq: Three times a day (TID) | ORAL | Status: AC
  Administered 2021-02-07 – 2021-02-08 (×3): 10 mg via ORAL
  Filled 2021-02-07 (×3): qty 1

## 2021-02-07 MED ORDER — POTASSIUM CHLORIDE CRYS ER 20 MEQ PO TBCR
40.0000 meq | EXTENDED_RELEASE_TABLET | Freq: Two times a day (BID) | ORAL | Status: DC
  Administered 2021-02-07: 09:00:00 40 meq via ORAL
  Filled 2021-02-07 (×2): qty 2

## 2021-02-07 MED ORDER — THIAMINE HCL 100 MG PO TABS
100.0000 mg | ORAL_TABLET | Freq: Every day | ORAL | Status: DC
  Administered 2021-02-07 – 2021-02-08 (×2): 100 mg via ORAL
  Filled 2021-02-07 (×2): qty 1

## 2021-02-07 MED ORDER — POTASSIUM CHLORIDE 10 MEQ/100ML IV SOLN
10.0000 meq | INTRAVENOUS | Status: AC
  Administered 2021-02-07 (×2): 10 meq via INTRAVENOUS
  Filled 2021-02-07 (×2): qty 100

## 2021-02-07 MED ORDER — POTASSIUM CHLORIDE 10 MEQ/100ML IV SOLN
10.0000 meq | INTRAVENOUS | Status: AC
  Administered 2021-02-08 (×3): 10 meq via INTRAVENOUS
  Filled 2021-02-07: qty 100

## 2021-02-07 NOTE — Progress Notes (Signed)
Patient ID: Amy Becker, female   DOB: Jan 12, 1951, 70 y.o.   MRN: [id number] [facility] PROGRESS NOTE  Amy Becker QMV:[id number] DOB: 1950/11/13 DOA: 02/12/21 PCP: Amy Aloe, MD  HPI/Subjective: Seen this morning while the nurse was trying to give her medications.  Still some periods of confusion.  Husband stated that she did eat better today.  Still having some pain in the shoulder.  White blood cell count jumped up today.  Objective: Vitals:   02/07/21 1321 02/07/21 1608  BP: (!) 164/82 (!) 166/77  Pulse: 83 90  Resp:  20  Temp:  97.8 F (36.6 C)  SpO2: 90% (!) 89%    Intake/Output Summary (Last 24 hours) at 02/07/2021 1615 Last data filed at 02/07/2021 1110 Gross per 24 hour  Intake --  Output 1100 ml  Net -1100 ml   Filed Weights   02/12/21 1043  Weight: 61.2 kg    ROS: Review of Systems  Unable to perform ROS: Acuity of condition  Respiratory:  Negative for shortness of breath.   Cardiovascular:  Negative for chest pain.  Gastrointestinal:  Negative for abdominal pain.  Musculoskeletal:  Positive for joint pain.  Exam: Physical Exam HENT:     Head: Normocephalic.     Mouth/Throat:     Pharynx: No oropharyngeal exudate.  Eyes:     General: Lids are normal.     Conjunctiva/sclera: Conjunctivae normal.  Cardiovascular:     Rate and Rhythm: Normal rate and regular rhythm.     Heart sounds: Normal heart sounds, S1 normal and S2 normal.  Pulmonary:     Breath sounds: Examination of the right-lower field reveals decreased breath sounds. Examination of the left-lower field reveals decreased breath sounds. Decreased breath sounds present. No wheezing, rhonchi or rales.  Abdominal:     Palpations: Abdomen is soft.     Tenderness: There is no abdominal tenderness.  Musculoskeletal:     Right ankle: No swelling.     Left ankle: No swelling.  Skin:    General: Skin is warm.     Comments: Bruising bilateral arms wrists and hands.   Neurological:     Mental Status: She is confused.     Data Reviewed: Basic Metabolic Panel: Recent Labs  Lab February 12, 2021 1356 12-Feb-2021 1440 2021/02/12 1759 02/05/21 0436 02/05/21 1143 02/07/21 0418  NA  --  130*  --  132* 133* 142  K  --  2.9*  --  3.8 3.4* 2.9*  CL  --  97*  --  99 104 116*  CO2  --  18*  --  18* 18* 17*  GLUCOSE  --  80  --  73 104* 103*  BUN  --  20  --  20 21 21   CREATININE 0.50 0.66  --  0.54 0.56 0.61  CALCIUM  --  8.8*  --  8.6* 8.5* 8.7*  MG  --  1.7  --  2.2 1.9 2.0  PHOS  --   --  3.7  --  3.4  --    Liver Function Tests: Recent Labs  Lab 12-Feb-2021 1440 02/05/21 1143  AST 34 36  ALT 11 12  ALKPHOS 101 76  BILITOT 1.9* 1.5*  PROT 6.0* 5.0*  ALBUMIN 2.6* 2.1*   CBC: Recent Labs  Lab 12-Feb-2021 1045 02/05/21 0436 02/05/21 1143 02/07/21 0418  WBC 15.0* 20.3* 15.9* 31.6*  HGB 15.9* 13.6 12.7 13.2  HCT 44.6 36.5 34.3* 36.2  MCV 89.6 87.1 86.8 85.2  PLT 53* 49* 51* 124*     Recent Results (from the past 240 hour(s))  Urine Culture     Status: Abnormal   Collection Time: 01/30/2021 10:45 AM   Specimen: Urine, Clean Catch  Result Value Ref Range Status   Specimen Description   Final    URINE, CLEAN CATCH Performed at [facility], [address] [place], [place] [address]    Special Requests   Final    NONE Performed at [facility], [address] [place], [place] [address]    Culture (A)  Final    50,000 COLONIES/mL GROUP B STREP(S.AGALACTIAE)ISOLATED TESTING AGAINST S. AGALACTIAE NOT ROUTINELY PERFORMED DUE TO PREDICTABILITY OF AMP/PEN/VAN SUSCEPTIBILITY. Performed at [facility], [address] [place], [place] [address]    Report Status 02/06/2021 FINAL  Final  Resp Panel by RT-PCR (Flu A&B, Covid) Nasopharyngeal Swab     Status: None   Collection Time: 01/30/2021  1:37 PM   Specimen: Nasopharyngeal Swab; Nasopharyngeal(NP) swabs in vial transport medium  Result Value Ref Range Status   SARS  Coronavirus 2 by RT PCR NEGATIVE NEGATIVE Final    Comment: (NOTE) SARS-CoV-2 target nucleic acids are NOT DETECTED.  The SARS-CoV-2 RNA is generally detectable in upper respiratory specimens during the acute phase of infection. The lowest concentration of SARS-CoV-2 viral copies this assay can detect is 138 copies/mL. A negative result does not preclude SARS-Cov-2 infection and should not be used as the sole basis for treatment or other patient management decisions. A negative result may occur with  improper specimen collection/handling, submission of specimen other than nasopharyngeal swab, presence of viral mutation(s) within the areas targeted by this assay, and inadequate number of viral copies(<138 copies/mL). A negative result must be combined with clinical observations, patient history, and epidemiological information. The expected result is Negative.  Fact Sheet for Patients:  BloggerCourse.comhttps://www.fda.gov/media/152166/download  Fact Sheet for Healthcare Providers:  SeriousBroker.ithttps://www.fda.gov/media/152162/download  This test is no t yet approved or cleared by the [place] FDA and  has been authorized for detection and/or diagnosis of SARS-CoV-2 by FDA under an Emergency Use Authorization (EUA). This EUA will remain  in effect (meaning this test can be used) for the duration of the COVID-19 declaration under Section 564(b)(1) of the Act, 21 U.S.C.section 360bbb-3(b)(1), unless the authorization is terminated  or revoked sooner.       Influenza A by PCR NEGATIVE NEGATIVE Final   Influenza B by PCR NEGATIVE NEGATIVE Final    Comment: (NOTE) The Xpert Xpress SARS-CoV-2/FLU/RSV plus assay is intended as an aid in the diagnosis of influenza from Nasopharyngeal swab specimens and should not be used as a sole basis for treatment. Nasal washings and aspirates are unacceptable for Xpert Xpress SARS-CoV-2/FLU/RSV testing.  Fact Sheet for  Patients: BloggerCourse.comhttps://www.fda.gov/media/152166/download  Fact Sheet for Healthcare Providers: SeriousBroker.ithttps://www.fda.gov/media/152162/download  This test is not yet approved or cleared by the [place] FDA and has been authorized for detection and/or diagnosis of SARS-CoV-2 by FDA under an Emergency Use Authorization (EUA). This EUA will remain in effect (meaning this test can be used) for the duration of the COVID-19 declaration under Section 564(b)(1) of the Act, 21 U.S.C. section 360bbb-3(b)(1), unless the authorization is terminated or revoked.  Performed at [facility], [address] [place], [place] [address]   Blood culture (single)     Status: Abnormal   Collection Time: 01/20/2021  3:59 PM   Specimen: BLOOD  Result Value Ref Range Status   Specimen  Description   Final    BLOOD LEFT ANTECUBITAL Performed at [facility], [address] [place], [place] [address]    Special Requests   Final    BOTTLES DRAWN AEROBIC AND ANAEROBIC Blood Culture results may not be optimal due to an inadequate volume of blood received in culture bottles Performed at [facility], [address] [place], [place] [address]    Culture  Setup Time   Final    GRAM POSITIVE COCCI IN BOTH AEROBIC AND ANAEROBIC BOTTLES CRITICAL RESULT CALLED TO, READ BACK BY AND VERIFIED WITH: JASON ROBINS  ON 02/05/21 SKL Performed at [facility], [address], [place], [place] [address]    Culture GROUP B STREP(S.AGALACTIAE)ISOLATED (A)  Final   Report Status 02/07/2021 FINAL  Final   Organism ID, Bacteria GROUP B STREP(S.AGALACTIAE)ISOLATED  Final      Susceptibility   Group b strep(s.agalactiae)isolated - MIC*    CLINDAMYCIN <=0.25 SENSITIVE Sensitive     AMPICILLIN <=0.25 SENSITIVE Sensitive     ERYTHROMYCIN <=0.12 SENSITIVE Sensitive     VANCOMYCIN 0.5 SENSITIVE Sensitive     CEFTRIAXONE <=0.12 SENSITIVE Sensitive     LEVOFLOXACIN 1 SENSITIVE Sensitive      PENICILLIN Value in next row Sensitive      SENSITIVE0.06    * GROUP B STREP(S.AGALACTIAE)ISOLATED  Blood Culture ID Panel (Reflexed)     Status: Abnormal   Collection Time: 01/28/2021  3:59 PM  Result Value Ref Range Status   Enterococcus faecalis NOT DETECTED NOT DETECTED Final   Enterococcus Faecium NOT DETECTED NOT DETECTED Final   Listeria monocytogenes NOT DETECTED NOT DETECTED Final   Staphylococcus species NOT DETECTED NOT DETECTED Final   Staphylococcus aureus (BCID) NOT DETECTED NOT DETECTED Final   Staphylococcus epidermidis NOT DETECTED NOT DETECTED Final   Staphylococcus lugdunensis NOT DETECTED NOT DETECTED Final   Streptococcus species DETECTED (A) NOT DETECTED Final    Comment: CRITICAL RESULT CALLED TO, READ BACK BY AND VERIFIED WITH: JASON ROBINS  ON 02/05/21 SKL    Streptococcus agalactiae DETECTED (A) NOT DETECTED Final    Comment: CRITICAL RESULT CALLED TO, READ BACK BY AND VERIFIED WITH: JASON ROBINS  ON 02/05/21 SKL    Streptococcus pneumoniae NOT DETECTED NOT DETECTED Final   Streptococcus pyogenes NOT DETECTED NOT DETECTED Final   A.calcoaceticus-baumannii NOT DETECTED NOT DETECTED Final   Bacteroides fragilis NOT DETECTED NOT DETECTED Final   Enterobacterales NOT DETECTED NOT DETECTED Final   Enterobacter cloacae complex NOT DETECTED NOT DETECTED Final   Escherichia coli NOT DETECTED NOT DETECTED Final   Klebsiella aerogenes NOT DETECTED NOT DETECTED Final   Klebsiella oxytoca NOT DETECTED NOT DETECTED Final   Klebsiella pneumoniae NOT DETECTED NOT DETECTED Final   Proteus species NOT DETECTED NOT DETECTED Final   Salmonella species NOT DETECTED NOT DETECTED Final   Serratia marcescens NOT DETECTED NOT DETECTED Final   Haemophilus influenzae NOT DETECTED NOT DETECTED Final   Neisseria meningitidis NOT DETECTED NOT DETECTED Final   Pseudomonas aeruginosa NOT DETECTED NOT DETECTED Final   Stenotrophomonas maltophilia NOT DETECTED NOT DETECTED Final    Candida albicans NOT DETECTED NOT DETECTED Final   Candida auris NOT DETECTED NOT DETECTED Final   Candida glabrata NOT DETECTED NOT DETECTED Final   Candida krusei NOT DETECTED NOT DETECTED Final   Candida parapsilosis NOT DETECTED NOT DETECTED Final   Candida tropicalis NOT DETECTED NOT DETECTED Final   Cryptococcus neoformans/gattii NOT DETECTED NOT DETECTED Final  Comment: Performed at [facility], [address] [place], [place] [address]  Expectorated Sputum Assessment w Gram Stain, Rflx to Resp Cult     Status: None   Collection Time: 05-Mar-2021  7:15 PM   Specimen: SPU  Result Value Ref Range Status   Specimen Description SPUTUM  Final   Special Requests NONE  Final   Sputum evaluation   Final    THIS SPECIMEN IS ACCEPTABLE FOR SPUTUM CULTURE Performed at [facility], [address], [place], [place] [address]    Report Status 02/05/2021 FINAL  Final  Culture, Respiratory w Gram Stain     Status: None (Preliminary result)   Collection Time: 05-Mar-2021  7:15 PM   Specimen: SPU  Result Value Ref Range Status   Specimen Description   Final    SPUTUM Performed at [facility], [address], [place], [place] [address]    Special Requests   Final    NONE Reflexed from [id number] Performed at [facility], [address] [place], [place] [address]    Gram Stain   Final    FEW WBC PRESENT, PREDOMINANTLY MONONUCLEAR NO ORGANISMS SEEN    Culture   Final    CULTURE REINCUBATED FOR BETTER GROWTH Performed at [facility], [address], [place], [place] [address]    Report Status PENDING  Incomplete  CULTURE, BLOOD (ROUTINE X 2) w Reflex to ID Panel     Status: None (Preliminary result)   Collection Time: 02/06/21  1:00 AM   Specimen: BLOOD RIGHT HAND  Result Value Ref Range Status   Specimen Description BLOOD RIGHT HAND  Final   Special Requests   Final    BOTTLES DRAWN AEROBIC AND ANAEROBIC Blood Culture adequate volume    Culture   Final    NO GROWTH 1 DAY Performed at [facility], [address], [place], [place] [address]    Report Status PENDING  Incomplete  CULTURE, BLOOD (ROUTINE X 2) w Reflex to ID Panel     Status: None (Preliminary result)   Collection Time: 02/06/21  1:01 AM   Specimen: BLOOD LEFT HAND  Result Value Ref Range Status   Specimen Description BLOOD LEFT HAND  Final   Special Requests   Final    BOTTLES DRAWN AEROBIC AND ANAEROBIC Blood Culture results may not be optimal due to an inadequate volume of blood received in culture bottles   Culture   Final    NO GROWTH 1 DAY Performed at [facility], [address] [place] [address] [place], [place] [address]    Report Status PENDING  Incomplete      Scheduled Meds:  aspirin EC  81 mg Oral Daily   atorvastatin  20 mg Oral Daily   colchicine  0.6 mg Oral Daily   cyclobenzaprine  5 mg Oral QHS   diclofenac  75 mg Oral Q breakfast   folic acid  1 mg Oral Daily   hydrOXYzine  25 mg Oral TID   lidocaine  1 patch Transdermal Q24H   lisinopril  40 mg Oral Daily   metoprolol succinate  25 mg Oral Daily   mirtazapine  45 mg Oral QHS   nicotine  21 mg Transdermal Daily   pantoprazole  20 mg Oral Daily   potassium chloride  40 mEq Oral BID   sertraline  150 mg Oral Daily   thiamine  100 mg Oral Daily   traZODone  100 mg Oral QHS  Continuous Infusions:  0.9 % NaCl with KCl 20 mEq / L 50 mL/hr at 02/07/21 3212    ceFAZolin (ANCEF) IV 2 g (02/07/21 1440)    Assessment/Plan:  Severe sepsis, acute metabolic encephalopathy, tachycardia and leukocytosis which was present on admission.  Streptococcus agalactiae growing out of blood cultures.  Patient has left upper lobe pneumonia on CT scan of the chest.  Patient now on Ancef.  Repeat blood cultures so far negative.  Urine culture also has strep agalactiae and.  Patient does have intermittent urinary retention.  We will give a trial of bethanechol.  In and out catheterizations as  needed.  White blood cell count up higher today.  Acute Alcohol abuse with alcohol withdrawal.  Had hallucinations.  Finished high-dose thiamine and now on low-dose thiamine.  Patient still requiring alcohol withdrawal protocol. Acute hypoxic respiratory failure.  We will repeat a chest x-ray tomorrow morning.  Patient on 4 L of oxygen and O2 saturation is 89 to 90% Joint pain and shoulder pain.  Appreciate orthopedic consultation Thrombocytopenia.  Platelet count coming up at this point likely secondary to alcohol. Hyponatremia on presentation secondary to alcohol.  Sodium in the normal range. Hypokalemia potassium low today.  Give IV and oral potassium.  Repeat labs tomorrow Floaters.  Ophthalmology saw the retinopathy bilateral eyes.  They believe that this should resolve over a weeks without treatment.  We will discontinue aspirin. Depression and anxiety on Zoloft and trazodone Hyperlipidemia unspecified on Lipitor Elevated troponin secondary to demand ischemia.        Code Status:     Code Status Orders  (From admission, onward)           Start     Ordered   02/08/2021 1700  Full code  Continuous        01/28/2021 1659           Code Status History     Date Active Date Inactive Code Status Order ID Comments User Context   01/26/2019 1900 01/30/2019 1702 Full Code [id number]  Pearson Grippe, MD ED      Family Communication: Spoke with the patient's husband on the phone Disposition Plan: Status is: Inpatient  Dispo: The patient is from: Home              Anticipated d/c is to: Home versus rehab depending on clinical course              Patient currently being treated for Streptococcus agalactiae sepsis   Difficult to place patient.  No.  Consultants: Infectious disease Orthopedic surgery  Antibiotics: Ancef  Time spent: 27 minutes  [employer]

## 2021-02-07 NOTE — Consult Note (Signed)
Patient seen for evaluation of possible septic shoulder.  She has history of pneumonia and sepsis.  She has been having shoulder pain according to her son and husband and had difficulty getting her up out of bed.  She had been guarding the shoulder as well but she is very confused now and has difficulty describing her symptoms. On examination she can be passively flexed and abducted to 60 degrees with no evidence of wincing or pain abduction and external rotation there is a sensation of tenderness and popping at the biceps tendon but otherwise benign exam.  There is no effusion present and skin is intact CT of the chest for PE was evaluated and shows essentially normal shoulder with minimal degenerative change with normal x-ray done yesterday. Impression is shoulder pain of undetermined etiology possibly referred pain from the neck.  Less likely from the chest wall.  I will continue to follow her but less there is more physical findings I would not recommend aspirating the shoulder.

## 2021-02-07 NOTE — TOC Progression Note (Addendum)
Transition of Care [facility]) - Progression Note    Patient Details  Name: Amy Becker MRN: [id number] Date of Birth: 08-12-1950  Transition of Care [facility]) CM/SW Contact  Caryn Section, RN Phone Number: 02/07/2021, 3:53 PM  Clinical Narrative:   RNCM spoke with patient's spouse, who is at bedside.  Patient is typically independent at home, history of ETOH, has been to 2 rehabilitation centers for treatment of ETOH in the past.  Patient last used alcohol more than 5 days prior to her admit on 7/19.  Spouse states no concerns with transporting to appointments or getting her medications when patient is at home.    Spouse was recently discharged from the hospital with sepsis, states he is most concerned about resolving sepsis for his wife at this time.  Discussed SNF placement, spouse is amenable, would like [place], [place], [place], [place] stated that we cannot recommend facilities and that we can inquire about admission, but cannot guarantee bed offers.  Spouse understands and plans to review SNF bed offers when available.  Bed search started, TOC contact information given to spouse, TOC to follow to discharge.  Addendum:   RNCM Left message for Emiliano Dyer, awaiting response.      Expected Discharge Plan and Services                                                 Social Determinants of Health (SDOH) Interventions    Readmission Risk Interventions No flowsheet data found.

## 2021-02-07 NOTE — Progress Notes (Signed)
   Date of Admission:  02/07/2021     ID: Amy Becker is a 70 y.o. female Principal Problem:   CAP (community acquired pneumonia) Active Problems:   Hyponatremia   ETOH abuse   Tobacco abuse   Severe sepsis (HCC)   Hypokalemia   Hypertension   Visual floaters   Diarrhea   Thrombocytopenia (HCC)   Depression with anxiety   Joint pain   Elevated troponin   HLD (hyperlipidemia)   Acute metabolic encephalopathy   Alcohol withdrawal syndrome, with delirium (HCC)   Streptococcus agalactiae infection   Acute respiratory failure with hypoxia (HCC)    Subjective: Patient has received Ativan for withdrawal symptoms and is sleeping  Medications:   atorvastatin  20 mg Oral Daily   bethanechol  10 mg Oral TID   cyclobenzaprine  5 mg Oral QHS   diclofenac  75 mg Oral Q breakfast   folic acid  1 mg Oral Daily   hydrOXYzine  25 mg Oral TID   lidocaine  1 patch Transdermal Q24H   lisinopril  40 mg Oral Daily   metoprolol succinate  25 mg Oral Daily   mirtazapine  45 mg Oral QHS   nicotine  21 mg Transdermal Daily   pantoprazole  20 mg Oral Daily   potassium chloride  40 mEq Oral BID   sertraline  150 mg Oral Daily   thiamine  100 mg Oral Daily   traZODone  100 mg Oral QHS    Objective: Vital signs in last 24 hours: Temp:  [97.8 F (36.6 C)-98.4 F (36.9 C)] 97.8 F (36.6 C) (07/22 1608) Pulse Rate:  [83-102] 90 (07/22 1608) Resp:  [17-20] 20 (07/22 1608) BP: (157-184)/(75-95) 166/77 (07/22 1608) SpO2:  [84 %-92 %] 89 % (07/22 1608)  PHYSICAL EXAM:  General: Somnolent with Ativan Head: Normocephalic, without obvious abnormality, atraumatic. Lungs: Bilateral air entry Heart: Regular rate and rhythm, no murmur, rub or gallop. Abdomen: Soft, non-tender,not distended. Bowel sounds normal. No masses Extremities: Right thumb swollen and tender Arthritic changes in both hands Skin: Easy bruising and tearing of skin Lymph: Cervical, supraclavicular normal. Neurologic:  Grossly non-focal  Lab Results Recent Labs    02/05/21 1143 02/07/21 0418  WBC 15.9* 31.6*  HGB 12.7 13.2  HCT 34.3* 36.2  NA 133* 142  K 3.4* 2.9*  CL 104 116*  CO2 18* 17*  BUN 21 21  CREATININE 0.56 0.61   Liver Panel Recent Labs    02/05/21 1143  PROT 5.0*  ALBUMIN 2.1*  AST 36  ALT 12  ALKPHOS 76  BILITOT 1.5*   Sedimentation Rate Recent Labs    02/07/21 0418  ESRSEDRATE 67*   C-Reactive Protein Recent Labs    02/07/21 0418  CRP 40.8*    Microbiology: 01/27/2021 GBS in [facility] 02/06/21 BC NG 01/22/2021 UC- GBS    Assessment/Plan:  GB strep bacteremia with Group B strep uti Pt has intermittent urinary rentention with residue of 582- this could  cause wbc to go up She is getting I/O Continue cefazolin  Rt shoulder arthritis/pain Rt thumb arthritis and swelling R/o acute GOUTY arthritis  - cannot rely on Uric acid which can be low or normal in Gout- CRP is very high/ESR as well Watch closely for septic arthritis  Alcohol excess with withdrawal- on ativan  Cough with infiltrate- improved-  Discussed the management with the care team. ID will follow her peripherally this weekend call if needed.

## 2021-02-07 NOTE — Evaluation (Addendum)
Physical Therapy Evaluation Patient Details Name: Amy Becker MRN: [id number] DOB: 1950-09-19 Today's Date: 02/07/2021   History of Present Illness  Pt is a 70 y.o. F arriving to ED with c/o R shoulder pain and admitted for  pneumonia & strep agalactiae sepsis. PMH of alcohol abuse, HTN, Hyponatremia.  Clinical Impression  Pt asleep in bed with son present. Verbal and tactile cues required to awaken. Pt's son provided majority pt history stating pt was independent with mobility w/ 4-RW.  Pt requires mod-assist for bed mobility and transfers. Pt is able to initiate tasks, but requires support for momentum. Static sitting requires min-assist initially until upright tolerance is achieved, but then min-guard for safety. Pt experienced lightheadedness with standing BP: 179/110. Symptoms dissipated once sitting. spO2 monitored intermittently, ranging from 88-93% on 6L. Due to the change in PLOF Skilled PT intervention is indicated to address deficits in function, mobility, and to return to PLOF as able.  Discharge recommendations are SNF.    Follow Up Recommendations SNF;Supervision for mobility/OOB    Equipment Recommendations   (TBD next venue of care)    Recommendations for Other Services OT consult     Precautions / Restrictions Precautions Precautions: Fall Restrictions Weight Bearing Restrictions: No      Mobility  Bed Mobility Overal bed mobility: Needs Assistance Bed Mobility: Supine to Sit     Supine to sit: Mod assist     General bed mobility comments: Pt able to reach for rails, abd/add LE, assistance necessary for momentum and trunk support    Transfers Overall transfer level: Needs assistance Equipment used: Rolling walker (2 wheeled) Transfers: Sit to/from Stand Sit to Stand: Mod assist         General transfer comment: mod assist for task initiation, static standing requies min-assist  Ambulation/Gait                Stairs             Wheelchair Mobility    Modified Rankin (Stroke Patients Only)       Balance Overall balance assessment: Needs assistance Sitting-balance support: Bilateral upper extremity supported Sitting balance-Leahy Scale: Fair Sitting balance - Comments: Pt required support for sitting initially but was able to maintain static sitting without LOB > 2 min   Standing balance support: Bilateral upper extremity supported Standing balance-Leahy Scale: Poor Standing balance comment: Requires BUE support and min-assist, able to stand 60 second before sx of lightheadedness                             Pertinent Vitals/Pain Pain Assessment: Faces Faces Pain Scale: Hurts a little bit Pain Location: R arm Pain Descriptors / Indicators: Grimacing Pain Intervention(s): Limited activity within patient's tolerance;Monitored during session;Repositioned    Home Living Family/patient expects to be discharged to:: Private residence Living Arrangements: Spouse/significant other;Children Available Help at Discharge: Family (son) Type of Home: House Home Access: Stairs to enter Entrance Stairs-Rails: Can reach both Entrance Stairs-Number of Steps: 3 Home Layout: Two level Home Equipment: Walker - 4 wheels;Grab bars - tub/shower      Prior Function Level of Independence: Independent with assistive device(s)         Comments: amb with 4-RW and able to descend/ascend stairs,     Hand Dominance        Extremity/Trunk Assessment   Upper Extremity Assessment Upper Extremity Assessment: Generalized weakness;RUE deficits/detail;LUE deficits/detail RUE Deficits / Details: Decreased grip strength &  fine motor ability; Decreased shoulder, elbow, wrist AROM. LUE Deficits / Details: Greater grip strength compared to RUE, able to move UE against gravity    Lower Extremity Assessment Lower Extremity Assessment: Generalized weakness;LLE deficits/detail;RLE deficits/detail RLE Deficits /  Details: Able to lift leg against gravity for knee extension, knee flexion RLE Sensation: WNL LLE Deficits / Details: Able to lift leg against gravity for knee extension, knee flexion LLE Sensation: WNL    Cervical / Trunk Assessment Cervical / Trunk Assessment: Kyphotic  Communication   Communication: HOH;Expressive difficulties  Cognition Arousal/Alertness: Awake/alert;Lethargic Behavior During Therapy: WFL for tasks assessed/performed Overall Cognitive Status: Within Functional Limits for tasks assessed                                 General Comments: AOx3 name, birthdate, location; conversation limited due to hearing impairment and challenges with speech, history obtain from pt's son Aneta Mins;      General Comments General comments (skin integrity, edema, etc.): Dizziness with standing 179/110    Exercises Other Exercises Other Exercises: Seated KE, KF, ankle pumps x 5/each leg   Assessment/Plan    PT Assessment Patient needs continued PT services  PT Problem List Decreased strength;Decreased range of motion;Decreased activity tolerance;Decreased balance;Decreased mobility       PT Treatment Interventions Gait training;Stair training;Functional mobility training;Therapeutic activities;Therapeutic exercise;Neuromuscular re-education    PT Goals (Current goals can be found in the Care Plan section)  Acute Rehab PT Goals Patient Stated Goal: to feel better PT Goal Formulation: With patient/family Time For Goal Achievement: 02/21/21 Potential to Achieve Goals: Fair    Frequency Min 2X/week   Barriers to discharge        Co-evaluation               AM-PAC PT "6 Clicks" Mobility  Outcome Measure Help needed turning from your back to your side while in a flat bed without using bedrails?: A Lot Help needed moving from lying on your back to sitting on the side of a flat bed without using bedrails?: A Lot Help needed moving to and from a bed to a  chair (including a wheelchair)?: A Lot Help needed standing up from a chair using your arms (e.g., wheelchair or bedside chair)?: A Lot Help needed to walk in hospital room?: Total Help needed climbing 3-5 steps with a railing? : Total 6 Click Score: 10    End of Session Equipment Utilized During Treatment: Gait belt Activity Tolerance: Patient tolerated treatment well;Patient limited by fatigue Patient left: in bed;with bed alarm set;with family/visitor present   PT Visit Diagnosis: Other abnormalities of gait and mobility (R26.89);Muscle weakness (generalized) (M62.81);History of falling (Z91.81)    Time: 8466-5993 PT Time Calculation (min) (ACUTE ONLY): 36 min   Charges:             [employer], SPT

## 2021-02-07 NOTE — NC FL2 (Signed)
[place] MEDICAID FL2 LEVEL OF CARE SCREENING TOOL     IDENTIFICATION  Patient Name: Amy Becker Birthdate: [birth year] Sex: female Admission Date (Current Location): 02/08/2021  [facility] and [place] Number:  Chiropodist and Address:  [facility], [address] [place], [place] [address]      Provider Number: [id number]  Attending Physician Name and Address:  Alford Highland, MD  Relative Name and Phone Number:  BIANNCA, SCANTLIN (Spouse)   [phone] [facility])    Current Level of Care: Hospital Recommended Level of Care: Skilled Nursing Facility Prior Approval Number:    Date Approved/Denied:   PASRR Number: Must ID number, [id number]  PASRR Pending  Discharge Plan: SNF    Current Diagnoses: Patient Active Problem List   Diagnosis Date Noted   Acute metabolic encephalopathy    Alcohol withdrawal syndrome, with delirium (HCC)    CAP (community acquired pneumonia) 01/28/2021   Severe sepsis (HCC) 01/26/2021   Hypokalemia 02/03/2021   HLD (hyperlipidemia) 02/03/2021   Hypertension    Visual floaters    Diarrhea    Thrombocytopenia (HCC)    Depression with anxiety    Joint pain    Elevated troponin    Hyponatremia 01/26/2019   ETOH abuse 01/26/2019   Tobacco abuse 01/26/2019    Orientation RESPIRATION BLADDER Height & Weight     Self  O2 (3l nasal) Incontinent, External catheter Weight: 61.2 kg Height:  5\' 1"  (154.9 cm)  BEHAVIORAL SYMPTOMS/MOOD NEUROLOGICAL BOWEL NUTRITION STATUS     (Confused, irritable) Continent Diet (Heart Healthy)  AMBULATORY STATUS COMMUNICATION OF NEEDS Skin   Extensive Assist Verbally Skin abrasions (Bilateral legs, scattered)                       Personal Care Assistance Level of Assistance  Bathing, Feeding, Dressing Bathing Assistance: Limited assistance Feeding assistance: Limited assistance Dressing Assistance: Maximum assistance     Functional Limitations Info  Sight, Hearing,  Speech Sight Info: Impaired Hearing Info: Impaired Speech Info: Impaired    SPECIAL CARE FACTORS FREQUENCY  PT (By licensed PT), OT (By licensed OT)     PT Frequency: 5x weekly OT Frequency: 5x weekly            Contractures Contractures Info: Not present    Additional Factors Info  Code Status, Allergies Code Status Info: FULL Allergies Info: Hydromorphone, Cefaclor, Tetracycline           Current Medications (02/07/2021):  This is the current hospital active medication list Current Facility-Administered Medications  Medication Dose Route Frequency Provider Last Rate Last Admin   0.9 % NaCl with KCl 20 mEq/ L  infusion   Intravenous Continuous 01/29/2021, MD 50 mL/hr at 02/07/21 0652 New Bag at 02/07/21 01/18/2021   acetaminophen (TYLENOL) tablet 650 mg  650 mg Oral Q6H PRN 0175, MD       albuterol (PROVENTIL) (2.5 MG/3ML) 0.083% nebulizer solution 2.5 mg  2.5 mg Nebulization Q4H PRN Lorretta Harp, MD       aspirin EC tablet 81 mg  81 mg Oral Daily Lorretta Harp, MD   81 mg at 02/07/21 01/24/2021   atorvastatin (LIPITOR) tablet 20 mg  20 mg Oral Daily 1025, MD   20 mg at 02/07/21 01/23/2021   calcium carbonate (TUMS - dosed in mg elemental calcium) chewable tablet 400 mg of elemental calcium  2 tablet Oral TID PRN 8527, MD       ceFAZolin (ANCEF)  IVPB 2g/100 mL premix  2 g Intravenous Q8H Ravishankar, Jayashree, MD 200 mL/hr at 02/07/21 1440 2 g at 02/07/21 1440   colchicine tablet 0.6 mg  0.6 mg Oral Daily Alford Highland, MD   0.6 mg at 02/07/21 1194   cyclobenzaprine (FLEXERIL) tablet 5 mg  5 mg Oral QHS Wieting, Richard, MD   5 mg at 02/06/21 2125   dextromethorphan-guaiFENesin (MUCINEX DM) 30-600 MG per 12 hr tablet 1 tablet  1 tablet Oral BID PRN Lorretta Harp, MD   1 tablet at 02/05/21 1609   diclofenac (VOLTAREN) EC tablet 75 mg  75 mg Oral Q breakfast Alford Highland, MD   75 mg at 02/07/21 1740   folic acid (FOLVITE) tablet 1 mg  1 mg Oral Daily Lorretta Harp, MD   1 mg  at 02/07/21 8144   haloperidol lactate (HALDOL) injection 1 mg  1 mg Intravenous Q6H PRN Alford Highland, MD       hydrALAZINE (APRESOLINE) injection 5 mg  5 mg Intravenous Q2H PRN Lorretta Harp, MD   5 mg at 02/07/21 1132   hydrOXYzine (ATARAX/VISTARIL) tablet 25 mg  25 mg Oral TID Lorretta Harp, MD   25 mg at 02/07/21 0833   lidocaine (LIDODERM) 5 % 1 patch  1 patch Transdermal Q24H Lorretta Harp, MD   1 patch at 02/07/21 1510   lisinopril (ZESTRIL) tablet 40 mg  40 mg Oral Daily Lorretta Harp, MD   40 mg at 02/07/21 8185   LORazepam (ATIVAN) tablet 1-4 mg  1-4 mg Oral Q1H PRN Alford Highland, MD       Or   LORazepam (ATIVAN) injection 1-4 mg  1-4 mg Intravenous Q1H PRN Alford Highland, MD   2 mg at 02/07/21 0647   LORazepam (ATIVAN) tablet 1 mg  1 mg Oral Daily PRN Lorretta Harp, MD   1 mg at 02/06/21 0940   metoprolol succinate (TOPROL-XL) 24 hr tablet 25 mg  25 mg Oral Daily Lorretta Harp, MD   25 mg at 02/07/21 6314   mirtazapine (REMERON) tablet 45 mg  45 mg Oral QHS Lorretta Harp, MD   45 mg at 02/06/21 2125   nicotine (NICODERM CQ - dosed in mg/24 hours) patch 21 mg  21 mg Transdermal Daily Lorretta Harp, MD   21 mg at 02/07/21 0842   ondansetron (ZOFRAN) injection 4 mg  4 mg Intravenous Q8H PRN Lorretta Harp, MD       oxyCODONE-acetaminophen (PERCOCET/ROXICET) 5-325 MG per tablet 1 tablet  1 tablet Oral Q4H PRN Lorretta Harp, MD   1 tablet at 02/06/21 0555   pantoprazole (PROTONIX) EC tablet 20 mg  20 mg Oral Daily Lorretta Harp, MD   20 mg at 02/07/21 0843   potassium chloride SA (KLOR-CON) CR tablet 40 mEq  40 mEq Oral BID Alford Highland, MD   40 mEq at 02/07/21 9702   sertraline (ZOLOFT) tablet 150 mg  150 mg Oral Daily Lorretta Harp, MD   150 mg at 02/07/21 6378   thiamine tablet 100 mg  100 mg Oral Daily Alford Highland, MD   100 mg at 02/07/21 5885   traZODone (DESYREL) tablet 100 mg  100 mg Oral Marice Potter, MD   100 mg at 02/06/21 2125     Discharge Medications: Please see discharge summary for a list  of discharge medications.  Relevant Imaging Results:  Relevant Lab Results:   Additional Information SSN [id number]  Caryn Section, RN

## 2021-02-08 ENCOUNTER — Inpatient Hospital Stay: Payer: Self-pay

## 2021-02-08 ENCOUNTER — Inpatient Hospital Stay: Payer: Medicare Other

## 2021-02-08 DIAGNOSIS — D696 Thrombocytopenia, unspecified: Secondary | ICD-10-CM | POA: Diagnosis not present

## 2021-02-08 DIAGNOSIS — J9601 Acute respiratory failure with hypoxia: Secondary | ICD-10-CM | POA: Diagnosis not present

## 2021-02-08 DIAGNOSIS — J189 Pneumonia, unspecified organism: Secondary | ICD-10-CM | POA: Diagnosis not present

## 2021-02-08 DIAGNOSIS — A419 Sepsis, unspecified organism: Secondary | ICD-10-CM | POA: Diagnosis not present

## 2021-02-08 LAB — CBC WITH DIFFERENTIAL/PLATELET
Abs Immature Granulocytes: 0.93 10*3/uL — ABNORMAL HIGH (ref 0.00–0.07)
Basophils Absolute: 0.2 10*3/uL — ABNORMAL HIGH (ref 0.0–0.1)
Basophils Relative: 1 %
Eosinophils Absolute: 0 10*3/uL (ref 0.0–0.5)
Eosinophils Relative: 0 %
HCT: 38.8 % (ref 36.0–46.0)
Hemoglobin: 14.2 g/dL (ref 12.0–15.0)
Immature Granulocytes: 3 %
Lymphocytes Relative: 2 %
Lymphs Abs: 0.7 10*3/uL (ref 0.7–4.0)
MCH: 31.8 pg (ref 26.0–34.0)
MCHC: 36.6 g/dL — ABNORMAL HIGH (ref 30.0–36.0)
MCV: 86.8 fL (ref 80.0–100.0)
Monocytes Absolute: 2.4 10*3/uL — ABNORMAL HIGH (ref 0.1–1.0)
Monocytes Relative: 7 %
Neutro Abs: 31.6 10*3/uL — ABNORMAL HIGH (ref 1.7–7.7)
Neutrophils Relative %: 87 %
Platelets: 165 10*3/uL (ref 150–400)
RBC: 4.47 MIL/uL (ref 3.87–5.11)
RDW: 13.7 % (ref 11.5–15.5)
Smear Review: NORMAL
WBC: 35.9 10*3/uL — ABNORMAL HIGH (ref 4.0–10.5)
nRBC: 0 % (ref 0.0–0.2)

## 2021-02-08 LAB — CULTURE, RESPIRATORY W GRAM STAIN: Culture: NORMAL

## 2021-02-08 LAB — BASIC METABOLIC PANEL
Anion gap: 12 (ref 5–15)
BUN: 16 mg/dL (ref 8–23)
CO2: 21 mmol/L — ABNORMAL LOW (ref 22–32)
Calcium: 9.2 mg/dL (ref 8.9–10.3)
Chloride: 113 mmol/L — ABNORMAL HIGH (ref 98–111)
Creatinine, Ser: 0.57 mg/dL (ref 0.44–1.00)
GFR, Estimated: >60 mL/min (ref >60)
Glucose, Bld: 111 mg/dL — ABNORMAL HIGH (ref 70–99)
Potassium: 2.9 mmol/L — ABNORMAL LOW (ref 3.5–5.1)
Sodium: 146 mmol/L — ABNORMAL HIGH (ref 135–145)

## 2021-02-08 LAB — MRSA NEXT GEN BY PCR, NASAL: MRSA by PCR Next Gen: NOT DETECTED

## 2021-02-08 MED ORDER — POTASSIUM CHLORIDE 10 MEQ/100ML IV SOLN
10.0000 meq | INTRAVENOUS | Status: AC
Start: 2021-02-08 — End: 2021-02-08
  Administered 2021-02-08 (×4): 10 meq via INTRAVENOUS
  Filled 2021-02-08 (×4): qty 100

## 2021-02-08 MED ORDER — SODIUM CHLORIDE 0.9 % IV SOLN
2.0000 g | INTRAVENOUS | Status: DC
  Administered 2021-02-08: 2 g via INTRAVENOUS
  Filled 2021-02-08: qty 20
  Filled 2021-02-08: qty 2

## 2021-02-08 MED ORDER — METRONIDAZOLE 500 MG/100ML IV SOLN
500.0000 mg | Freq: Three times a day (TID) | INTRAVENOUS | Status: DC
  Administered 2021-02-08 (×2): 500 mg via INTRAVENOUS
  Filled 2021-02-08 (×6): qty 100

## 2021-02-08 MED ORDER — POTASSIUM CHLORIDE IN NACL 20-0.9 MEQ/L-% IV SOLN
INTRAVENOUS | Status: DC
  Filled 2021-02-08 (×2): qty 1000

## 2021-02-08 MED ORDER — IPRATROPIUM-ALBUTEROL 0.5-2.5 (3) MG/3ML IN SOLN
3.0000 mL | Freq: Four times a day (QID) | RESPIRATORY_TRACT | Status: DC
  Administered 2021-02-08 – 2021-02-10 (×8): 3 mL via RESPIRATORY_TRACT
  Filled 2021-02-08 (×8): qty 3

## 2021-02-08 MED ORDER — LORAZEPAM 2 MG/ML IJ SOLN
0.5000 mg | Freq: Once | INTRAMUSCULAR | Status: AC | PRN
  Administered 2021-02-09: 01:00:00 0.5 mg via INTRAVENOUS
  Filled 2021-02-08: qty 1

## 2021-02-08 MED ORDER — BUDESONIDE 0.5 MG/2ML IN SUSP
0.5000 mg | Freq: Two times a day (BID) | RESPIRATORY_TRACT | Status: DC
  Administered 2021-02-08 – 2021-02-09 (×2): 0.5 mg via RESPIRATORY_TRACT
  Filled 2021-02-08 (×2): qty 2

## 2021-02-08 MED ORDER — POTASSIUM CHLORIDE CRYS ER 20 MEQ PO TBCR
40.0000 meq | EXTENDED_RELEASE_TABLET | Freq: Three times a day (TID) | ORAL | Status: DC
  Filled 2021-02-08: qty 2

## 2021-02-08 MED ORDER — SODIUM CHLORIDE 0.9 % IV SOLN
500.0000 mg | INTRAVENOUS | Status: DC
  Administered 2021-02-08: 12:00:00 500 mg via INTRAVENOUS
  Filled 2021-02-08 (×2): qty 500

## 2021-02-08 NOTE — Progress Notes (Signed)
Spoke with Irving Burton RN re PICC order.  States has 1 PIV working well for current IV needs.  Notified plan to place PICC later today.

## 2021-02-08 NOTE — Progress Notes (Signed)
Patient would not take PO evening medications and with altered mental status she is not safe to take PO at this time. Will follow SLP recommendations to keep NPO. Have reached out to MD to change some medications to IV until her mental status improves. -nothing follows

## 2021-02-08 NOTE — Progress Notes (Signed)
SLP Cancellation Note  Patient Details Name: Amy Becker MRN: [id number] DOB: [birth year]   Cancelled treatment:       Reason Eval/Treat Not Completed: Patient's level of consciousness. Attempted swallow evaluation. Patient disoriented and confused, needing frequent repositioning trying to slide out of bed. Not following commands, attempted oral care and pt refused, unable to hold head upright or maintain midline position. Has congested cough. Nurse tech reports pt has not been taking POs with exception of liquids, and was noted to be coughing with these. Recommend NPO until mental status improves; SLP to f/u Monday for swallow assessment.   Rondel Baton, [place], CCC-SLP Speech-Language Pathologist    Arlana Lindau 02/08/2021, 10:32 AM

## 2021-02-08 NOTE — Progress Notes (Signed)
Patient ID: Amy Becker, female   DOB: August 03, 1950, 70 y.o.   MRN: [id number] [facility] PROGRESS NOTE  AMIYRAH LAMERE HYI:[id number] DOB: 1951-02-01 DOA: 02-15-2021 PCP: Orson Aloe, MD  HPI/Subjective: Patient requiring more oxygen and needed to be placed on high flow nasal cannula.  Patient still with some confusion.  White count even more elevated.  Repeat chest x-ray showing bilateral pneumonia.  Objective: Vitals:   02/08/21 1232 02/08/21 1509  BP: (!) 185/95 (!) 174/88  Pulse: 91 90  Resp: 20 (!) 22  Temp: 98.2 F (36.8 C) 98.5 F (36.9 C)  SpO2: 95% 100%    Intake/Output Summary (Last 24 hours) at 02/08/2021 1545 Last data filed at 02/08/2021 1300 Gross per 24 hour  Intake 2138.09 ml  Output 3950 ml  Net -1811.91 ml   Filed Weights   15-Feb-2021 1043  Weight: 61.2 kg    ROS: Review of Systems  Unable to perform ROS: Acuity of condition  Exam: Physical Exam HENT:     Head: Normocephalic.     Mouth/Throat:     Pharynx: No oropharyngeal exudate.  Eyes:     General: Lids are normal.     Conjunctiva/sclera: Conjunctivae normal.  Cardiovascular:     Rate and Rhythm: Normal rate and regular rhythm.     Heart sounds: Normal heart sounds, S1 normal and S2 normal.  Pulmonary:     Breath sounds: Examination of the right-middle field reveals decreased breath sounds. Examination of the left-middle field reveals decreased breath sounds. Examination of the right-lower field reveals decreased breath sounds and rhonchi. Examination of the left-lower field reveals decreased breath sounds and rhonchi. Decreased breath sounds and rhonchi present. No wheezing or rales.  Abdominal:     Palpations: Abdomen is soft.     Tenderness: There is no abdominal tenderness.  Musculoskeletal:     Right lower leg: No swelling.     Left lower leg: No swelling.  Skin:    General: Skin is warm.     Comments: Bruising arms  Neurological:     Mental Status: She is disoriented.      Data Reviewed: Basic Metabolic Panel: Recent Labs  Lab 2021/02/15 1440 2021-02-15 1759 02/05/21 0436 02/05/21 1143 02/07/21 0418 02/07/21 2118 02/08/21 0823  NA 130*  --  132* 133* 142  --  146*  K 2.9*  --  3.8 3.4* 2.9* 2.9* 2.9*  CL 97*  --  99 104 116*  --  113*  CO2 18*  --  18* 18* 17*  --  21*  GLUCOSE 80  --  73 104* 103*  --  111*  BUN 20  --  20 21 21   --  16  CREATININE 0.66  --  0.54 0.56 0.61  --  0.57  CALCIUM 8.8*  --  8.6* 8.5* 8.7*  --  9.2  MG 1.7  --  2.2 1.9 2.0  --   --   PHOS  --  3.7  --  3.4  --   --   --    Liver Function Tests: Recent Labs  Lab Feb 15, 2021 1440 02/05/21 1143  AST 34 36  ALT 11 12  ALKPHOS 101 76  BILITOT 1.9* 1.5*  PROT 6.0* 5.0*  ALBUMIN 2.6* 2.1*   CBC: Recent Labs  Lab 02-15-21 1045 02/05/21 0436 02/05/21 1143 02/07/21 0418 02/08/21 0823  WBC 15.0* 20.3* 15.9* 31.6* 35.9*  NEUTROABS  --   --   --   --  31.6*  HGB 15.9* 13.6 12.7 13.2 14.2  HCT 44.6 36.5 34.3* 36.2 38.8  MCV 89.6 87.1 86.8 85.2 86.8  PLT 53* 49* 51* 124* 165     Recent Results (from the past 240 hour(s))  Urine Culture     Status: Abnormal   Collection Time: 01/25/2021 10:45 AM   Specimen: Urine, Clean Catch  Result Value Ref Range Status   Specimen Description   Final    URINE, CLEAN CATCH Performed at [facility], [address] [place], [place] [address]    Special Requests   Final    NONE Performed at [facility], [address], [place], [place] [address]    Culture (A)  Final    50,000 COLONIES/mL GROUP B STREP(S.AGALACTIAE)ISOLATED TESTING AGAINST S. AGALACTIAE NOT ROUTINELY PERFORMED DUE TO PREDICTABILITY OF AMP/PEN/VAN SUSCEPTIBILITY. Performed at [facility], [address], [place], [place] [address]    Report Status 02/06/2021 FINAL  Final  Resp Panel by RT-PCR (Flu A&B, Covid) Nasopharyngeal Swab     Status: None   Collection Time: 01/30/2021  1:37 PM   Specimen: Nasopharyngeal Swab;  Nasopharyngeal(NP) swabs in vial transport medium  Result Value Ref Range Status   SARS Coronavirus 2 by RT PCR NEGATIVE NEGATIVE Final    Comment: (NOTE) SARS-CoV-2 target nucleic acids are NOT DETECTED.  The SARS-CoV-2 RNA is generally detectable in upper respiratory specimens during the acute phase of infection. The lowest concentration of SARS-CoV-2 viral copies this assay can detect is 138 copies/mL. A negative result does not preclude SARS-Cov-2 infection and should not be used as the sole basis for treatment or other patient management decisions. A negative result may occur with  improper specimen collection/handling, submission of specimen other than nasopharyngeal swab, presence of viral mutation(s) within the areas targeted by this assay, and inadequate number of viral copies(<138 copies/mL). A negative result must be combined with clinical observations, patient history, and epidemiological information. The expected result is Negative.  Fact Sheet for Patients:  BloggerCourse.com  Fact Sheet for Healthcare Providers:  SeriousBroker.it  This test is no t yet approved or cleared by the [place] FDA and  has been authorized for detection and/or diagnosis of SARS-CoV-2 by FDA under an Emergency Use Authorization (EUA). This EUA will remain  in effect (meaning this test can be used) for the duration of the COVID-19 declaration under Section 564(b)(1) of the Act, 21 U.S.C.section 360bbb-3(b)(1), unless the authorization is terminated  or revoked sooner.       Influenza A by PCR NEGATIVE NEGATIVE Final   Influenza B by PCR NEGATIVE NEGATIVE Final    Comment: (NOTE) The Xpert Xpress SARS-CoV-2/FLU/RSV plus assay is intended as an aid in the diagnosis of influenza from Nasopharyngeal swab specimens and should not be used as a sole basis for treatment. Nasal washings and aspirates are unacceptable for Xpert Xpress  SARS-CoV-2/FLU/RSV testing.  Fact Sheet for Patients: BloggerCourse.com  Fact Sheet for Healthcare Providers: SeriousBroker.it  This test is not yet approved or cleared by the [place] FDA and has been authorized for detection and/or diagnosis of SARS-CoV-2 by FDA under an Emergency Use Authorization (EUA). This EUA will remain in effect (meaning this test can be used) for the duration of the COVID-19 declaration under Section 564(b)(1) of the Act, 21 U.S.C. section 360bbb-3(b)(1), unless the authorization is terminated or revoked.  Performed at [facility], [address], [place], [place] [address]   Blood culture (single)  Status: Abnormal   Collection Time: 02/03/2021  3:59 PM   Specimen: BLOOD  Result Value Ref Range Status   Specimen Description   Final    BLOOD LEFT ANTECUBITAL Performed at [facility], [address] [place], [place] [address]    Special Requests   Final    BOTTLES DRAWN AEROBIC AND ANAEROBIC Blood Culture results may not be optimal due to an inadequate volume of blood received in culture bottles Performed at [facility], [address] [place], [place] [address]    Culture  Setup Time   Final    GRAM POSITIVE COCCI IN BOTH AEROBIC AND ANAEROBIC BOTTLES CRITICAL RESULT CALLED TO, READ BACK BY AND VERIFIED WITH: JASON ROBINS  ON 02/05/21 SKL Performed at [facility], [address] [place], [place] [address]    Culture GROUP B STREP(S.AGALACTIAE)ISOLATED (A)  Final   Report Status 02/07/2021 FINAL  Final   Organism ID, Bacteria GROUP B STREP(S.AGALACTIAE)ISOLATED  Final      Susceptibility   Group b strep(s.agalactiae)isolated - MIC*    CLINDAMYCIN <=0.25 SENSITIVE Sensitive     AMPICILLIN <=0.25 SENSITIVE Sensitive     ERYTHROMYCIN <=0.12 SENSITIVE Sensitive     VANCOMYCIN 0.5 SENSITIVE Sensitive     CEFTRIAXONE <=0.12 SENSITIVE Sensitive      LEVOFLOXACIN 1 SENSITIVE Sensitive     PENICILLIN Value in next row Sensitive      SENSITIVE0.06    * GROUP B STREP(S.AGALACTIAE)ISOLATED  Blood Culture ID Panel (Reflexed)     Status: Abnormal   Collection Time: 01/20/2021  3:59 PM  Result Value Ref Range Status   Enterococcus faecalis NOT DETECTED NOT DETECTED Final   Enterococcus Faecium NOT DETECTED NOT DETECTED Final   Listeria monocytogenes NOT DETECTED NOT DETECTED Final   Staphylococcus species NOT DETECTED NOT DETECTED Final   Staphylococcus aureus (BCID) NOT DETECTED NOT DETECTED Final   Staphylococcus epidermidis NOT DETECTED NOT DETECTED Final   Staphylococcus lugdunensis NOT DETECTED NOT DETECTED Final   Streptococcus species DETECTED (A) NOT DETECTED Final    Comment: CRITICAL RESULT CALLED TO, READ BACK BY AND VERIFIED WITH: JASON ROBINS  ON 02/05/21 SKL    Streptococcus agalactiae DETECTED (A) NOT DETECTED Final    Comment: CRITICAL RESULT CALLED TO, READ BACK BY AND VERIFIED WITH: JASON ROBINS  ON 02/05/21 SKL    Streptococcus pneumoniae NOT DETECTED NOT DETECTED Final   Streptococcus pyogenes NOT DETECTED NOT DETECTED Final   A.calcoaceticus-baumannii NOT DETECTED NOT DETECTED Final   Bacteroides fragilis NOT DETECTED NOT DETECTED Final   Enterobacterales NOT DETECTED NOT DETECTED Final   Enterobacter cloacae complex NOT DETECTED NOT DETECTED Final   Escherichia coli NOT DETECTED NOT DETECTED Final   Klebsiella aerogenes NOT DETECTED NOT DETECTED Final   Klebsiella oxytoca NOT DETECTED NOT DETECTED Final   Klebsiella pneumoniae NOT DETECTED NOT DETECTED Final   Proteus species NOT DETECTED NOT DETECTED Final   Salmonella species NOT DETECTED NOT DETECTED Final   Serratia marcescens NOT DETECTED NOT DETECTED Final   Haemophilus influenzae NOT DETECTED NOT DETECTED Final   Neisseria meningitidis NOT DETECTED NOT DETECTED Final   Pseudomonas aeruginosa NOT DETECTED NOT DETECTED Final   Stenotrophomonas  maltophilia NOT DETECTED NOT DETECTED Final   Candida albicans NOT DETECTED NOT DETECTED Final   Candida auris NOT DETECTED NOT DETECTED Final   Candida glabrata NOT DETECTED NOT DETECTED Final   Candida krusei NOT DETECTED NOT DETECTED Final   Candida parapsilosis NOT DETECTED NOT  DETECTED Final   Candida tropicalis NOT DETECTED NOT DETECTED Final   Cryptococcus neoformans/gattii NOT DETECTED NOT DETECTED Final    Comment: Performed at [facility], [address] [place], [place] [address]  Expectorated Sputum Assessment w Gram Stain, Rflx to Resp Cult     Status: None   Collection Time: 02/16/2021  7:15 PM   Specimen: SPU  Result Value Ref Range Status   Specimen Description SPUTUM  Final   Special Requests NONE  Final   Sputum evaluation   Final    THIS SPECIMEN IS ACCEPTABLE FOR SPUTUM CULTURE Performed at [facility], [address] [place], [place] [address]    Report Status 02/05/2021 FINAL  Final  Culture, Respiratory w Gram Stain     Status: None (Preliminary result)   Collection Time: 01/23/2021  7:15 PM   Specimen: SPU  Result Value Ref Range Status   Specimen Description   Final    SPUTUM Performed at [facility], [address] [place], [place] [address]    Special Requests   Final    NONE Reflexed from [id number] Performed at [facility], [address] [place], [place] [address]    Gram Stain   Final    FEW WBC PRESENT, PREDOMINANTLY MONONUCLEAR NO ORGANISMS SEEN    Culture   Final    CULTURE REINCUBATED FOR BETTER GROWTH Performed at [facility], [address], [place], [place] [address]    Report Status PENDING  Incomplete  CULTURE, BLOOD (ROUTINE X 2) w Reflex to ID Panel     Status: None (Preliminary result)   Collection Time: 02/06/21  1:00 AM   Specimen: BLOOD RIGHT HAND  Result Value Ref Range Status   Specimen Description BLOOD RIGHT HAND  Final   Special Requests   Final    BOTTLES DRAWN AEROBIC AND  ANAEROBIC Blood Culture adequate volume   Culture   Final    NO GROWTH 2 DAYS Performed at [facility], [address] [place], [place] [address]    Report Status PENDING  Incomplete  CULTURE, BLOOD (ROUTINE X 2) w Reflex to ID Panel     Status: None (Preliminary result)   Collection Time: 02/06/21  1:01 AM   Specimen: BLOOD LEFT HAND  Result Value Ref Range Status   Specimen Description BLOOD LEFT HAND  Final   Special Requests   Final    BOTTLES DRAWN AEROBIC AND ANAEROBIC Blood Culture results may not be optimal due to an inadequate volume of blood received in culture bottles   Culture   Final    NO GROWTH 2 DAYS Performed at [facility], [address], [place], [place] [address]    Report Status PENDING  Incomplete     Studies: DG Chest Port 1 View  Result Date: 02/08/2021 CLINICAL DATA:  Sepsis, wall with dural, hypoxia EXAM: PORTABLE CHEST 1 VIEW COMPARISON:  01/26/2019 chest radiograph. 01/23/2021 chest CT angiogram. FINDINGS: Stable cardiomediastinal silhouette with top-normal heart size. No pneumothorax. No pleural effusion. Extensive patchy opacity throughout the right greater than left lungs, largely new. IMPRESSION: Extensive patchy opacity throughout the right greater than left lungs, largely new, suspicious for multilobar pneumonia. Electronically Signed   By: Delbert Phenix M.D.   On: 02/08/2021 09:36   [place] EKG SITE RITE  Result Date: 02/08/2021 If Site Rite image not attached, placement could not be confirmed due to current cardiac rhythm.   Scheduled Meds:  budesonide (PULMICORT)  nebulizer solution  0.5 mg Nebulization BID   cyclobenzaprine  5 mg Oral QHS   diclofenac  75 mg Oral Q breakfast   folic acid  1 mg Oral Daily   hydrOXYzine  25 mg Oral TID   ipratropium-albuterol  3 mL Nebulization Q6H   lidocaine  1 patch Transdermal Q24H   lisinopril  40 mg Oral Daily   metoprolol succinate  25 mg Oral Daily   mirtazapine  45 mg Oral QHS    nicotine  21 mg Transdermal Daily   pantoprazole  20 mg Oral Daily   sertraline  150 mg Oral Daily   thiamine  100 mg Oral Daily   traZODone  100 mg Oral QHS   Continuous Infusions:  0.9 % NaCl with KCl 20 mEq / L 50 mL/hr at 02/08/21 1051   azithromycin 500 mg (02/08/21 1207)   cefTRIAXone (ROCEPHIN)  IV 2 g (02/08/21 1201)   metronidazole 500 mg (02/08/21 1500)   potassium chloride 10 mEq (02/08/21 1229)    Assessment/Plan:  Acute hypoxic respiratory failure.  Pulse ox 86% on 4 to 6 L this morning patient was placed on high flow nasal cannula 8 L this morning for increasing oxygen requirements. New bilateral pneumonia.  Case discussed with infectious disease specialist and changed antibiotics from Ancef over to Rocephin, Flagyl and Zithromax.  Poor IV access, PICC line ordered since blood cultures negative for 2 days.  Dysphagia diet with thickened liquids for now.  Will need a better mental status in order to eat. Severe sepsis with acute metabolic encephalopathy, tachycardia leukocytosis.  Present on admission.  Streptococcus agalactiae growing out of blood cultures.  Changed antibiotics from Ancef over to Rocephin.  Patient has had intermittent urinary catheterizations. Alcohol abuse with alcohol withdrawal.  Received high-dose thiamine during the hospital course.  Patient did receive Ativan this morning hopefully will not need much more of this medication. Joint and shoulder pain.  Appreciate orthopedic consultation Thrombocytopenia.  This has improved up to 165,000. Hypokalemia.  Potassium still low.  First I tried to give oral potassium but now will have to go back to IV supplementation. Hyponatremia on presentation.  Last sodium in the normal range. Floaters.  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 Depression anxiety on Zoloft and trazodone Hyperlipidemia.  Hold Lipitor Elevated troponin secondary to demand ischemia Confirmed  full CODE STATUS     Code Status:     Code Status Orders  (From admission, onward)           Start     Ordered   October 06, 2020 1700  Full code  Continuous        October 06, 2020 1659           Code Status History     Date Active Date Inactive Code Status Order ID Comments User Context   01/26/2019 1900 01/30/2019 1702 Full Code [id number]  Pearson GrippeKim, James, MD ED      Family Communication: Spoke with sister at the bedside.  Spoke with husband on the phone Disposition Plan: Status is: Inpatient  Dispo: The patient is from: Home              Anticipated d/c is to: Home              Patient currently with increasing oxygen requirements needs further hospitalization.   Difficult to place patient.  No.  Consultants: Infectious disease  Antibiotics: Rocephin, Zithromax, Flagyl  Time spent: 28 minutes  [place]  [facility] [employer]

## 2021-02-08 NOTE — Progress Notes (Signed)
Irving Burton RN notified PICC placement to be done in am of 01/27/2021.  States PIV working well at this time and agreeable to plan. States husband gave consent and no further questions to answer.

## 2021-02-09 ENCOUNTER — Inpatient Hospital Stay: Payer: Medicare Other

## 2021-02-09 ENCOUNTER — Other Ambulatory Visit: Payer: Self-pay

## 2021-02-09 ENCOUNTER — Inpatient Hospital Stay
Admit: 2021-02-09 | Discharge: 2021-02-09 | Disposition: A | Discharge status: Home / Self Care | Payer: Medicare Other | Attending: Cardiology | Admitting: Cardiology

## 2021-02-09 ENCOUNTER — Encounter: Admission: EM | Disposition: E | Payer: Self-pay | Source: Home / Self Care | Attending: Internal Medicine

## 2021-02-09 ENCOUNTER — Encounter: Payer: Self-pay | Admitting: Internal Medicine

## 2021-02-09 DIAGNOSIS — R579 Shock, unspecified: Secondary | ICD-10-CM | POA: Diagnosis not present

## 2021-02-09 DIAGNOSIS — A491 Streptococcal infection, unspecified site: Secondary | ICD-10-CM

## 2021-02-09 DIAGNOSIS — I639 Cerebral infarction, unspecified: Secondary | ICD-10-CM

## 2021-02-09 DIAGNOSIS — E870 Hyperosmolality and hypernatremia: Secondary | ICD-10-CM

## 2021-02-09 DIAGNOSIS — J9601 Acute respiratory failure with hypoxia: Secondary | ICD-10-CM | POA: Diagnosis not present

## 2021-02-09 DIAGNOSIS — I214 Non-ST elevation (NSTEMI) myocardial infarction: Secondary | ICD-10-CM | POA: Diagnosis not present

## 2021-02-09 DIAGNOSIS — D696 Thrombocytopenia, unspecified: Secondary | ICD-10-CM | POA: Diagnosis not present

## 2021-02-09 DIAGNOSIS — I2119 ST elevation (STEMI) myocardial infarction involving other coronary artery of inferior wall: Secondary | ICD-10-CM

## 2021-02-09 DIAGNOSIS — G9341 Metabolic encephalopathy: Secondary | ICD-10-CM | POA: Diagnosis not present

## 2021-02-09 DIAGNOSIS — J189 Pneumonia, unspecified organism: Secondary | ICD-10-CM | POA: Diagnosis not present

## 2021-02-09 LAB — LACTIC ACID, PLASMA
Lactic Acid, Venous: 6 mmol/L (ref 0.5–1.9)
Lactic Acid, Venous: 3.5 mmol/L (ref 0.5–1.9)
Lactic Acid, Venous: 2.9 mmol/L (ref 0.5–1.9)
Lactic Acid, Venous: 3.2 mmol/L (ref 0.5–1.9)

## 2021-02-09 LAB — ECHOCARDIOGRAM COMPLETE
AR max vel: 2.24 cm2
AV Area VTI: 2.08 cm2
AV Area mean vel: 1.92 cm2
AV Mean grad: 4 mmHg
AV Peak grad: 7.6 mmHg
Ao pk vel: 1.38 m/s
Area-P 1/2: 3.99 cm2
Calc EF: 50.5 %
Height: 61 in
P 1/2 time: 335 msec
S' Lateral: 2.11 cm
Single Plane A2C EF: 46.8 %
Single Plane A4C EF: 53.3 %
Weight: 2160 oz

## 2021-02-09 LAB — BLOOD GAS, ARTERIAL
Acid-base deficit: 7.8 mmol/L — ABNORMAL HIGH (ref 0.0–2.0)
Acid-base deficit: 6.7 mmol/L — ABNORMAL HIGH (ref 0.0–2.0)
Acid-base deficit: 11.3 mmol/L — ABNORMAL HIGH (ref 0.0–2.0)
Acid-base deficit: 15.5 mmol/L — ABNORMAL HIGH (ref 0.0–2.0)
Bicarbonate: 13.5 mmol/L — ABNORMAL LOW (ref 20.0–28.0)
Bicarbonate: 16.7 mmol/L — ABNORMAL LOW (ref 20.0–28.0)
Bicarbonate: 14.9 mmol/L — ABNORMAL LOW (ref 20.0–28.0)
Bicarbonate: 11.6 mmol/L — ABNORMAL LOW (ref 20.0–28.0)
FIO2: 1
FIO2: 0.35
FIO2: 0.35
MECHVT: 450 mL
MECHVT: 400 mL
MECHVT: 450 mL
O2 Saturation: 91.7 %
O2 Saturation: 99.8 %
O2 Saturation: 95.5 %
O2 Saturation: 96.7 %
PEEP: 5 cmH2O
PEEP: 5 cmH2O
PEEP: 5 cmH2O
Patient temperature: 37
Patient temperature: 37
Patient temperature: 37
Patient temperature: 37
RATE: 16 resp/min
RATE: 18 resp/min
RATE: 24 resp/min
pCO2 arterial: 19 mmHg — CL (ref 32.0–48.0)
pCO2 arterial: 27 mmHg — ABNORMAL LOW (ref 32.0–48.0)
pCO2 arterial: 34 mmHg (ref 32.0–48.0)
pCO2 arterial: 31 mmHg — ABNORMAL LOW (ref 32.0–48.0)
pH, Arterial: 7.46 — ABNORMAL HIGH (ref 7.350–7.450)
pH, Arterial: 7.4 (ref 7.350–7.450)
pH, Arterial: 7.25 — ABNORMAL LOW (ref 7.350–7.450)
pH, Arterial: 7.18 — CL (ref 7.350–7.450)
pO2, Arterial: 59 mmHg — ABNORMAL LOW (ref 83.0–108.0)
pO2, Arterial: 230 mmHg — ABNORMAL HIGH (ref 83.0–108.0)
pO2, Arterial: 91 mmHg (ref 83.0–108.0)
pO2, Arterial: 108 mmHg (ref 83.0–108.0)

## 2021-02-09 LAB — BASIC METABOLIC PANEL
Anion gap: 10 (ref 5–15)
BUN: 54 mg/dL — ABNORMAL HIGH (ref 8–23)
CO2: 17 mmol/L — ABNORMAL LOW (ref 22–32)
Calcium: 7.8 mg/dL — ABNORMAL LOW (ref 8.9–10.3)
Chloride: 116 mmol/L — ABNORMAL HIGH (ref 98–111)
Creatinine, Ser: 2.13 mg/dL — ABNORMAL HIGH (ref 0.44–1.00)
GFR, Estimated: 24 mL/min — ABNORMAL LOW (ref >60)
Glucose, Bld: 110 mg/dL — ABNORMAL HIGH (ref 70–99)
Potassium: 4.5 mmol/L (ref 3.5–5.1)
Sodium: 143 mmol/L (ref 135–145)

## 2021-02-09 LAB — CBC
HCT: 34.6 % — ABNORMAL LOW (ref 36.0–46.0)
Hemoglobin: 12.4 g/dL (ref 12.0–15.0)
MCH: 31.2 pg (ref 26.0–34.0)
MCHC: 35.8 g/dL (ref 30.0–36.0)
MCV: 87.2 fL (ref 80.0–100.0)
Platelets: 26 10*3/uL — CL (ref 150–400)
RBC: 3.97 MIL/uL (ref 3.87–5.11)
RDW: 15 % (ref 11.5–15.5)
WBC: 41.9 10*3/uL — ABNORMAL HIGH (ref 4.0–10.5)
nRBC: 0.2 % (ref 0.0–0.2)

## 2021-02-09 LAB — GLUCOSE, CAPILLARY
Glucose-Capillary: 110 mg/dL — ABNORMAL HIGH (ref 70–99)
Glucose-Capillary: 130 mg/dL — ABNORMAL HIGH (ref 70–99)
Glucose-Capillary: 206 mg/dL — ABNORMAL HIGH (ref 70–99)
Glucose-Capillary: 273 mg/dL — ABNORMAL HIGH (ref 70–99)
Glucose-Capillary: 36 mg/dL — CL (ref 70–99)
Glucose-Capillary: 36 mg/dL — CL (ref 70–99)
Glucose-Capillary: 88 mg/dL (ref 70–99)
Glucose-Capillary: 89 mg/dL (ref 70–99)
Glucose-Capillary: 93 mg/dL (ref 70–99)

## 2021-02-09 LAB — CBC WITH DIFFERENTIAL/PLATELET
Abs Immature Granulocytes: 1.31 10*3/uL — ABNORMAL HIGH (ref 0.00–0.07)
Basophils Absolute: 0.2 10*3/uL — ABNORMAL HIGH (ref 0.0–0.1)
Basophils Relative: 1 %
Eosinophils Absolute: 0 10*3/uL (ref 0.0–0.5)
Eosinophils Relative: 0 %
HCT: 36.4 % (ref 36.0–46.0)
Hemoglobin: 13.3 g/dL (ref 12.0–15.0)
Immature Granulocytes: 4 %
Lymphocytes Relative: 2 %
Lymphs Abs: 0.8 10*3/uL (ref 0.7–4.0)
MCH: 31.2 pg (ref 26.0–34.0)
MCHC: 36.5 g/dL — ABNORMAL HIGH (ref 30.0–36.0)
MCV: 85.4 fL (ref 80.0–100.0)
Monocytes Absolute: 1.5 10*3/uL — ABNORMAL HIGH (ref 0.1–1.0)
Monocytes Relative: 4 %
Neutro Abs: 30.2 10*3/uL — ABNORMAL HIGH (ref 1.7–7.7)
Neutrophils Relative %: 89 %
Platelets: UNDETERMINED 10*3/uL (ref 150–400)
RBC: 4.26 MIL/uL (ref 3.87–5.11)
RDW: 14.5 % (ref 11.5–15.5)
Smear Review: NORMAL
WBC: 34 10*3/uL — ABNORMAL HIGH (ref 4.0–10.5)
nRBC: 0.1 % (ref 0.0–0.2)

## 2021-02-09 LAB — TROPONIN I (HIGH SENSITIVITY)
Troponin I (High Sensitivity): 14715 ng/L (ref <18)
Troponin I (High Sensitivity): >25606 ng/L (ref <18)
Troponin I (High Sensitivity): >24000 ng/L (ref <18)
Troponin I (High Sensitivity): >24000 ng/L (ref <18)

## 2021-02-09 LAB — PROTIME-INR
INR: 2.8 — ABNORMAL HIGH (ref 0.8–1.2)
INR: 2.5 — ABNORMAL HIGH (ref 0.8–1.2)
Prothrombin Time: 29.2 seconds — ABNORMAL HIGH (ref 11.4–15.2)
Prothrombin Time: 27.3 seconds — ABNORMAL HIGH (ref 11.4–15.2)

## 2021-02-09 LAB — D-DIMER, QUANTITATIVE
D-Dimer, Quant: >20 ug/mL-FEU — ABNORMAL HIGH (ref 0.00–0.50)
D-Dimer, Quant: >20 ug/mL-FEU — ABNORMAL HIGH (ref 0.00–0.50)

## 2021-02-09 LAB — TYPE AND SCREEN
ABO/RH(D): A POS
Antibody Screen: NEGATIVE

## 2021-02-09 LAB — TECHNOLOGIST SMEAR REVIEW: Plt Morphology: DECREASED

## 2021-02-09 LAB — COMPREHENSIVE METABOLIC PANEL
ALT: 24 U/L (ref 0–44)
AST: 180 U/L — ABNORMAL HIGH (ref 15–41)
Albumin: 1.9 g/dL — ABNORMAL LOW (ref 3.5–5.0)
Alkaline Phosphatase: 192 U/L — ABNORMAL HIGH (ref 38–126)
Anion gap: 14 (ref 5–15)
BUN: 34 mg/dL — ABNORMAL HIGH (ref 8–23)
CO2: 16 mmol/L — ABNORMAL LOW (ref 22–32)
Calcium: 8.6 mg/dL — ABNORMAL LOW (ref 8.9–10.3)
Chloride: 118 mmol/L — ABNORMAL HIGH (ref 98–111)
Creatinine, Ser: 1.45 mg/dL — ABNORMAL HIGH (ref 0.44–1.00)
GFR, Estimated: 39 mL/min — ABNORMAL LOW (ref >60)
Glucose, Bld: 114 mg/dL — ABNORMAL HIGH (ref 70–99)
Potassium: 4.3 mmol/L (ref 3.5–5.1)
Sodium: 148 mmol/L — ABNORMAL HIGH (ref 135–145)
Total Bilirubin: 1.7 mg/dL — ABNORMAL HIGH (ref 0.3–1.2)
Total Protein: 5.2 g/dL — ABNORMAL LOW (ref 6.5–8.1)

## 2021-02-09 LAB — LIPID PANEL
Cholesterol: 53 mg/dL (ref 0–200)
HDL: 12 mg/dL — ABNORMAL LOW (ref >40)
LDL Cholesterol: 14 mg/dL (ref 0–99)
Total CHOL/HDL Ratio: 4.4 RATIO
Triglycerides: 137 mg/dL (ref <150)
VLDL: 27 mg/dL (ref 0–40)

## 2021-02-09 LAB — APTT
aPTT: 32 seconds (ref 24–36)
aPTT: 47 seconds — ABNORMAL HIGH (ref 24–36)

## 2021-02-09 LAB — ABO/RH: ABO/RH(D): A POS

## 2021-02-09 LAB — FIBRINOGEN
Fibrinogen: 88 mg/dL — CL (ref 210–475)
Fibrinogen: 163 mg/dL — ABNORMAL LOW (ref 210–475)

## 2021-02-09 LAB — SODIUM: Sodium: 143 mmol/L (ref 135–145)

## 2021-02-09 LAB — TSH: TSH: 2.249 u[IU]/mL (ref 0.350–4.500)

## 2021-02-09 LAB — MRSA NEXT GEN BY PCR, NASAL: MRSA by PCR Next Gen: NOT DETECTED

## 2021-02-09 LAB — PROCALCITONIN: Procalcitonin: 0.89 ng/mL

## 2021-02-09 SURGERY — CORONARY/GRAFT ACUTE MI REVASCULARIZATION
Anesthesia: Moderate Sedation

## 2021-02-09 MED ORDER — SODIUM CHLORIDE 3 % IV SOLN
INTRAVENOUS | Status: DC
  Filled 2021-02-09 (×5): qty 500

## 2021-02-09 MED ORDER — IOHEXOL 350 MG/ML SOLN
75.0000 mL | Freq: Once | INTRAVENOUS | Status: AC | PRN
  Administered 2021-02-09: 75 mL via INTRAVENOUS

## 2021-02-09 MED ORDER — VANCOMYCIN HCL 1500 MG/300ML IV SOLN
1500.0000 mg | Freq: Once | INTRAVENOUS | Status: AC
  Administered 2021-02-09: 1500 mg via INTRAVENOUS
  Filled 2021-02-09 (×2): qty 300

## 2021-02-09 MED ORDER — FREE WATER
250.0000 mL | Status: DC
  Administered 2021-02-09 (×2): 250 mL

## 2021-02-09 MED ORDER — SODIUM BICARBONATE 8.4 % IV SOLN
INTRAVENOUS | Status: DC
  Filled 2021-02-09: qty 1000
  Filled 2021-02-09: qty 150
  Filled 2021-02-09: qty 1000
  Filled 2021-02-09: qty 150
  Filled 2021-02-09: qty 1000

## 2021-02-09 MED ORDER — MIRTAZAPINE 15 MG PO TABS: 45.0000 mg | ORAL_TABLET | Freq: Every day | ORAL | Status: DC

## 2021-02-09 MED ORDER — NOREPINEPHRINE 16 MG/250ML-% IV SOLN
0.0000 ug/min | INTRAVENOUS | Status: DC
  Administered 2021-02-09: 26 ug/min via INTRAVENOUS
  Administered 2021-02-09: 67 ug/min via INTRAVENOUS
  Administered 2021-02-10: 45 ug/min via INTRAVENOUS
  Administered 2021-02-10: 80 ug/min via INTRAVENOUS
  Filled 2021-02-09 (×5): qty 250

## 2021-02-09 MED ORDER — DEXTROSE 50 % IV SOLN
INTRAVENOUS | Status: AC
  Administered 2021-02-09: 50 mL
  Filled 2021-02-09: qty 50

## 2021-02-09 MED ORDER — CHLORHEXIDINE GLUCONATE 0.12% ORAL RINSE (MEDLINE KIT)
15.0000 mL | Freq: Two times a day (BID) | OROMUCOSAL | Status: DC
  Administered 2021-02-09 (×2): 15 mL via OROMUCOSAL

## 2021-02-09 MED ORDER — FENTANYL CITRATE (PF) 100 MCG/2ML IJ SOLN
100.0000 ug | Freq: Once | INTRAMUSCULAR | Status: AC
Start: 2021-02-09 — End: 2021-02-09

## 2021-02-09 MED ORDER — MIDAZOLAM HCL 2 MG/2ML IJ SOLN: 1.0000 mg | INTRAMUSCULAR | Status: DC | PRN

## 2021-02-09 MED ORDER — SODIUM CHLORIDE 0.9 % IV BOLUS
500.0000 mL | Freq: Once | INTRAVENOUS | Status: AC
  Administered 2021-02-09: 500 mL via INTRAVENOUS

## 2021-02-09 MED ORDER — ETOMIDATE 2 MG/ML IV SOLN
20.0000 mg | Freq: Once | INTRAVENOUS | Status: AC
  Administered 2021-02-09: 20 mg via INTRAVENOUS
  Filled 2021-02-09: qty 10

## 2021-02-09 MED ORDER — INSULIN ASPART 100 UNIT/ML IJ SOLN
0.0000 [IU] | INTRAMUSCULAR | Status: DC
Start: 2021-02-09 — End: 2021-02-10
  Administered 2021-02-09: 3 [IU] via SUBCUTANEOUS
  Administered 2021-02-10: 2 [IU] via SUBCUTANEOUS
  Filled 2021-02-09 (×2): qty 1

## 2021-02-09 MED ORDER — HYDROXYZINE HCL 25 MG PO TABS
25.0000 mg | ORAL_TABLET | Freq: Three times a day (TID) | ORAL | Status: DC
  Filled 2021-02-09: qty 1

## 2021-02-09 MED ORDER — SODIUM BICARBONATE 8.4 % IV SOLN
INTRAVENOUS | Status: AC
  Administered 2021-02-09: 50 meq via INTRAVENOUS
  Filled 2021-02-09: qty 50

## 2021-02-09 MED ORDER — SODIUM BICARBONATE 8.4 % IV SOLN
50.0000 meq | Freq: Once | INTRAVENOUS | Status: AC
  Administered 2021-02-09: 50 meq via INTRAVENOUS
  Filled 2021-02-09: qty 50

## 2021-02-09 MED ORDER — SODIUM BICARBONATE 8.4 % IV SOLN: 50.0000 meq | Freq: Once | INTRAVENOUS | Status: AC

## 2021-02-09 MED ORDER — NOREPINEPHRINE 4 MG/250ML-% IV SOLN
2.0000 ug/min | INTRAVENOUS | Status: DC
  Administered 2021-02-09: 5 ug/min via INTRAVENOUS
  Filled 2021-02-09: qty 250

## 2021-02-09 MED ORDER — DEXMEDETOMIDINE HCL IN NACL 400 MCG/100ML IV SOLN
0.0000 ug/kg/h | INTRAVENOUS | Status: DC
  Administered 2021-02-09: 1 ug/kg/h via INTRAVENOUS
  Filled 2021-02-09: qty 100

## 2021-02-09 MED ORDER — CHLORHEXIDINE GLUCONATE CLOTH 2 % EX PADS
6.0000 | MEDICATED_PAD | Freq: Every day | CUTANEOUS | Status: DC
  Administered 2021-02-09: 6 via TOPICAL

## 2021-02-09 MED ORDER — FENTANYL CITRATE (PF) 100 MCG/2ML IJ SOLN
INTRAMUSCULAR | Status: AC
  Administered 2021-02-09: 100 ug via INTRAVENOUS
  Filled 2021-02-09: qty 2

## 2021-02-09 MED ORDER — SERTRALINE HCL 50 MG PO TABS
150.0000 mg | ORAL_TABLET | Freq: Every day | ORAL | Status: DC
  Administered 2021-02-09: 150 mg
  Filled 2021-02-09: qty 3

## 2021-02-09 MED ORDER — DEXTROSE 50 % IV SOLN
1.0000 | Freq: Once | INTRAVENOUS | Status: AC
  Administered 2021-02-09: 50 mL via INTRAVENOUS

## 2021-02-09 MED ORDER — LIDOCAINE HCL (PF) 1 % IJ SOLN
INTRAMUSCULAR | Status: AC
  Filled 2021-02-09: qty 30

## 2021-02-09 MED ORDER — PANTOPRAZOLE SODIUM 40 MG IV SOLR
40.0000 mg | Freq: Every day | INTRAVENOUS | Status: DC
  Administered 2021-02-09: 40 mg via INTRAVENOUS
  Filled 2021-02-09: qty 40

## 2021-02-09 MED ORDER — POLYETHYLENE GLYCOL 3350 17 G PO PACK
17.0000 g | PACK | Freq: Every day | ORAL | Status: DC
  Administered 2021-02-09: 17 g
  Filled 2021-02-09: qty 1

## 2021-02-09 MED ORDER — VECURONIUM BROMIDE 10 MG IV SOLR
INTRAVENOUS | Status: AC
  Filled 2021-02-09: qty 10

## 2021-02-09 MED ORDER — DEXMEDETOMIDINE HCL IN NACL 400 MCG/100ML IV SOLN: 0.4000 ug/kg/h | INTRAVENOUS | Status: DC

## 2021-02-09 MED ORDER — DOCUSATE SODIUM 50 MG/5ML PO LIQD
100.0000 mg | Freq: Two times a day (BID) | ORAL | Status: DC
  Administered 2021-02-09: 100 mg
  Filled 2021-02-09: qty 10

## 2021-02-09 MED ORDER — DEXTROSE-NACL 5-0.45 % IV SOLN: INTRAVENOUS | Status: DC

## 2021-02-09 MED ORDER — PERFLUTREN LIPID MICROSPHERE
1.0000 mL | INTRAVENOUS | Status: AC | PRN
  Administered 2021-02-09: 3 mL via INTRAVENOUS
  Filled 2021-02-09: qty 10

## 2021-02-09 MED ORDER — PROPOFOL 1000 MG/100ML IV EMUL
5.0000 ug/kg/min | INTRAVENOUS | Status: DC
  Administered 2021-02-09: 15 ug/kg/min via INTRAVENOUS
  Administered 2021-02-09: 40 ug/kg/min via INTRAVENOUS
  Filled 2021-02-09 (×2): qty 100

## 2021-02-09 MED ORDER — ORAL CARE MOUTH RINSE
15.0000 mL | OROMUCOSAL | Status: DC
  Administered 2021-02-09 – 2021-02-10 (×11): 15 mL via OROMUCOSAL

## 2021-02-09 MED ORDER — LACTATED RINGERS IV BOLUS
500.0000 mL | Freq: Once | INTRAVENOUS | Status: AC
  Administered 2021-02-09: 500 mL via INTRAVENOUS

## 2021-02-09 MED ORDER — CYCLOBENZAPRINE HCL 5 MG PO TABS: 5.0000 mg | ORAL_TABLET | Freq: Every day | ORAL | Status: DC

## 2021-02-09 MED ORDER — NOREPINEPHRINE 4 MG/250ML-% IV SOLN
0.0000 ug/min | INTRAVENOUS | Status: DC
  Administered 2021-02-09: 18 ug/min via INTRAVENOUS
  Administered 2021-02-09: 20 ug/min via INTRAVENOUS
  Filled 2021-02-09: qty 250

## 2021-02-09 MED ORDER — FENTANYL 2500MCG IN NS 250ML (10MCG/ML) PREMIX INFUSION
0.0000 ug/h | INTRAVENOUS | Status: DC
  Administered 2021-02-09: 25 ug/h via INTRAVENOUS
  Filled 2021-02-09 (×2): qty 250

## 2021-02-09 MED ORDER — OXYCODONE-ACETAMINOPHEN 5-325 MG PO TABS
1.0000 | ORAL_TABLET | ORAL | Status: DC | PRN
Start: 2021-02-09 — End: 2021-02-09

## 2021-02-09 MED ORDER — TRAZODONE HCL 100 MG PO TABS: 100.0000 mg | ORAL_TABLET | Freq: Every day | ORAL | Status: DC

## 2021-02-09 MED ORDER — ADULT MULTIVITAMIN W/MINERALS CH
1.0000 | ORAL_TABLET | Freq: Every day | ORAL | Status: DC
  Administered 2021-02-09: 1
  Filled 2021-02-09: qty 1

## 2021-02-09 MED ORDER — ACETAMINOPHEN 650 MG RE SUPP
650.0000 mg | RECTAL | Status: DC | PRN
  Administered 2021-02-09: 650 mg via RECTAL
  Filled 2021-02-09: qty 1

## 2021-02-09 MED ORDER — THIAMINE HCL 100 MG PO TABS
100.0000 mg | ORAL_TABLET | Freq: Every day | ORAL | Status: DC
  Administered 2021-02-09: 100 mg
  Filled 2021-02-09: qty 1

## 2021-02-09 MED ORDER — SODIUM CHLORIDE 0.9% IV SOLUTION: Freq: Once | INTRAVENOUS | Status: AC

## 2021-02-09 MED ORDER — ATORVASTATIN CALCIUM 20 MG PO TABS: 80.0000 mg | ORAL_TABLET | Freq: Every day | ORAL | Status: DC

## 2021-02-09 MED ORDER — FENTANYL CITRATE (PF) 100 MCG/2ML IJ SOLN: 25.0000 ug | INTRAMUSCULAR | Status: DC | PRN

## 2021-02-09 MED ORDER — DEXTROSE 50 % IV SOLN
1.0000 | Freq: Once | INTRAVENOUS | Status: DC
  Filled 2021-02-09: qty 50

## 2021-02-09 MED ORDER — SODIUM CHLORIDE 0.9 % IV SOLN
250.0000 mL | INTRAVENOUS | Status: DC
  Administered 2021-02-09: 250 mL via INTRAVENOUS

## 2021-02-09 MED ORDER — VANCOMYCIN VARIABLE DOSE PER UNSTABLE RENAL FUNCTION (PHARMACIST DOSING): Status: DC

## 2021-02-09 MED ORDER — MIDAZOLAM HCL 2 MG/2ML IJ SOLN
INTRAMUSCULAR | Status: AC
  Filled 2021-02-09: qty 4

## 2021-02-09 MED ORDER — MIDAZOLAM HCL 2 MG/2ML IJ SOLN
4.0000 mg | Freq: Once | INTRAMUSCULAR | Status: AC
  Administered 2021-02-09: 4 mg via INTRAVENOUS

## 2021-02-09 MED ORDER — IOHEXOL 350 MG/ML SOLN
50.0000 mL | Freq: Once | INTRAVENOUS | Status: AC | PRN
  Administered 2021-02-09: 50 mL via INTRAVENOUS

## 2021-02-09 MED ORDER — ACETAMINOPHEN 325 MG PO TABS: 650.0000 mg | ORAL_TABLET | Freq: Four times a day (QID) | ORAL | Status: DC | PRN

## 2021-02-09 MED ORDER — ASPIRIN 81 MG PO CHEW
81.0000 mg | CHEWABLE_TABLET | Freq: Every day | ORAL | Status: DC
  Administered 2021-02-09: 81 mg
  Filled 2021-02-09: qty 1

## 2021-02-09 MED ORDER — ASPIRIN 81 MG PO CHEW: 81.0000 mg | CHEWABLE_TABLET | Freq: Every day | ORAL | Status: DC

## 2021-02-09 MED ORDER — PROPOFOL 1000 MG/100ML IV EMUL
INTRAVENOUS | Status: AC
  Administered 2021-02-09: 15 ug/kg/min via INTRAVENOUS
  Filled 2021-02-09: qty 100

## 2021-02-09 MED ORDER — MIDAZOLAM HCL 2 MG/2ML IJ SOLN
1.0000 mg | INTRAMUSCULAR | Status: DC | PRN
  Administered 2021-02-09: 1 mg via INTRAVENOUS
  Filled 2021-02-09: qty 2

## 2021-02-09 MED ORDER — VITAL HIGH PROTEIN PO LIQD
1000.0000 mL | ORAL | Status: DC
  Administered 2021-02-09: 1000 mL

## 2021-02-09 MED ORDER — FOLIC ACID 1 MG PO TABS
1.0000 mg | ORAL_TABLET | Freq: Every day | ORAL | Status: DC
  Administered 2021-02-09: 1 mg
  Filled 2021-02-09: qty 1

## 2021-02-09 MED ORDER — FENTANYL CITRATE (PF) 100 MCG/2ML IJ SOLN
25.0000 ug | INTRAMUSCULAR | Status: DC | PRN
  Administered 2021-02-09: 100 ug via INTRAVENOUS
  Filled 2021-02-09: qty 2

## 2021-02-09 MED ORDER — SODIUM CHLORIDE 0.9 % IV SOLN
2.0000 g | Freq: Every day | INTRAVENOUS | Status: DC
  Administered 2021-02-09: 2 g via INTRAVENOUS
  Filled 2021-02-09 (×3): qty 2

## 2021-02-09 MED ORDER — ATORVASTATIN CALCIUM 20 MG PO TABS
80.0000 mg | ORAL_TABLET | Freq: Every day | ORAL | Status: DC
  Administered 2021-02-09: 80 mg via ORAL
  Filled 2021-02-09: qty 4

## 2021-02-09 MED ORDER — PHENYLEPHRINE CONCENTRATED 100MG/250ML (0.4 MG/ML) INFUSION SIMPLE
0.0000 ug/min | INTRAVENOUS | Status: DC
  Administered 2021-02-09: 400 ug/min via INTRAVENOUS
  Administered 2021-02-09: 50 ug/min via INTRAVENOUS
  Administered 2021-02-10: 340 ug/min via INTRAVENOUS
  Administered 2021-02-10: 360 ug/min via INTRAVENOUS
  Filled 2021-02-09 (×4): qty 250

## 2021-02-09 NOTE — Procedures (Signed)
Central Venous Catheter Insertion Procedure Note  Amy Becker  [id number]  1951-07-04  Date:2021/02/16  Time:10:18 AM   Provider Performing:Shanica Castellanos Laney Pastor   Procedure: Insertion of Non-tunneled Central Venous Catheter(36556) without US guidance  Indication(s) Medication administration  Consent Risks of the procedure as well as the alternatives and risks of each were explained to the patient and/or caregiver.  Consent for the procedure was obtained and is signed in the bedside chart  Anesthesia None  Timeout Verified patient identification, verified procedure, site/side was marked, verified correct patient position, special equipment/implants available, medications/allergies/relevant history reviewed, required imaging and test results available.  Sterile Technique Maximal sterile technique including full sterile barrier drape, hand hygiene, sterile gown, sterile gloves, mask, hair covering, sterile ultrasound probe cover (if used).  Procedure Description Area of catheter insertion was cleaned with chlorhexidine and draped in sterile fashion.  Without real-time ultrasound guidance a central venous catheter was placed into the right subclavian vein. Nonpulsatile blood flow and easy flushing noted in all ports.  The catheter was sutured in place and sterile dressing applied.  Complications/Tolerance None; patient tolerated the procedure well. Chest X-ray shows appropriate placement of the right subclavian CVC without pneumothorax.  EBL Minimal  Specimen(s) None  [name], MD 2021/02/16 10:19 AM

## 2021-02-09 NOTE — Progress Notes (Signed)
Provider aware of pt's urine output this shift. Ng feed stopped due to increasing inotropic support. Provider aware of pt's blood glucose.

## 2021-02-09 NOTE — Procedures (Signed)
Arterial Catheter Insertion Procedure Note  JAQUAYA COYLE  [id number]  [birth year]  Date:02/08/2021  Time:10:19 AM    Provider Performing: Shari Heritage Kaston Faughn    Procedure: Insertion of Arterial Line (93903) with US guidance (00923)   Indication(s) Blood pressure monitoring and/or need for frequent ABGs  Consent Risks of the procedure as well as the alternatives and risks of each were explained to the patient and/or caregiver.  Consent for the procedure was obtained and is signed in the bedside chart  Anesthesia Propofol.   Time Out Verified patient identification, verified procedure, site/side was marked, verified correct patient position, special equipment/implants available, medications/allergies/relevant history reviewed, required imaging and test results available.   Sterile Technique Maximal sterile technique including full sterile barrier drape, hand hygiene, sterile gown, sterile gloves, mask, hair covering, sterile ultrasound probe cover (if used).   Procedure Description Area of catheter insertion was cleaned with chlorhexidine and draped in sterile fashion. With real-time ultrasound guidance an arterial catheter was placed into the right radial artery.  Appropriate arterial tracings confirmed on monitor.     Complications/Tolerance None; patient tolerated the procedure well.   EBL Minimal   Specimen(s) None  [name], MD 02/09/21 10:19 AM

## 2021-02-09 NOTE — Consult Note (Addendum)
PROGRESS NOTE  Cross coverage progress note  Amy Becker  UJW:[id number] DOB: 1951/01/15 DOA: 01/29/2021 PCP: Orson Aloe, MD  Brief Narrative:  Patient admitted with severe sepsis secondary to multifocal pneumonia with acute hypoxic respiratory failure on high flow nasal cannula at 8 L with associated encephalopathy, complicated by symptoms of alcohol withdrawal requiring Ativan.  During the course of the night she had episodes of agitation and restlessness which required a small dose of IV Ativan as she was not cleared to take by mouth due to encephalopathy.  According to the nurse caring for her after calming down she was noted to become tachypneic and have gurgling breathing   Assessment & Plan: #Worsening acute respiratory failure  #Suspect aspiration superimposed on worsening of multifocal pneumonia #Worsening Acute toxic metabolic encephalopathy, multifactorial with inability to protect airway  - Patient was suctioned with aspiration of significant amount of phlegm but without significant improvement in respiratory status or improvement in mentation - ABG, chest x-ray, labs reviewed as available - Transferred to ICU for likely mechanical ventilation for airway protection - Continue other management of severe sepsis and pneumonia - Care transferred to PCCM - Plan of care discussed with husband at bedside  CRITICAL CARE Performed by: Andris Baumann   Total critical care time: 60 minutes  Critical care time was exclusive of separately billable procedures and treating other patients.  Critical care was necessary to treat or prevent imminent or life-threatening deterioration.  Critical care was time spent personally by me on the following activities: development of treatment plan with patient and/or surrogate as well as nursing, discussions with consultants, evaluation of patient's response to treatment, examination of patient, obtaining history from patient or  surrogate, ordering and performing treatments and interventions, ordering and review of laboratory studies, ordering and review of radiographic studies, pulse oximetry and re-evaluation of patient's condition.  Principal Problem:   Pneumonia of both lungs due to infectious organism Active Problems:   Hyponatremia   ETOH abuse   Tobacco abuse   Severe sepsis (HCC)   Hypokalemia   Hypertension   Visual floaters   Diarrhea   Thrombocytopenia (HCC)   Depression with anxiety   Joint pain   Elevated troponin   HLD (hyperlipidemia)   Acute metabolic encephalopathy   Alcohol withdrawal syndrome, with delirium (HCC)   Streptococcus agalactiae infection   Acute respiratory failure with hypoxia (HCC)  Subjective: Patient altered.  Unable to get history  Objective: Vitals:   02/08/21 2023 02/08/21 2055 02/12/2021 0056 01/18/2021 0245  BP:  135/74 (!) 137/120 135/69  Pulse:  97 (!) 103 (!) 108  Resp:  20 20 (!) 32  Temp:  98.5 F (36.9 C) 98.1 F (36.7 C) 98.2 F (36.8 C)  TempSrc:  Oral    SpO2: 93% 96% (!) 88% 93%  Weight:      Height:        Intake/Output Summary (Last 24 hours) at 02/03/2021 0406 Last data filed at 02/08/2021 5621 Gross per 24 hour  Intake 1050 ml  Output 2650 ml  Net -1600 ml   Filed Weights   01/19/2021 1043  Weight: 61.2 kg    Examination:  General exam: Patient obtunded, unable to arouse, minimal response to NTS  respiratory system: Gurgling respirations severe respiratory distress with use of accessory muscles cardiovascular system: S1 & S2 heard, RRR. No JVD, murmurs, rubs, gallops or clicks. No pedal edema. Gastrointestinal system: Abdomen is nondistended, soft  Central nervous system: No focalizing signs  Data Reviewed: I have personally reviewed following labs and imaging studies  CBC: Recent Labs  Lab 03/06/21 1045 02/05/21 0436 02/05/21 1143 02/07/21 0418 02/08/21 0823  WBC 15.0* 20.3* 15.9* 31.6* 35.9*  NEUTROABS  --   --   --    --  31.6*  HGB 15.9* 13.6 12.7 13.2 14.2  HCT 44.6 36.5 34.3* 36.2 38.8  MCV 89.6 87.1 86.8 85.2 86.8  PLT 53* 49* 51* 124* 165   Basic Metabolic Panel: Recent Labs  Lab 03/06/21 1440 March 06, 2021 1759 02/05/21 0436 02/05/21 1143 02/07/21 0418 02/07/21 2118 02/08/21 0823  NA 130*  --  132* 133* 142  --  146*  K 2.9*  --  3.8 3.4* 2.9* 2.9* 2.9*  CL 97*  --  99 104 116*  --  113*  CO2 18*  --  18* 18* 17*  --  21*  GLUCOSE 80  --  73 104* 103*  --  111*  BUN 20  --  --  16  CREATININE 0.66  --  0.54 0.56 0.61  --  0.57  CALCIUM 8.8*  --  8.6* 8.5* 8.7*  --  9.2  MG 1.7  --  2.2 1.9 2.0  --   --   PHOS  --  3.7  --  3.4  --   --   --    GFR: Estimated Creatinine Clearance: 54.2 mL/min (by C-G formula based on SCr of 0.57 mg/dL). Liver Function Tests: Recent Labs  Lab March 06, 2021 1440 02/05/21 1143  AST 34 36  ALT 11 12  ALKPHOS 101 76  BILITOT 1.9* 1.5*  PROT 6.0* 5.0*  ALBUMIN 2.6* 2.1*   No results for input(s): LIPASE, AMYLASE in the last 168 hours. No results for input(s): AMMONIA in the last 168 hours. Coagulation Profile: Recent Labs  Lab 02/07/21 0418  INR 1.2   Cardiac Enzymes: No results for input(s): CKTOTAL, CKMB, CKMBINDEX, TROPONINI in the last 168 hours. BNP (last 3 results) No results for input(s): PROBNP in the last 8760 hours. HbA1C: No results for input(s): HGBA1C in the last 72 hours. CBG: Recent Labs  Lab 01/24/2021 0405  GLUCAP 93   Lipid Profile: No results for input(s): CHOL, HDL, LDLCALC, TRIG, CHOLHDL, LDLDIRECT in the last 72 hours. Thyroid Function Tests: No results for input(s): TSH, T4TOTAL, FREET4, T3FREE, THYROIDAB in the last 72 hours. Anemia Panel: No results for input(s): VITAMINB12, FOLATE, FERRITIN, TIBC, IRON, RETICCTPCT in the last 72 hours. Sepsis Labs: Recent Labs  Lab 2021/03/06 1337 03-06-21 1440 03/06/2021 1622  PROCALCITON  --  1.74  --   LATICACIDVEN 2.2*  --  1.6    Recent Results (from the past 240  hour(s))  Urine Culture     Status: Abnormal   Collection Time: 2021-03-06 10:45 AM   Specimen: Urine, Clean Catch  Result Value Ref Range Status   Specimen Description   Final    URINE, CLEAN CATCH Performed at [facility], [address], [place], [place] [address]    Special Requests   Final    NONE Performed at [facility], [address] [place], [place] [address]    Culture (A)  Final    50,000 COLONIES/mL GROUP B STREP(S.AGALACTIAE)ISOLATED TESTING AGAINST S. AGALACTIAE NOT ROUTINELY PERFORMED DUE TO PREDICTABILITY OF AMP/PEN/VAN SUSCEPTIBILITY. Performed at [facility], [address], [place], [place] [address]    Report Status 02/06/2021 FINAL  Final  Resp Panel by RT-PCR (Flu  A&B, Covid) Nasopharyngeal Swab     Status: None   Collection Time: 02/17/2021  1:37 PM   Specimen: Nasopharyngeal Swab; Nasopharyngeal(NP) swabs in vial transport medium  Result Value Ref Range Status   SARS Coronavirus 2 by RT PCR NEGATIVE NEGATIVE Final    Comment: (NOTE) SARS-CoV-2 target nucleic acids are NOT DETECTED.  The SARS-CoV-2 RNA is generally detectable in upper respiratory specimens during the acute phase of infection. The lowest concentration of SARS-CoV-2 viral copies this assay can detect is 138 copies/mL. A negative result does not preclude SARS-Cov-2 infection and should not be used as the sole basis for treatment or other patient management decisions. A negative result may occur with  improper specimen collection/handling, submission of specimen other than nasopharyngeal swab, presence of viral mutation(s) within the areas targeted by this assay, and inadequate number of viral copies(<138 copies/mL). A negative result must be combined with clinical observations, patient history, and epidemiological information. The expected result is Negative.  Fact Sheet for Patients:  BloggerCourse.com  Fact Sheet for Healthcare  Providers:  SeriousBroker.it  This test is no t yet approved or cleared by the [place] FDA and  has been authorized for detection and/or diagnosis of SARS-CoV-2 by FDA under an Emergency Use Authorization (EUA). This EUA will remain  in effect (meaning this test can be used) for the duration of the COVID-19 declaration under Section 564(b)(1) of the Act, 21 U.S.C.section 360bbb-3(b)(1), unless the authorization is terminated  or revoked sooner.       Influenza A by PCR NEGATIVE NEGATIVE Final   Influenza B by PCR NEGATIVE NEGATIVE Final    Comment: (NOTE) The Xpert Xpress SARS-CoV-2/FLU/RSV plus assay is intended as an aid in the diagnosis of influenza from Nasopharyngeal swab specimens and should not be used as a sole basis for treatment. Nasal washings and aspirates are unacceptable for Xpert Xpress SARS-CoV-2/FLU/RSV testing.  Fact Sheet for Patients: BloggerCourse.com  Fact Sheet for Healthcare Providers: SeriousBroker.it  This test is not yet approved or cleared by the [place] FDA and has been authorized for detection and/or diagnosis of SARS-CoV-2 by FDA under an Emergency Use Authorization (EUA). This EUA will remain in effect (meaning this test can be used) for the duration of the COVID-19 declaration under Section 564(b)(1) of the Act, 21 U.S.C. section 360bbb-3(b)(1), unless the authorization is terminated or revoked.  Performed at [facility], [address] [place], [place] [address]   Blood culture (single)     Status: Abnormal   Collection Time: 02/17/21  3:59 PM   Specimen: BLOOD  Result Value Ref Range Status   Specimen Description   Final    BLOOD LEFT ANTECUBITAL Performed at [facility], [address] [place], [place] [address]    Special Requests   Final    BOTTLES DRAWN AEROBIC AND ANAEROBIC Blood Culture results may not be optimal due to  an inadequate volume of blood received in culture bottles Performed at [facility], [address], [place], [place] [address]    Culture  Setup Time   Final    GRAM POSITIVE COCCI IN BOTH AEROBIC AND ANAEROBIC BOTTLES CRITICAL RESULT CALLED TO, READ BACK BY AND VERIFIED WITH: JASON ROBINS @0113  ON 02/05/21 SKL Performed at [facility], [address], [place], [place]    Culture GROUP B STREP(S.AGALACTIAE)ISOLATED (A)  Final   Report Status 02/07/2021 FINAL  Final   Organism ID, Bacteria GROUP B STREP(S.AGALACTIAE)ISOLATED  Final  Susceptibility   Group b strep(s.agalactiae)isolated - MIC*    CLINDAMYCIN <=0.25 SENSITIVE Sensitive     AMPICILLIN <=0.25 SENSITIVE Sensitive     ERYTHROMYCIN <=0.12 SENSITIVE Sensitive     VANCOMYCIN 0.5 SENSITIVE Sensitive     CEFTRIAXONE <=0.12 SENSITIVE Sensitive     LEVOFLOXACIN 1 SENSITIVE Sensitive     PENICILLIN Value in next row Sensitive      SENSITIVE0.06    * GROUP B STREP(S.AGALACTIAE)ISOLATED  Blood Culture ID Panel (Reflexed)     Status: Abnormal   Collection Time: 02/28/21  3:59 PM  Result Value Ref Range Status   Enterococcus faecalis NOT DETECTED NOT DETECTED Final   Enterococcus Faecium NOT DETECTED NOT DETECTED Final   Listeria monocytogenes NOT DETECTED NOT DETECTED Final   Staphylococcus species NOT DETECTED NOT DETECTED Final   Staphylococcus aureus (BCID) NOT DETECTED NOT DETECTED Final   Staphylococcus epidermidis NOT DETECTED NOT DETECTED Final   Staphylococcus lugdunensis NOT DETECTED NOT DETECTED Final   Streptococcus species DETECTED (A) NOT DETECTED Final    Comment: CRITICAL RESULT CALLED TO, READ BACK BY AND VERIFIED WITH: JASON ROBINS @0113  ON 02/05/21 SKL    Streptococcus agalactiae DETECTED (A) NOT DETECTED Final    Comment: CRITICAL RESULT CALLED TO, READ BACK BY AND VERIFIED WITH: JASON ROBINS @0113  ON 02/05/21 SKL    Streptococcus pneumoniae NOT DETECTED NOT DETECTED Final    Streptococcus pyogenes NOT DETECTED NOT DETECTED Final   A.calcoaceticus-baumannii NOT DETECTED NOT DETECTED Final   Bacteroides fragilis NOT DETECTED NOT DETECTED Final   Enterobacterales NOT DETECTED NOT DETECTED Final   Enterobacter cloacae complex NOT DETECTED NOT DETECTED Final   Escherichia coli NOT DETECTED NOT DETECTED Final   Klebsiella aerogenes NOT DETECTED NOT DETECTED Final   Klebsiella oxytoca NOT DETECTED NOT DETECTED Final   Klebsiella pneumoniae NOT DETECTED NOT DETECTED Final   Proteus species NOT DETECTED NOT DETECTED Final   Salmonella species NOT DETECTED NOT DETECTED Final   Serratia marcescens NOT DETECTED NOT DETECTED Final   Haemophilus influenzae NOT DETECTED NOT DETECTED Final   Neisseria meningitidis NOT DETECTED NOT DETECTED Final   Pseudomonas aeruginosa NOT DETECTED NOT DETECTED Final   Stenotrophomonas maltophilia NOT DETECTED NOT DETECTED Final   Candida albicans NOT DETECTED NOT DETECTED Final   Candida auris NOT DETECTED NOT DETECTED Final   Candida glabrata NOT DETECTED NOT DETECTED Final   Candida krusei NOT DETECTED NOT DETECTED Final   Candida parapsilosis NOT DETECTED NOT DETECTED Final   Candida tropicalis NOT DETECTED NOT DETECTED Final   Cryptococcus neoformans/gattii NOT DETECTED NOT DETECTED Final    Comment: Performed at [facility], [address] [place], [address] [place]  Expectorated Sputum Assessment w Gram Stain, Rflx to Resp Cult     Status: None   Collection Time: 02-28-2021  7:15 PM   Specimen: SPU  Result Value Ref Range Status   Specimen Description SPUTUM  Final   Special Requests NONE  Final   Sputum evaluation   Final    THIS SPECIMEN IS ACCEPTABLE FOR SPUTUM CULTURE Performed at [facility], [address] [place], [address] [place]    Report Status 02/05/2021 FINAL  Final  Culture, Respiratory w Gram Stain     Status: None   Collection Time: 2021/02/28  7:15 PM   Specimen: SPU  Result Value Ref  Range Status   Specimen Description   Final    SPUTUM Performed at [facility], [address] [place], [address]  [address]    Special Requests   Final    NONE Reflexed from [phone] Performed at [facility], [address] [place], [place] [address]    Gram Stain   Final    FEW WBC PRESENT, PREDOMINANTLY MONONUCLEAR NO ORGANISMS SEEN    Culture   Final    RARE Normal respiratory flora-no Staph aureus or Pseudomonas seen Performed at [facility], [address], [place], [place] [address]    Report Status 02/08/2021 FINAL  Final  CULTURE, BLOOD (ROUTINE X 2) w Reflex to ID Panel     Status: None (Preliminary result)   Collection Time: 02/06/21  1:00 AM   Specimen: BLOOD RIGHT HAND  Result Value Ref Range Status   Specimen Description BLOOD RIGHT HAND  Final   Special Requests   Final    BOTTLES DRAWN AEROBIC AND ANAEROBIC Blood Culture adequate volume   Culture   Final    NO GROWTH 2 DAYS Performed at [facility], [address] [place], [place] [address]    Report Status PENDING  Incomplete  CULTURE, BLOOD (ROUTINE X 2) w Reflex to ID Panel     Status: None (Preliminary result)   Collection Time: 02/06/21  1:01 AM   Specimen: BLOOD LEFT HAND  Result Value Ref Range Status   Specimen Description BLOOD LEFT HAND  Final   Special Requests   Final    BOTTLES DRAWN AEROBIC AND ANAEROBIC Blood Culture results may not be optimal due to an inadequate volume of blood received in culture bottles   Culture   Final    NO GROWTH 2 DAYS Performed at [facility], [address] [place], [place] [address]    Report Status PENDING  Incomplete  MRSA Next Gen by PCR, Nasal     Status: None   Collection Time: 02/08/21  3:00 PM   Specimen: Nasal Mucosa; Nasal Swab  Result Value Ref Range Status   MRSA by PCR Next Gen NOT DETECTED NOT DETECTED Final    Comment: (NOTE) The GeneXpert MRSA Assay (FDA approved for NASAL specimens only), is  one component of a comprehensive MRSA colonization surveillance program. It is not intended to diagnose MRSA infection nor to guide or monitor treatment for MRSA infections. Test performance is not FDA approved in patients less than 27 years old. Performed at [facility], [address] [place], [place] [address]          Radiology Studies: DG Chest Trail 1 View  Result Date: 02/07/2021 CLINICAL DATA:  Difficulty breathing EXAM: PORTABLE CHEST 1 VIEW COMPARISON:  CT February 28, 2021, radiograph 02/08/2021 FINDINGS: Increasing hazy interstitial and patchy opacities throughout the lungs, particular within the left upper lung when compared to most recent prior imaging. No visible pneumothorax or layering pleural effusion. Stable cardiomediastinal contours with a calcified aorta. No acute or worrisome osseous or soft tissue abnormality of the chest wall. IMPRESSION: Increasing hazy interstitial opacities throughout the lungs concerning for worsening pneumonia and/or developing edema. Aortic Atherosclerosis (ICD10-I70.0). Electronically Signed   By: Kreg Shropshire M.D.   On: 02/14/2021 03:46   DG Chest Port 1 View  Result Date: 02/08/2021 CLINICAL DATA:  Sepsis, wall with dural, hypoxia EXAM: PORTABLE CHEST 1 VIEW COMPARISON:  01/26/2019 chest radiograph. 2021/02/28 chest CT angiogram. FINDINGS: Stable cardiomediastinal silhouette with top-normal heart size. No pneumothorax. No pleural effusion. Extensive patchy opacity throughout the right greater than left lungs, largely new. IMPRESSION: Extensive patchy opacity throughout the right greater  than left lungs, largely new, suspicious for multilobar pneumonia. Electronically Signed   By: Delbert PhenixJason A Poff M.D.   On: 02/08/2021 09:36   US EKG SITE RITE  Result Date: 02/08/2021 If Site Rite image not attached, placement could not be confirmed due to current cardiac rhythm.       Scheduled Meds:  fentaNYL       midazolam       vecuronium        budesonide (PULMICORT) nebulizer solution  0.5 mg Nebulization BID   cyclobenzaprine  5 mg Oral QHS   diclofenac  75 mg Oral Q breakfast   folic acid  1 mg Oral Daily   hydrOXYzine  25 mg Oral TID   ipratropium-albuterol  3 mL Nebulization Q6H   lidocaine  1 patch Transdermal Q24H   lisinopril  40 mg Oral Daily   metoprolol succinate  25 mg Oral Daily   mirtazapine  45 mg Oral QHS   nicotine  21 mg Transdermal Daily   pantoprazole  20 mg Oral Daily   sertraline  150 mg Oral Daily   thiamine  100 mg Oral Daily   traZODone  100 mg Oral QHS   Continuous Infusions:  0.9 % NaCl with KCl 20 mEq / L 50 mL/hr at 02/08/21 1051   azithromycin 500 mg (02/08/21 1207)   cefTRIAXone (ROCEPHIN)  IV 2 g (02/08/21 1201)   metronidazole 500 mg (02/08/21 2227)     LOS: 5 days    Time spent: 1960    Andris BaumannHazel V Ngan Qualls, MD [facility]

## 2021-02-09 NOTE — Progress Notes (Signed)
Dr. Para March in room 117 to evaluate patient for further treatment.

## 2021-02-09 NOTE — Progress Notes (Signed)
Contacted CDS per family request to Dr Arlean Hopping.   [phone]  Joanne Chars, CDS

## 2021-02-09 NOTE — Plan of Care (Signed)
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 There also is no Doppler left pedal pulse. 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  Lab Results  Component Value Date/Time   INR 2.8 (H) 02-12-2021 09:30 AM   INR 1.2 02/07/2021 04:18 AM   DDIMER >20.00 (H) Feb 12, 2021 05:09 AM   FIBRINOGEN 88 (LL) 02-12-21 09:30 AM   Component 13:21   WBC MORPHOLOGY MORPHOLOGY UNREMARKABLE   RBC MORPHOLOGY Schistocytes present   Tech Review PLATELETS APPEAR DECREASED     -Will give 1 pool cryoprecipitate and repeat DIC labs in afternoon -Will obtain arterial and venous Dopplers of BLE -Will hold on Hematology or Vascular consultations at this time in the setting of minimal benefit to the patient as she is not a surgical candidate, nor is she a candidate for anticoagulation  [name], MD February 12, 2021 2:17 PM

## 2021-02-09 NOTE — Consult Note (Signed)
Pharmacy Antibiotic Note  Amy Becker is a 70 y.o. female admitted on 02/11/2021 with  Strepotococcus agalactiae bacteremia and bilateral pneumonia . Patient decompensated early 7/24 AM and code stroke was called. 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 Patient was emergently transferred to the ICU where she was intubated. She remains intubated, sedated, and on mechanical ventilation. Patient is requiring vasopressors for cardiovascular support. Pharmacy has been consulted for vancomycin and cefepime dosing.  Plan:  Cefepime 2 g IV q24h (renally adjusted)  Vancomycin 1.5 g IV LD x 1 --Will dose per levels for now given AKI and tenuous status --Daily Scr per protocol while on vancomycin  Height: 5\' 1"  (154.9 cm) Weight: 61.2 kg (135 lb) IBW/kg (Calculated) : 47.8  Temp (24hrs), Avg:99 F (37.2 C), Min:98.1 F (36.7 C), Max:100.1 F (37.8 C)  Recent Labs  Lab 01/31/2021 1337 01/31/2021 1356 01/26/2021 1622 02/05/21 0436 02/05/21 1143 02/07/21 0418 02/08/21 0823 01/28/2021 0509 02/13/2021 0800  WBC  --   --   --  20.3* 15.9* 31.6* 35.9* 34.0*  --   CREATININE  --    < >  --  0.54 0.56 0.61 0.57 1.45*  --   LATICACIDVEN 2.2*  --  1.6  --   --   --   --  6.0* 3.5*   < > = values in this interval not displayed.    Estimated Creatinine Clearance: 29.9 mL/min (A) (by C-G formula based on SCr of 1.45 mg/dL (H)).    Allergies  Allergen Reactions   Hydromorphone Hives   Cefaclor Hives   Tetracycline Rash    Antimicrobials this admission: Azithromycin 7/19 >>  Ceftriaxone 7/19 >> 7/20, 7/23 x 1 Cefazolin 7/21 >> 7/23 Metronidazole 7/23 x 1 Cefepime 7/24 >>  Vancomycin 7/24 >>   Dose adjustments this admission: N/A  Microbiology results: 7/24 Sputum: Pending 7/24 BCx: Pending 7/24 MRSA PCR: (-) 7/23 MRSA PCR: (-) 7/21 BCx: NGTD 7/19 BCx: 2/4 bottles Streptococcus agalactiae (pan-sensitive) 7/19 UCx: 50k CFU/mL Streptococcus agalactiae  7/19 Sputum: Normal respiratory  flora   Thank you for allowing pharmacy to be a part of this patient's care.  8/19 02/03/2021 12:33 PM

## 2021-02-09 NOTE — Progress Notes (Signed)
MD ordered stat ABG and lab work as well as stat chest xray.

## 2021-02-09 NOTE — Consult Note (Addendum)
[facility] Cardiology  CARDIOLOGY CONSULT NOTE  Patient ID: Amy Becker MRN: [id number] DOB/AGE: 08-25-1950 70 y.o.  Admit date: 02/06/2021 Referring Physician Para March Primary Physician  Primary Cardiologist  Reason for Consultation inferior STEMI  HPI: 70 year old female referred for inferior STEMI.  Patient was admitted 01/27/2021 with severe sepsis, multifocal pneumonia, acute hypoxic respiratory failure, alcohol withdrawal, and associated encephalopathy.  Patient developed worsening respiratory failure, likely due to aspiration pneumonia, transferred to ICU, for mechanical ventilation.  At 4 AM earlier today, patient became more somnolent.  CT scan revealed acute left superior cerebellar and occipital infarcts with hyperdense left PCA.  Incidentally, telemetry revealed new ECG changes, and twelve-lead ECG revealed evidence for inferior STEMI.  Patient at this time was unresponsive and sedated.  CTA was performed which revealed diffuse left ICA occlusion.  I personally discussed the situation with the telemedicine neurologist who noted that the anticoagulation for cardiac catheterization and percutaneous coronary invention would likely result in hemorrhagic conversion.  Review of systems complete and found to be negative unless listed above     Past Medical History:  Diagnosis Date   Alcohol abuse    Hypertension    Hyponatremia 01/26/2019    Past Surgical History:  Procedure Laterality Date   ABDOMINAL HYSTERECTOMY      Medications Prior to Admission  Medication Sig Dispense Refill Last Dose   atorvastatin (LIPITOR) 20 MG tablet Take 20 mg by mouth daily.   02/03/2021 at 1930   cyclobenzaprine (FLEXERIL) 5 MG tablet Take 5 mg by mouth 2 (two) times daily.   02/03/2021 at 1930   diclofenac (VOLTAREN) 75 MG EC tablet Take 75 mg by mouth 2 (two) times daily with a meal.   02/03/2021 at 0700   gabapentin (NEURONTIN) 600 MG tablet Take 600 mg by mouth 2 (two) times a day.   02/03/2021 at 0700    hydrOXYzine (ATARAX/VISTARIL) 25 MG tablet Take 25 mg by mouth 3 (three) times daily.   02/03/2021 at 1930   lansoprazole (PREVACID) 30 MG capsule Take 30 mg by mouth daily.   02/03/2021 at 0700   lisinopril (ZESTRIL) 40 MG tablet Take 1 tablet (40 mg total) by mouth daily. 30 tablet 0 02/03/2021 at 0700   LORazepam (ATIVAN) 1 MG tablet Take 1 mg by mouth daily as needed for anxiety.   02/03/2021 at 0700   metoprolol succinate (TOPROL-XL) 25 MG 24 hr tablet Take 25 mg by mouth daily.   02/03/2021 at 0700   mirtazapine (REMERON) 30 MG tablet Take 30 mg by mouth at bedtime.   02/03/2021 at 1930   sertraline (ZOLOFT) 100 MG tablet Take 100 mg by mouth daily.   02/03/2021 at 0700   traZODone (DESYREL) 100 MG tablet Take 100 mg by mouth at bedtime.   02/03/2021 at 1930   calcium carbonate (TUMS - DOSED IN MG ELEMENTAL CALCIUM) 500 MG chewable tablet Chew 2 tablets (400 mg of elemental calcium total) by mouth 3 (three) times daily as needed for indigestion or heartburn.   prn at prn   estradiol (CLIMARA - DOSED IN MG/24 HR) 0.0375 mg/24hr patch Place 0.0375 mg onto the skin once a week.   02/01/2021   folic acid (FOLVITE) 1 MG tablet Take 1 tablet (1 mg total) by mouth daily. (Patient not taking: No sig reported)   Not Taking   Multiple Vitamin (MULTIVITAMIN WITH MINERALS) TABS tablet Take 1 tablet by mouth daily. (Patient not taking: No sig reported)   Not Taking   Social  History   Socioeconomic History   Marital status: Married    Spouse name: Not on file   Number of children: Not on file   Years of education: Not on file   Highest education level: Not on file  Occupational History   Not on file  Tobacco Use   Smoking status: Every Day    Packs/day: 1.00    Years: 40.00    Pack years: 40.00    Types: Cigarettes   Smokeless tobacco: Never  Vaping Use   Vaping Use: Never used  Substance and Sexual Activity   Alcohol use: Yes   Drug use: Never   Sexual activity: Not on file  Other Topics Concern    Not on file  Social History Narrative   Not on file   Social Determinants of Health   Financial Resource Strain: Not on file  Food Insecurity: Not on file  Transportation Needs: Not on file  Physical Activity: Not on file  Stress: Not on file  Social Connections: Not on file  Intimate Partner Violence: Not on file    Family History  Problem Relation Age of Onset   CAD Mother       Review of systems complete and found to be negative unless listed above      PHYSICAL EXAM  General: Well developed, well nourished, in no acute distress HEENT:  Normocephalic and atramatic Neck:  No JVD.  Lungs: Clear bilaterally to auscultation and percussion. Heart: HRRR . Normal S1 and S2 without gallops or murmurs.  Abdomen: Bowel sounds are positive, abdomen soft and non-tender  Msk:  Back normal, normal gait. Normal strength and tone for age. Extremities: No clubbing, cyanosis or edema.   Neuro: Alert and oriented X 3. Psych:  Good affect, responds appropriately  Labs:   Lab Results  Component Value Date   WBC 35.9 (H) 02/08/2021   HGB 14.2 02/08/2021   HCT 38.8 02/08/2021   MCV 86.8 02/08/2021   PLT 165 02/08/2021    Recent Labs  Lab 03/03/2021 0509  NA 148*  K 4.3  CL 118*  CO2 16*  BUN 34*  CREATININE 1.45*  CALCIUM 8.6*  PROT 5.2*  BILITOT 1.7*  ALKPHOS 192*  ALT 24  AST 180*  GLUCOSE 114*   No results found for: CKTOTAL, CKMB, CKMBINDEX, TROPONINI  Lab Results  Component Value Date   CHOL 68 02/05/2021   Lab Results  Component Value Date   HDL 19 (L) 02/05/2021   Lab Results  Component Value Date   LDLCALC 31 02/05/2021   Lab Results  Component Value Date   TRIG 89 02/05/2021   Lab Results  Component Value Date   CHOLHDL 3.6 02/05/2021   No results found for: LDLDIRECT    Radiology: DG Shoulder Right  Result Date: 02/12/2021 CLINICAL DATA:  Right shoulder pain. EXAM: RIGHT SHOULDER - 2+ VIEW COMPARISON:  None. FINDINGS: Right shoulder is  located without a fracture. Visualized right ribs are intact. Normal alignment at the right [facility] joint. IMPRESSION: No acute abnormality to the right shoulder. Electronically Signed   By: Richarda Overlie M.D.   On: 02/12/2021 15:48   DG Abd 1 View  Result Date: 03/03/21 CLINICAL DATA:  Intubation EXAM: ABDOMEN - 1 VIEW COMPARISON:  02/06/2021 abdominal CT FINDINGS: The enteric tube tip and side-port reaches the stomach. Diffuse gas and stool distended colon, also seen on prior but progressed. No concerning mass effect or calcification. IMPRESSION: 1. Expected positioning of the enteric  tube. 2. Diffuse gas and stool distended colon which has progressed from admission CT, question colonic ileus. Electronically Signed   By: Marnee SpringJonathon  Watts M.D.   On: 02/08/2021 05:13   CT HEAD WO CONTRAST  Result Date: 01/20/2021 CLINICAL DATA:  Mental status change with unknown cause EXAM: CT HEAD WITHOUT CONTRAST TECHNIQUE: Contiguous axial images were obtained from the base of the skull through the vertex without intravenous contrast. COMPARISON:  None. FINDINGS: Brain: Low-density in the upper left cerebellum. Suspect cytotoxic edema in the left occipital lobe, although affected by streak artifact. No hemorrhage, hydrocephalus, or collection. No masslike findings. Vascular: Asymmetric density of the left PCA, especially on coronal reformats. Left PCA may be fetal type. Skull: Negative Sinuses/Orbits: Negative for acute finding. Chronic left maxillary sinus inflammation with sclerotic wall thickening, mucosal thickening, and atelectasis. Optic drusen. Other: A page has been placed to the ordering provider. IMPRESSION: Findings of acute left superior cerebellar and occipital infarcts. The left PCA appears hyperdense and may be thrombosed. Consider CTA. Electronically Signed   By: Marnee SpringJonathon  Watts M.D.   On: 02/11/2021 05:34   CT Angio Chest PE W and/or Wo Contrast  Result Date: 01/21/2021 CLINICAL DATA:  Nausea, diarrhea, high  prob PE suspected EXAM: CT ANGIOGRAPHY CHEST CT ABDOMEN AND PELVIS WITH CONTRAST TECHNIQUE: Multidetector CT imaging of the chest was performed using the standard protocol during bolus administration of intravenous contrast. Multiplanar CT image reconstructions and MIPs were obtained to evaluate the vascular anatomy. Multidetector CT imaging of the abdomen and pelvis was performed using the standard protocol during bolus administration of intravenous contrast. CONTRAST:  80mL OMNIPAQUE IOHEXOL 350 MG/ML SOLN COMPARISON:  None. FINDINGS: CTA CHEST FINDINGS Cardiovascular: Heart size normal. No pericardial effusion. Satisfactory opacification of pulmonary arteries noted, and there is no evidence of pulmonary emboli. Scattered coronary calcifications. Adequate contrast opacification of the thoracic aorta with no evidence of dissection, aneurysm, or stenosis. There is classic 3-vessel brachiocephalic arch anatomy without proximal stenosis. Moderate partially calcified plaque in the arch and descending thoracic segment. Mediastinum/Nodes: No mass or adenopathy. Lungs/Pleura: No significant pleural effusion. No pneumothorax. Focal infiltrate in the left upper lobe near the hilum. Subsegmental dependent atelectasis in the left lower lobe. Musculoskeletal: Anterior vertebral endplate spurring at multiple levels in the lower thoracic spine. Review of the MIP images confirms the above findings. CT ABDOMEN and PELVIS FINDINGS Hepatobiliary: No focal liver abnormality is seen. No gallstones, gallbladder wall thickening, or biliary dilatation. Pancreas: Unremarkable. No pancreatic ductal dilatation or surrounding inflammatory changes. Spleen: Normal in size without focal abnormality. Adrenals/Urinary Tract: Adrenal glands are unremarkable. Kidneys are normal, without renal calculi, focal lesion, or hydronephrosis. Bladder is distended. Stomach/Bowel: Stomach decompressed. Small bowel is nondilated. Appendix not discretely  identified. Moderate proximal colonic fecal material. There is mild gaseous distention of the transverse colon and a portion of the sigmoid segment. No significant diverticular disease nor evidence of obstruction. Vascular/Lymphatic: Moderate calcified aortoiliac plaque without aneurysm. Portal vein patent. No abdominal or pelvic adenopathy. Reproductive: Status post hysterectomy. No adnexal masses. Other: No ascites.  No free air. Musculoskeletal: No acute or significant osseous findings. Review of the MIP images confirms the above findings. IMPRESSION: 1. Negative for acute PE or thoracic aortic dissection. 2. Left upper lobe infiltrate possibly pneumonia. Followup PA and lateral chest X-ray is recommended in 3-4 weeks following trial of antibiotic therapy to ensure resolution and exclude underlying malignancy. 3. Mild colonic distension with proximal fecal material, no evidence of obstruction. Electronically Signed  By: Corlis Leak M.D.   On: February 14, 2021 14:42   CT ABDOMEN PELVIS W CONTRAST  Result Date: 02-14-21 CLINICAL DATA:  Nausea, diarrhea, high prob PE suspected EXAM: CT ANGIOGRAPHY CHEST CT ABDOMEN AND PELVIS WITH CONTRAST TECHNIQUE: Multidetector CT imaging of the chest was performed using the standard protocol during bolus administration of intravenous contrast. Multiplanar CT image reconstructions and MIPs were obtained to evaluate the vascular anatomy. Multidetector CT imaging of the abdomen and pelvis was performed using the standard protocol during bolus administration of intravenous contrast. CONTRAST:  18mL OMNIPAQUE IOHEXOL 350 MG/ML SOLN COMPARISON:  None. FINDINGS: CTA CHEST FINDINGS Cardiovascular: Heart size normal. No pericardial effusion. Satisfactory opacification of pulmonary arteries noted, and there is no evidence of pulmonary emboli. Scattered coronary calcifications. Adequate contrast opacification of the thoracic aorta with no evidence of dissection, aneurysm, or stenosis. There  is classic 3-vessel brachiocephalic arch anatomy without proximal stenosis. Moderate partially calcified plaque in the arch and descending thoracic segment. Mediastinum/Nodes: No mass or adenopathy. Lungs/Pleura: No significant pleural effusion. No pneumothorax. Focal infiltrate in the left upper lobe near the hilum. Subsegmental dependent atelectasis in the left lower lobe. Musculoskeletal: Anterior vertebral endplate spurring at multiple levels in the lower thoracic spine. Review of the MIP images confirms the above findings. CT ABDOMEN and PELVIS FINDINGS Hepatobiliary: No focal liver abnormality is seen. No gallstones, gallbladder wall thickening, or biliary dilatation. Pancreas: Unremarkable. No pancreatic ductal dilatation or surrounding inflammatory changes. Spleen: Normal in size without focal abnormality. Adrenals/Urinary Tract: Adrenal glands are unremarkable. Kidneys are normal, without renal calculi, focal lesion, or hydronephrosis. Bladder is distended. Stomach/Bowel: Stomach decompressed. Small bowel is nondilated. Appendix not discretely identified. Moderate proximal colonic fecal material. There is mild gaseous distention of the transverse colon and a portion of the sigmoid segment. No significant diverticular disease nor evidence of obstruction. Vascular/Lymphatic: Moderate calcified aortoiliac plaque without aneurysm. Portal vein patent. No abdominal or pelvic adenopathy. Reproductive: Status post hysterectomy. No adnexal masses. Other: No ascites.  No free air. Musculoskeletal: No acute or significant osseous findings. Review of the MIP images confirms the above findings. IMPRESSION: 1. Negative for acute PE or thoracic aortic dissection. 2. Left upper lobe infiltrate possibly pneumonia. Followup PA and lateral chest X-ray is recommended in 3-4 weeks following trial of antibiotic therapy to ensure resolution and exclude underlying malignancy. 3. Mild colonic distension with proximal fecal  material, no evidence of obstruction. Electronically Signed   By: Corlis Leak M.D.   On: 02/14/21 14:42   Portable Chest x-ray  Result Date: 01/22/2021 CLINICAL DATA:  Intubation EXAM: PORTABLE CHEST 1 VIEW COMPARISON:  01/28/2021 FINDINGS: Endotracheal tube with tip approximately 2 cm above the carina. The enteric tube tip and side-port reaches the stomach. Interstitial and hazy airspace type opacity bilaterally, not convincingly changed. No visible effusion or air leak. Normal heart size. Extensive artifact from EKG leads. IMPRESSION: New hardware in unremarkable position. Unchanged bilateral infiltrates. Electronically Signed   By: Marnee Spring M.D.   On: 02/05/2021 05:12   DG Chest Port 1 View  Result Date: 01/19/2021 CLINICAL DATA:  Difficulty breathing EXAM: PORTABLE CHEST 1 VIEW COMPARISON:  CT 02-14-21, radiograph 02/08/2021 FINDINGS: Increasing hazy interstitial and patchy opacities throughout the lungs, particular within the left upper lung when compared to most recent prior imaging. No visible pneumothorax or layering pleural effusion. Stable cardiomediastinal contours with a calcified aorta. No acute or worrisome osseous or soft tissue abnormality of the chest wall. IMPRESSION: Increasing hazy interstitial opacities throughout the  lungs concerning for worsening pneumonia and/or developing edema. Aortic Atherosclerosis (ICD10-I70.0). Electronically Signed   By: Kreg Shropshire M.D.   On: 02/03/2021 03:46   DG Chest Port 1 View  Result Date: 02/08/2021 CLINICAL DATA:  Sepsis, wall with dural, hypoxia EXAM: PORTABLE CHEST 1 VIEW COMPARISON:  01/26/2019 chest radiograph. 02-25-2021 chest CT angiogram. FINDINGS: Stable cardiomediastinal silhouette with top-normal heart size. No pneumothorax. No pleural effusion. Extensive patchy opacity throughout the right greater than left lungs, largely new. IMPRESSION: Extensive patchy opacity throughout the right greater than left lungs, largely new,  suspicious for multilobar pneumonia. Electronically Signed   By: Delbert Phenix M.D.   On: 02/08/2021 09:36   [place] EKG SITE RITE  Result Date: 02/08/2021 If Site Rite image not attached, placement could not be confirmed due to current cardiac rhythm.   EKG: Sinus tachycardia with ST elevation in leads II, III, aVF V3 V4 V5 V6 consistent with inferolateral STEMI  ASSESSMENT AND PLAN:   1.  Inferolateral STEMI, in patient on mechanical ventilator for aspiration and multifocal pneumonia, experiencing acute massive CVA with left PCA thrombosis and diffuse left ICA occlusion, with high likelihood of hemorrhagic conversion if anticoagulated for cardiac catheterization and PCI 2.  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  Aspiration / multifocal pneumonia  Recommendations  1.  After discussing directly with telemedicine neurologist, we will cancel code STEMI due to very high risk for hemorrhagic conversion with anticoagulation 2.  2D echocardiogram 3.  Further recommendations pending 2D echocardiogram results   Signed: Marcina Millard MD,PhD, [facility] 02/08/2021, 7:13 AM

## 2021-02-09 NOTE — Consult Note (Signed)
[facility] TeleNeurology Consult Services   Date of Service:   01/17/2021 06:02:56  Diagnosis:       M19.622 - Cerebrovascular accident (CVA) due to thrombosis of left posterior cerebral artery (HCCC)  Impression:      70yoF Hx of HTN, HLD, tobacco abuse, admitted for sepsis due to CAP, going through alcohol withdrawal while inpatient, found to be progressively somnolent required intubation. She was also found to have STEMI. CTH showed left PCA infarct with possible left PCA thrombosis.              STAT CTA shows left ICA and left PCA occlusion. Unable to obtain perfusion study due to lack of appropriate IV access. Case was discussed with cardiologist after CTA was obtained. Per cardiologist the risk of mortality from inferior STEMI is approx 10%. Given the evidence of large territory stroke, the amount of heparin required for cardiac stent is high risk for intracranial hemorrhage and likely high mortality. Cardiology and I agreed that risk of stent outweight the benefit at this time.              Check out to onsite neurologist Dr. Amada Jupiter at 8:05AM over the phone who will take over the SA.                 Metrics: Last Known Well: Unknown [facility] Notification Time: 01/17/2021 06:00:12 Stamp Time: 01/20/2021 06:02:56 Initial Response Time: 01/28/2021 06:04:44 Symptoms: unresponsiveness. NIHSS Start Assessment Time: 02/14/2021 06:14:22 Patient is not a candidate for Thrombolytic. Thrombolytic Medical Decision: 02/06/2021 06:07:23 Patient was not deemed candidate for Thrombolytic because of following reasons: Last Well Known Above 4.5 Hours.  CT head was reviewed and results were: Per radiology report: Findings of acute left superior cerebellar and occipital infarcts. The left PCA appears hyperdense and may be thrombosed.  Consulting physician Notified of Diagnostic Impression and Management Plan: 02/15/2021 06:39:52 Able to Reach 01/25/2021  06:39:52  Advanced Imaging: CTA Head and Neck Completed.  CTP Completed.  LVO:Yes  Discussed with NIR:Yes  Discussed with NIR Time:02/15/2021 07:21:16  Discussed with NIR Text:  obtain CT perfusion, Dr. Amada Jupiter will take over.   Our recommendations are outlined below.  Recommendations:        Stroke/Telemetry Floor       Neuro Checks       Bedside Swallow Eval       DVT Prophylaxis       IV Fluids, Normal Saline       Head of Bed 30 Degrees       Euglycemia and Avoid Hyperthermia (PRN Acetaminophen)       Aspirin per rectum       Antihypertensives PRN if Blood pressure is greater than 220/120 or there is a concern for End organ damage/contraindications for permissive HTN. If blood pressure is greater than 220/120 give labetalol PO or IV or Vasotec IV with a goal of 15% reduction in BP during the first 24 hours.  Routine Consultation with Inhouse Neurology for Follow up Care  Sign Out:       Discussed with Rapid Response Team    ------------------------------------------------------------------------------  History of Present Illness: Patient is a 70 year old Female.  Inpatient stroke alert was called for symptoms of unresponsiveness.  Patient was initally admitted on 7/19 for sepsis due to CAP. While receiving treatment in the hospital she was also going through alcohol withdrawal. Over the course of 7/23, patient has increase somnolence and lethargic. Last time she was able to wake  up and swallow pill was at 10:30pm of 7/23. Since then due to worsening somnolence, unresponsiveness, and concern for aspiration, patient was emergently intubated at 4:15, paralytics was not used at the time. CTH was obtained and showed acute left superior cerebellar and occipital infarcts, left PCA hyperdense, concern for thrombosis. PMH: HTN, depression/anxiety, hyponatremia, alcohol abuse, tobacco abuse.    Anticoagulant use:  No  Antiplatelet use: No  Allergies:   Reviewed Allergies Text: hydromorphone, cefaclor, tetracycline  NIHSS may not be reliable due to: Patient was intubated and paralyzed  Examination: BP(185/120), Pulse(108), 1A: Level of Consciousness - Postures or Unresponsive + 3 1B: Ask Month and Age - Could Not Answer Either Question Correctly + 2 1C: Blink Eyes & Squeeze Hands - Performs 0 Tasks + 2 2: Test Horizontal Extraocular Movements - Normal + 0 3: Test Visual Fields - No Visual Loss + 0 4: Test Facial Palsy (Use Grimace if Obtunded) - Normal symmetry + 0 5A: Test Left Arm Motor Drift - No Movement + 4 5B: Test Right Arm Motor Drift - No Movement + 4 6A: Test Left Leg Motor Drift - No Movement + 4 6B: Test Right Leg Motor Drift - No Movement + 4 7: Test Limb Ataxia (FNF/Heel-Shin) - No Ataxia + 0 8: Test Sensation - Normal; No sensory loss + 0 9: Test Language/Aphasia - Coma/Unresponsive + 3 10: Test Dysarthria - Intubated/Unable to Test + 0 11: Test Extinction/Inattention - No abnormality + 0  NIHSS Score: 26  NIHSS Free Text : Patient intubated, on propofol and precedex Breathing over the vent Weak corneal reflex Oculocephalic reflex intact Pupil 54mm bilaterally, nonreactive No withdrawal with sternal rub or noxious stimuli. Some facial grimacing appear symmetric. Weak gag, no cough  Pre-Morbid Modified Rankin Scale: 1 Points = No significant disability despite symptoms; able to carry out all usual duties and activities   Patient/Family was informed the Neurology Consult would occur via TeleHealth consult by way of interactive audio and video telecommunications and consented to receiving care in this manner.   Patient is being evaluated for possible acute neurologic impairment and high probability of imminent or life-threatening deterioration. I spent total of 90 minutes providing care to this patient, including time for face to face visit via telemedicine, review of medical records, imaging studies and discussion of  findings with providers, the patient and/or family.   Dr Larkin Ina   [facility] [phone]  Case [id number]

## 2021-02-09 NOTE — Progress Notes (Signed)
Secure chat with charge nurse.  States the pt had a CVC placed.  RN to cancel the PICC order.

## 2021-02-09 NOTE — Procedures (Signed)
Intubation Procedure Note  Amy Becker  [id number]  February 13, 1951  Date:02/09/2021  Time:4:19 AM   Provider Performing:Aayana Reinertsen L Rust-Chester    Procedure: Intubation (31500)  Indication(s) Respiratory Failure  Consent Discussed bedside with the patient's husband, Zelda Reames, who agreed with emergent intubation for airway protection.   Anesthesia Etomidate, Versed, and Fentanyl   Time Out Verified patient identification, verified procedure, site/side was marked, verified correct patient position, special equipment/implants available, medications/allergies/relevant history reviewed, required imaging and test results available.   Sterile Technique Usual hand hygeine, masks, and gloves were used   Procedure Description Patient positioned in bed supine.  Sedation given as noted above.  Patient was intubated with an 7.5 endotracheal tube at 25 using Glidescope.  View was Grade 1 full glottis .  Number of attempts was 1.  Colorimetric CO2 detector was consistent with tracheal placement.   Complications/Tolerance None; patient tolerated the procedure well. Chest X-ray is ordered to verify placement.   EBL Minimal   Specimen(s) None   Betsey Holiday, AGACNP-BC Acute Care Nurse Practitioner [facility]   [phone] / [phone] Please see Amion for pager details.

## 2021-02-09 NOTE — Progress Notes (Signed)
5997 CODE STROKE and CODE STEMI called on patient, refer to MD note

## 2021-02-09 NOTE — Consult Note (Addendum)
Neurology Consultation Reason for Consult: Stroke Referring Physician: Schertz, a  CC: Stroke  History is obtained from: Chart review  HPI: Amy Becker is a 70 y.o. female admitted with pneumonia, bacteremia with severe sepsis and metabolic encephalopathy who had a worsening respiratory state with concern for aspiration yesterday and was intubated.  She had no definite localizing signs when evaluated at 4 AM.  Subsequently head CT was performed given that she had had some confusion which demonstrates a left parietal infarct, and a CTA was obtained.  This demonstrates an occluded left ICA and she was taken back for perfusion.  Even on the CT angiogram, it was clear that the patient had dismal collateralization and the CT perfusion confirmed that she had infarcted the entire left hemisphere and also demonstrates an area of moderate sized infarct in the right parietal region.  She also is having an inferior MI as well.  LKW: Unclear due to encephalopathy on presentation, but I suspect this occurred in the wee hours of the morning tpa given?: no, unclear time of onset    ROS:  Unable to obtain due to altered mental status.   Past Medical History:  Diagnosis Date   Alcohol abuse    Hypertension    Hyponatremia 01/26/2019     Family History  Problem Relation Age of Onset   CAD Mother      Social History:  reports that she has been smoking cigarettes. She has a 40.00 pack-year smoking history. She has never used smokeless tobacco. She reports current alcohol use. She reports that she does not use drugs.   Exam: Current vital signs: BP (!) 92/46   Pulse (!) 105   Temp 98.1 F (36.7 C) (Axillary)   Resp (!) 22   Ht 5\' 1"  (1.549 m)   Wt 61.2 kg   SpO2 96%   BMI 25.51 kg/m  Vital signs in last 24 hours: Temp:  [98.1 F (36.7 C)-99.4 F (37.4 C)] 98.1 F (36.7 C) (07/24 0550) Pulse Rate:  [90-114] 105 (07/24 0600) Resp:  [20-32] 22 (07/24 0600) BP: (92-185)/(46-120)  92/46 (07/24 0600) SpO2:  [88 %-100 %] 96 % (07/24 0716) FiO2 (%):  [50 %-100 %] 50 % (07/24 0716)   Physical Exam  Constitutional: Appears well-developed and well-nourished.  Psych: Affect appropriate to situation Eyes: No scleral injection HENT: ET tube in place MSK: no joint deformities.  Cardiovascular: Normal rate and regular rhythm.  Respiratory: Ventilated GI: Soft.  No distension. There is no tenderness.  Skin: WDI  Neuro: Mental Status: Patient is obtunded, she does not open eyes or follow commands, she does grimace to noxious stimulation Cranial Nerves: II: She does not blink to threat from either side, her left pupil is reactive, her right pupil minimally reacts III,IV, VI: Eyes are midline V: VII: Corneals are absent Motor: She has minimal flexion versus withdrawal of the left arm and leg, no movement on the right Sensory: She grimaces to noxious stimulation on the left, but not right  Cerebellar: Does not perform   I have reviewed labs in epic and the results pertinent to this consultation are: Cr 1.45  I have reviewed the images obtained: CT-interval changes since 5 AM with worsening gray-white blurring and basal ganglia edema CT angio-ICA occlusion with fetal PCA CT perfusion massive left hemispheric infarct with right parietal infarct, total area of infarct 317 mL  Impression: 70 year old female with massive hemispheric infarct.  I suspect this occurred sometime overnight, but the complicating factor  of her metabolic encephalopathy was a significant confounder.  Given her dismal collateralization I would expect her to be a very rapid progressor on CT, and indeed between the first and second CT there is change.  Given the degree of infarct, she is not a candidate for any type of thrombectomy at this point.  I have discussed her dismal prognosis with her family, introducing options such as hyperosmolar therapy which I would not expect to improve her outcome.   After discussion with the family, her husband indicates she would not want to live in a severely debilitated state. I indicated that this would be the expected outcome if she were to survive.   Recommendations: 1) Would recommend comfort care based on husband's description that she does not want to survive in a debilitated state.  2) With active MI, recent DIC and given her age, I do not think she would be a good surgical candidate for decompressive hemicraniectomy.  3) If aggressive care continues, could consider starting 3% NS at 500cc/hr, though I doubt it will change outcome.   This patient is critically ill and at significant risk of neurological worsening, death and care requires constant monitoring of vital signs, hemodynamics,respiratory and cardiac monitoring, neurological assessment, discussion with family, other specialists and medical decision making of high complexity. I spent 40 minutes of neurocritical care time  in the care of  this patient. This was time spent independent of any time provided by nurse practitioner or PA.  Ritta Slot, MD [facility] [phone]  If 7pm- 7am, please page neurology on call as listed in AMION. 01/17/2021  2:01 PM

## 2021-02-09 NOTE — Progress Notes (Signed)
Messaged  MD concerning increased work of breathing and further decline in respiratory status.

## 2021-02-09 NOTE — Plan of Care (Signed)
The patient's family has asked that Ms. Matzek be considered for organ donation. I have asked for [facility] to be called to assess for potential organ donation.  [name], MD 02/02/2021 1:16 PM

## 2021-02-09 NOTE — Progress Notes (Addendum)
MEDICATION RELATED CONSULT NOTE  Pharmacy Consult for Hypertonic saline Indication: cerebral edema or impending cerebral herniation  Medical History: Past Medical History:  Diagnosis Date   Alcohol abuse    Hypertension    Hyponatremia 01/26/2019    Assessment: 70 yo female admitted with pneumonia, bacteremia with severe sepsis and metabolic encephalopathy who had a worsening respiratory state with concern for aspiration yesterday and was intubated. CTA was obtained and demonstrated an occluded left ICA. CT perfusion confirmed pt had infarcted left hemisphere and also demonstrated an area of moderate sized infarct in the right parietal region. Pharmacy has been consulted for hypertonic saline for cerebral edema. Na had trended up to 148 and is now 143.   Goals of Therapy: Na level 150-155 mEq/L  Plan:  3% hypertonic saline @50ml /hr Monitor Na q6h and notify MD if Na increases by 4 mEq/L or more in the first 2 hours or by 6 mEq/L or more in the first 4 hours will notify MD   , PharmD Clinical Pharmacist 01/18/2021,6:27 PM

## 2021-02-09 NOTE — Consult Note (Signed)
NAME:  Amy Becker, MRN:  [id number], DOB:  1951-01-27, LOS: 5 ADMISSION DATE:  02/14/2021, CONSULTATION DATE:  Feb 26, 2021 REFERRING MD:  Dr. Para March, CHIEF COMPLAINT:  Shoulder pain >Respiratory failure  History of Present Illness:  70 year old female presented to [facility] ED from home via EMS on 02/15/2021 with complaints of shoulder pain, worsening cough and diarrhea over the last 2 weeks.  Per ED documentation and the patient's husband she complained first of right shoulder, upper back & hip pain.  This was followed with a worsening cough and diarrhea.  The patient initially refused to seek medical treatment and deteriorated to the point of being a unable to get in and out of bed.  Eventually she relented to come to the ER for evaluation. ED course: Initial vitals: Afebrile at 97.8, RR 20, HR 79, hypertensive at 195/109 & SPO2 91% on room air. Significant labs: X-ray right shoulder-negative for fracture or dislocation, CT chest+ LUL pneumonia, CT abdomen/pelvis + colonic distention and proximal fecal matter without evidence of obstruction.  Hyponatremia-130, hypokalemia-2.9, non- AG metabolic acidosis-serum CO2 18, Cl- 97, direct bilirubin-0.4, indirect bilirubin-1.5, total bilirubin-1.9, troponin-67 > 69, PCT 1.74, leukocytosis at 15, lactic acidosis of 2.2> 1.6 Patient was admitted to MedSurg bed by [facility] service for treatment of sepsis related to CAP.  Hospital course: Patient received IV fluid resuscitation and empiric antibiotics for CAP treatment.  She received electrolyte replacement for derangements, and CIWA protocol for withdrawal symptoms.  Concern for septic arthritis vs gouty arthritis, infectious disease consulted.  Patient's blood cultures & urine culture positive for group B strep and chest x-ray showing bilateral infiltrates. Antibiotics broadened to ceftriaxone/azithromycin & Flagyl.  Per nursing report patient's mentation has been slowly declining during hospitalization.  Overnight on  02/08/2021-2021/02/26, patient had episodes of agitation and restlessness.  She received a small dose of IV Ativan per CIWA protocol.  Nursing then noticed that the patient to be tachypneic with gurgled breathing.  TRH upon bedside assessment concerned with patient's work of breathing and somnolence, PCCM called bedside to assess patient.  Patient not responsive to verbal stimulation, tachypneic with audible secretions in airway.  Respiratory therapy deep suctioned copious thick secretions without improvement.  Highly suspicious of acute aspiration as the patient is unable to protect her airway. Decision was made to emergently transfer to ICU for rapid sequence intubation and mechanical ventilation.   Pertinent  Medical History  Tobacco use - 50 pack year history ETOH abuse - 1-1.5 bottles of wine daily HTN HLD  Significant Hospital Events: Including procedures, antibiotic start and stop dates in addition to other pertinent events   02/16/2021-patient admitted to MedSurg inpatient with sepsis due to community-acquired pneumonia 02/05/2021-blood culture positive for group B Streptococcus, ID consulted 02/06/2021 ophthalmology consulted for floaters in both eyes > Valsalva retinopathy discovered with mild breakthrough vitreous hemorrhage bilaterally February 26, 2021-overnight patient transferred emergently to ICU due to worsening respiratory status in the setting of possible aspiration, inability to protect airway > patient emergently intubated requiring mechanical ventilation  Interim History / Subjective:  Patient's husband, Amy Becker was bedside.  Obtained verbal consent for emergent procedure and patient transferred to ICU for RSI.  Objective   Blood pressure 135/69, pulse (!) 108, temperature 98.2 F (36.8 C), resp. rate (!) 32, height 5\' 1"  (1.549 m), weight 61.2 kg, SpO2 98 %.    Vent Mode: PRVC FiO2 (%):  [90 %-100 %] 100 % Set Rate:  [16 bmp] 16 bmp Vt Set:  [450 mL] 450 mL PEEP:  [  5 cmH20] 5  cmH20 Plateau Pressure:  [16 cmH20] 16 cmH20   Intake/Output Summary (Last 24 hours) at 01/29/2021 0500 Last data filed at 02/08/2021 7782 Gross per 24 hour  Intake 1050 ml  Output 2650 ml  Net -1600 ml   Filed Weights   02/15/2021 1043  Weight: 61.2 kg    Examination: General: Adult female, critically ill, lying in bed- tachypneic/labored breathing HEENT: MM pink/dry, anicteric, atraumatic, neck supple Neuro: unresponsive RASS-4, unable to follow commands, pupils not reactive +4, corneal reflex impaired CV: s1s2 RRR, ST on monitor, no r/m/g Pulm: Regular, labored on NRB @ 15 L , breath sounds coarse-BUL & diminished-BLL GI: soft, rounded, bs x 4 Skin: mild mottling noted in extremities, scattered ecchymosis Extremities: warm/dry, pulses + 2 R/P, no edema noted  Resolved Hospital Problem list     Assessment & Plan:  Acute Hypoxic Respiratory Failure secondary to CAP in the setting of suspected aspiration PMHx: tobacco user 50 yr pack history - Ventilator settings: PRVC  8 mL/kg, 100% FiO2, 5 PEEP, continue ventilator support & lung protective strategies - Wean PEEP & FiO2 as tolerated, maintain SpO2 > 90%5 - Head of bed elevated 30 degrees, VAP protocol in place - Plateau pressures less than 30 cm H20  - Intermittent chest x-ray & ABG PRN5 - Daily WUA with SBT as tolerated  - Ensure adequate pulmonary hygiene  - F/u cultures, trend PCT - Continue CAP/Aspiration Pna coverage ceftriaxone/azithromycin/flagyl - holding off on steroids in the setting of severe leukocytosis - scheduled bronchodilators - PAD protocol in place: continue Fentanyl IVP & Precedex drip  Sepsis without septic shock due to Group B strep bacteremia/UTI & CAP Lactic: 2.2 > 1.6 > 6.0, Baseline PCT: 1.74 > 0.89 - ID following, appreciate input - f/u cultures, trend lactic/ PCT - Daily CBC - monitor WBC/ fever curve - IV antibiotics: ceftriaxone, azithromycin & flagyl - IVF hydration as needed: 500 mL  bolus - Initiate vasopressors to maintain MAP< 65, norepinephrine - Strict I/O's: alert provider if UOP < 0.5 mL/kg/hr  Acute encephalopathy in the setting of ETOH withdrawal & metabolic encephalopathy Acute Ischemic Stroke PMHx: ETOH abuse - supportive care - CTH STAT - Neuro checks Q 4 - CIWA protocol Q 4, versed IV PRN & precedex drip Addendum: CTH without contrast demonstrated 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 LEFT PCA appears hyperdense and may be thrombosed.  Tele-neurologist assessed imaging, patient NOT a candidate for TPA due to unknown LKW. CTa & perfusion scan recommended as long as it did not interfere with CODE STEMI intervention.  CODE STEMI : EKG demonstrating NEW Inferolateral STE with troponin elevation  69 > 14,715 PMHx: smoker, HLD, HTN Spoke with Dr. Darrold Junker, awaiting decision on further intervention - Trend troponins  - Echocardiogram ordered - continuous cardiac monitoring - Cardiology consulted, appreciate input  Acute Kidney Injury  Non-AG Metabolic Acidosis Hyponatremia Hyokalemia Baseline Cr: 0.66, Cr on admission:1.45 - Strict I/O's: alert provider if UOP < 0.5 mL/kg/hr - Daily BMP, replace electrolytes PRN - Avoid nephrotoxic agents as able, ensure adequate renal perfusion  Best Practice (right click and "Reselect all SmartList Selections" daily)  Diet/type: NPO w/ meds via tube DVT prophylaxis: SCD GI prophylaxis: PPI Lines: N/A, PICC placement ordered for 7/24 Foley:  Yes, and it is still needed Code Status:  full code Last date of multidisciplinary goals of care discussion 02/15/2021  Labs   CBC: Recent Labs  Lab 01/23/2021 1045 02/05/21 0436 02/05/21 1143 02/07/21 0418 02/08/21  0823  WBC 15.0* 20.3* 15.9* 31.6* 35.9*  NEUTROABS  --   --   --   --  31.6*  HGB 15.9* 13.6 12.7 13.2 14.2  HCT 44.6 36.5 34.3* 36.2 38.8  MCV 89.6 87.1 86.8 85.2 86.8  PLT 53* 49* 51* 124* 165    Basic Metabolic Panel: Recent Labs  Lab  01/28/2021 1440 01/19/2021 1759 02/05/21 0436 02/05/21 1143 02/07/21 0418 02/07/21 2118 02/08/21 0823  NA 130*  --  132* 133* 142  --  146*  K 2.9*  --  3.8 3.4* 2.9* 2.9* 2.9*  CL 97*  --  99 104 116*  --  113*  CO2 18*  --  18* 18* 17*  --  21*  GLUCOSE 80  --  73 104* 103*  --  111*  BUN 20  --  20 21 21   --  16  CREATININE 0.66  --  0.54 0.56 0.61  --  0.57  CALCIUM 8.8*  --  8.6* 8.5* 8.7*  --  9.2  MG 1.7  --  2.2 1.9 2.0  --   --   PHOS  --  3.7  --  3.4  --   --   --    GFR: Estimated Creatinine Clearance: 54.2 mL/min (by C-G formula based on SCr of 0.57 mg/dL). Recent Labs  Lab 01/27/2021 1337 02/02/2021 1440 01/17/2021 1622 02/05/21 0436 02/05/21 1143 02/07/21 0418 02/08/21 0823  PROCALCITON  --  1.74  --   --   --   --   --   WBC  --   --   --  20.3* 15.9* 31.6* 35.9*  LATICACIDVEN 2.2*  --  1.6  --   --   --   --     Liver Function Tests: Recent Labs  Lab 02/03/2021 1440 02/05/21 1143  AST 34 36  ALT 11 12  ALKPHOS 101 76  BILITOT 1.9* 1.5*  PROT 6.0* 5.0*  ALBUMIN 2.6* 2.1*   No results for input(s): LIPASE, AMYLASE in the last 168 hours. No results for input(s): AMMONIA in the last 168 hours.  ABG    Component Value Date/Time   PHART 7.46 (H) 01/26/2021 0304   PCO2ART 19 (LL) 02/07/2021 0304   PO2ART 59 (L) 01/20/2021 0304   HCO3 13.5 (L) 02/14/2021 0304   ACIDBASEDEF 7.8 (H) 02/01/2021 0304   O2SAT 91.7 01/21/2021 0304     Coagulation Profile: Recent Labs  Lab 02/07/21 0418  INR 1.2    Cardiac Enzymes: No results for input(s): CKTOTAL, CKMB, CKMBINDEX, TROPONINI in the last 168 hours.  HbA1C: Hgb A1c MFr Bld  Date/Time Value Ref Range Status  01/25/2021 05:59 PM 5.7 (H) 4.8 - 5.6 % Final    Comment:    (NOTE) Pre diabetes:          5.7%-6.4%  Diabetes:              >6.4%  Glycemic control for   <7.0% adults with diabetes     CBG: Recent Labs  Lab 02/12/2021 0405  GLUCAP 93    Review of Systems:   Patient unable to  participate in interview  Past Medical History:  She,  has a past medical history of Alcohol abuse, Hypertension, and Hyponatremia (01/26/2019).   Surgical History:   Past Surgical History:  Procedure Laterality Date   ABDOMINAL HYSTERECTOMY       Social History:   reports that she has been smoking cigarettes. She has a 40.00  pack-year smoking history. She has never used smokeless tobacco. She reports current alcohol use. She reports that she does not use drugs.   Family History:  Her family history includes CAD in her mother.   Allergies Allergies  Allergen Reactions   Hydromorphone Hives   Cefaclor Hives   Tetracycline Rash     Home Medications  Prior to Admission medications   Medication Sig Start Date End Date Taking? Authorizing Provider  atorvastatin (LIPITOR) 20 MG tablet Take 20 mg by mouth daily. 10/22/20  Yes [provider]  cyclobenzaprine (FLEXERIL) 5 MG tablet Take 5 mg by mouth 2 (two) times daily. 01/01/21  Yes [provider]  diclofenac (VOLTAREN) 75 MG EC tablet Take 75 mg by mouth 2 (two) times daily with a meal.   Yes [provider]  gabapentin (NEURONTIN) 600 MG tablet Take 600 mg by mouth 2 (two) times a day.   Yes [provider]  hydrOXYzine (ATARAX/VISTARIL) 25 MG tablet Take 25 mg by mouth 3 (three) times daily. 01/28/21  Yes [provider]  lansoprazole (PREVACID) 30 MG capsule Take 30 mg by mouth daily. 12/27/20  Yes [provider]  lisinopril (ZESTRIL) 40 MG tablet Take 1 tablet (40 mg total) by mouth daily. 01/30/19 02/11/21 Yes Kyle, Tyrone A, DO  LORazepam (ATIVAN) 1 MG tablet Take 1 mg by mouth daily as needed for anxiety.   Yes [provider]  metoprolol succinate (TOPROL-XL) 25 MG 24 hr tablet Take 25 mg by mouth daily. 01/28/21  Yes [provider]  mirtazapine (REMERON) 30 MG tablet Take 30 mg by mouth at bedtime. 01/01/21  Yes [provider]  sertraline (ZOLOFT)  100 MG tablet Take 100 mg by mouth daily.   Yes [provider]  traZODone (DESYREL) 100 MG tablet Take 100 mg by mouth at bedtime.   Yes [provider]  calcium carbonate (TUMS - DOSED IN MG ELEMENTAL CALCIUM) 500 MG chewable tablet Chew 2 tablets (400 mg of elemental calcium total) by mouth 3 (three) times daily as needed for indigestion or heartburn. 01/29/19   Margie EgeKyle, Tyrone A, DO  estradiol (CLIMARA - DOSED IN MG/24 HR) 0.0375 mg/24hr patch Place 0.0375 mg onto the skin once a week. 01/23/21   [provider]  folic acid (FOLVITE) 1 MG tablet Take 1 tablet (1 mg total) by mouth daily. Patient not taking: No sig reported 01/30/19   Margie EgeKyle, Tyrone A, DO  Multiple Vitamin (MULTIVITAMIN WITH MINERALS) TABS tablet Take 1 tablet by mouth daily. Patient not taking: No sig reported 01/30/19   Teddy SpikeKyle, Tyrone A, DO     Critical care time: 60 minutes       Betsey HolidayBritton Lee Rust-Chester, AGACNP-BC Acute Care Nurse Practitioner [facility]   [phone] / [phone] Please see Amion for pager details.

## 2021-02-09 NOTE — Progress Notes (Signed)
Chaplain received referral from previous chaplain Aggie Cosier to support family during time of difficult decisions.  Provided active listening, emotional and spiritual support, drinks and a Bible.  Chaplain is available for ongoing support throughout the day as needed.  Please call if requested or if support would be helpful.    Belia Heman, [place] Pager: [phone]     02/05/2021 1000  Clinical Encounter Type  Visited With Family;Patient  Visit Type Initial;Critical Care  Referral From Chaplain  Consult/Referral To Chaplain  Spiritual Encounters  Spiritual Needs Sacred text  Stress Factors  Patient Stress Factors Major life changes  Family Stress Factors Exhausted;Loss of control;Major life changes

## 2021-02-10 DIAGNOSIS — J9601 Acute respiratory failure with hypoxia: Secondary | ICD-10-CM | POA: Diagnosis not present

## 2021-02-10 DIAGNOSIS — G9341 Metabolic encephalopathy: Secondary | ICD-10-CM

## 2021-02-10 DIAGNOSIS — I63232 Cerebral infarction due to unspecified occlusion or stenosis of left carotid arteries: Secondary | ICD-10-CM | POA: Diagnosis not present

## 2021-02-10 LAB — CBC
HCT: 31.9 % — ABNORMAL LOW (ref 36.0–46.0)
Hemoglobin: 11.4 g/dL — ABNORMAL LOW (ref 12.0–15.0)
MCH: 32.1 pg (ref 26.0–34.0)
MCHC: 35.7 g/dL (ref 30.0–36.0)
MCV: 89.9 fL (ref 80.0–100.0)
Platelets: 20 10*3/uL — CL (ref 150–400)
RBC: 3.55 MIL/uL — ABNORMAL LOW (ref 3.87–5.11)
RDW: 16.1 % — ABNORMAL HIGH (ref 11.5–15.5)
WBC: 36.2 10*3/uL — ABNORMAL HIGH (ref 4.0–10.5)
nRBC: 0.42 % — ABNORMAL HIGH (ref 0.0–0.2)

## 2021-02-10 LAB — SODIUM
Sodium: 147 mmol/L — ABNORMAL HIGH (ref 135–145)
Sodium: 143 mmol/L (ref 135–145)
Sodium: 148 mmol/L — ABNORMAL HIGH (ref 135–145)

## 2021-02-10 LAB — BLOOD GAS, ARTERIAL
Acid-base deficit: 15.7 mmol/L — ABNORMAL HIGH (ref 0.0–2.0)
Bicarbonate: 10.9 mmol/L — ABNORMAL LOW (ref 20.0–28.0)
FIO2: 0.35
MECHVT: 450 mL
O2 Saturation: 97.7 %
PEEP: 5 cmH2O
Patient temperature: 37
RATE: 24 resp/min
pCO2 arterial: 28 mmHg — ABNORMAL LOW (ref 32.0–48.0)
pH, Arterial: 7.2 — ABNORMAL LOW (ref 7.350–7.450)
pO2, Arterial: 120 mmHg — ABNORMAL HIGH (ref 83.0–108.0)

## 2021-02-10 LAB — BASIC METABOLIC PANEL
Anion gap: 14 (ref 5–15)
BUN: 55 mg/dL — ABNORMAL HIGH (ref 8–23)
CO2: 12 mmol/L — ABNORMAL LOW (ref 22–32)
Calcium: 6.8 mg/dL — ABNORMAL LOW (ref 8.9–10.3)
Chloride: 122 mmol/L — ABNORMAL HIGH (ref 98–111)
Creatinine, Ser: 2.44 mg/dL — ABNORMAL HIGH (ref 0.44–1.00)
GFR, Estimated: 21 mL/min — ABNORMAL LOW (ref >60)
Glucose, Bld: 156 mg/dL — ABNORMAL HIGH (ref 70–99)
Potassium: 4.7 mmol/L (ref 3.5–5.1)
Sodium: 148 mmol/L — ABNORMAL HIGH (ref 135–145)

## 2021-02-10 LAB — D-DIMER, QUANTITATIVE: D-Dimer, Quant: >20 ug/mL-FEU — ABNORMAL HIGH (ref 0.00–0.50)

## 2021-02-10 LAB — BPAM CRYOPRECIPITATE
Blood Product Expiration Date: 202207242023
ISSUE DATE / TIME: 202207241449
Unit Type and Rh: 6200

## 2021-02-10 LAB — APTT: aPTT: 83 seconds — ABNORMAL HIGH (ref 24–36)

## 2021-02-10 LAB — TRIGLYCERIDES: Triglycerides: 153 mg/dL — ABNORMAL HIGH (ref <150)

## 2021-02-10 LAB — PROTIME-INR
INR: 6.2 (ref 0.8–1.2)
Prothrombin Time: 54.9 seconds — ABNORMAL HIGH (ref 11.4–15.2)

## 2021-02-10 LAB — PREPARE CRYOPRECIPITATE: Unit division: 0

## 2021-02-10 LAB — FIBRINOGEN: Fibrinogen: 100 mg/dL — ABNORMAL LOW (ref 210–475)

## 2021-02-10 LAB — PHOSPHORUS: Phosphorus: 6.8 mg/dL — ABNORMAL HIGH (ref 2.5–4.6)

## 2021-02-10 LAB — GLUCOSE, CAPILLARY
Glucose-Capillary: 154 mg/dL — ABNORMAL HIGH (ref 70–99)
Glucose-Capillary: <10 mg/dL — CL (ref 70–99)

## 2021-02-10 LAB — PATHOLOGIST SMEAR REVIEW

## 2021-02-10 LAB — MAGNESIUM: Magnesium: 2.1 mg/dL (ref 1.7–2.4)

## 2021-02-10 MED ORDER — SODIUM BICARBONATE 8.4 % IV SOLN
50.0000 meq | Freq: Once | INTRAVENOUS | Status: AC
  Administered 2021-02-10: 50 meq via INTRAVENOUS

## 2021-02-10 MED ORDER — SODIUM BICARBONATE 8.4 % IV SOLN
INTRAVENOUS | Status: AC
  Administered 2021-02-10: 50 meq via INTRAVENOUS
  Filled 2021-02-10: qty 50

## 2021-02-10 MED ORDER — SODIUM CHLORIDE 0.9% IV SOLUTION: Freq: Once | INTRAVENOUS | Status: DC

## 2021-02-11 LAB — CULTURE, RESPIRATORY W GRAM STAIN: Culture: NORMAL

## 2021-02-11 LAB — CULTURE, BLOOD (ROUTINE X 2)
Culture: NO GROWTH
Culture: NO GROWTH
Special Requests: ADEQUATE

## 2021-02-12 LAB — BPAM FFP
Blood Product Expiration Date: 202207302359
ISSUE DATE / TIME: 202207270809
Unit Type and Rh: 6200

## 2021-02-12 LAB — PREPARE FRESH FROZEN PLASMA: Unit division: 0

## 2021-02-13 LAB — GLUCOSE, CAPILLARY: Glucose-Capillary: 18 mg/dL — CL (ref 70–99)

## 2021-02-14 LAB — CULTURE, BLOOD (ROUTINE X 2)
Culture: NO GROWTH
Culture: NO GROWTH
Special Requests: ADEQUATE
Special Requests: ADEQUATE

## 2021-02-14 LAB — ADAMTS13 ANTIBODY: ADAMTS13 Antibody: 2 Units/mL (ref <12)

## 2021-02-14 LAB — ADAMTS13 ACTIVITY: Adamts 13 Activity: 29.1 % — CL (ref >66.8)

## 2021-02-17 NOTE — Progress Notes (Signed)
   2021-02-26 0835  Clinical Encounter Type  Visited With Patient;Family  Visit Type Initial;Spiritual support;Social support;Critical Care  Referral From Nurse  Consult/Referral To Chaplain  Stress Factors  Patient Stress Factors Major life changes  Family Stress Factors Exhausted;Health changes;Loss of control;Major life changes   Chaplain Curley Spice responded to an End-of-life page. When the Chaplain arrived in the room the family (Husband, 2 sons, and PT sister) was at the bedside in deep anguish. Chaplain offered emotional and spiritual support, after the PT's son expressed, they were all Christians. Chaplain made space for storytelling and connected with the reality that they all lived in [place]. Son spoke of being at "peace because he has no doubt his mother is going to heaven." Hospitality, prayer, and compassionate presence were all a part of this visit. Chaplain will try to follow back up if anything changes in Mrs. Comptons condition.

## 2021-02-17 NOTE — Progress Notes (Signed)
Chaplain offered prayer shawl to pt's younger son, Michele Mcalpine, who was bedside. He accepted.  Please contact as needed for ongoing support.  Belia Heman, Chaplain Pager:  [phone]

## 2021-02-17 NOTE — Progress Notes (Signed)
Chaplain Maggie made initial visit with patient and family at bedside. Room was made for storytelling, reading psalm 23 and prayer. Family appreciate support and care at end of life. Chaplain available for continued support throughout the day.

## 2021-02-17 NOTE — Progress Notes (Signed)
NAME:  Amy Becker, MRN:  [id number], DOB:  04-24-1951, LOS: 6 ADMISSION DATE:  02/13/2021, CONSULTATION DATE:  01/26/2021 REFERRING MD:  Dr. Damita Dunnings, CHIEF COMPLAINT:  Shoulder pain >Respiratory failure  History of Present Illness:  70 year old female presented to [facility] ED from home via EMS on 01/23/2021 with complaints of shoulder pain, worsening cough and diarrhea over the last 2 weeks.  Per ED documentation and the patient's husband she complained first of right shoulder, upper back & hip pain.  This was followed with a worsening cough and diarrhea.  The patient initially refused to seek medical treatment and deteriorated to the point of being a unable to get in and out of bed.  Eventually she relented to come to the ER for evaluation. ED course: Initial vitals: Afebrile at 97.8, RR 20, HR 79, hypertensive at 195/109 & SPO2 91% on room air. Significant labs: X-ray right shoulder-negative for fracture or dislocation, CT chest+ LUL pneumonia, CT abdomen/pelvis + colonic distention and proximal fecal matter without evidence of obstruction.  Hyponatremia-130, NUUVOZDGUYQ-0.3, non- AG metabolic acidosis-serum CO2 18, Cl- 97, direct bilirubin-0.4, indirect bilirubin-1.5, total bilirubin-1.9, troponin-67 > 69, PCT 1.74, leukocytosis at 15, lactic acidosis of 2.2> 1.6 Patient was admitted to [place] bed by [facility] service for treatment of sepsis related to CAP.  Hospital course: Patient received IV fluid resuscitation and empiric antibiotics for CAP treatment.  She received electrolyte replacement for derangements, and CIWA protocol for withdrawal symptoms.  Concern for septic arthritis vs gouty arthritis, infectious disease consulted.  Patient's blood cultures & urine culture positive for group B strep and chest x-ray showing bilateral infiltrates. Antibiotics broadened to ceftriaxone/azithromycin & Flagyl.  Per nursing report patient's mentation has been slowly declining during hospitalization.  Overnight on  02/08/2021-02/02/2021, patient had episodes of agitation and restlessness.  She received a small dose of IV Ativan per CIWA protocol.  Nursing then noticed that the patient to be tachypneic with gurgled breathing.  TRH upon bedside assessment concerned with patient's work of breathing and somnolence, PCCM called bedside to assess patient.  Patient not responsive to verbal stimulation, tachypneic with audible secretions in airway.  Respiratory therapy deep suctioned copious thick secretions without improvement.  Highly suspicious of acute aspiration as the patient is unable to protect her airway. Decision was made to emergently transfer to ICU for rapid sequence intubation and mechanical ventilation.    Pertinent  Medical History  Tobacco use - 50 pack year history ETOH abuse - 1-1.5 bottles of wine daily HTN HLD  Significant Hospital Events: Including procedures, antibiotic start and stop dates in addition to other pertinent events   02/15/2021-patient admitted to [place] inpatient with sepsis due to community-acquired pneumonia 02/05/2021-blood culture positive for group B Streptococcus, ID consulted 02/06/2021 ophthalmology consulted for floaters in both eyes > Valsalva retinopathy discovered with mild breakthrough vitreous hemorrhage bilaterally 01/31/2021-overnight patient transferred emergently to ICU due to worsening respiratory status in the setting of possible aspiration, inability to protect airway > patient emergently intubated requiring mechanical ventilation March 04, 2021 family desires to transition patient to comfort measures  Interim History / Subjective:  Patient is unresponsive, intubated and mechanically ventilated.  Despite pressors blood pressure continues to decline.  Patient's husband, 2 sons and sister-in-law at bedside.  They are wanting to transition the patient to comfort measures but not to extubate until later today.  I did discuss the process and how patient may "declare herself"  prior to extubation.  They are aware.  They have requested chaplain to come  at the bedside.  Objective   Blood pressure (!) 84/59, pulse (!) 102, temperature (!) 95.72 F (35.4 C), resp. rate (!) 24, height $RemoveBe'5\' 1"'ttcGoILuh$  (1.549 m), weight 65 kg, SpO2 90 %.    Vent Mode: Other (Comment) FiO2 (%):  [28 %-50 %] 35 % Set Rate:  [18 bmp-30 bmp] 24 bmp Vt Set:  [300 mL-450 mL] 450 mL PEEP:  [5 cmH20] 5 cmH20 Plateau Pressure:  [17 cmH20-20 cmH20] 20 cmH20   Intake/Output Summary (Last 24 hours) at 02-12-21 0841 Last data filed at 2021/02/12 0400 Gross per 24 hour  Intake 4207.05 ml  Output 200 ml  Net 4007.05 ml    Filed Weights   01/23/2021 1043 Feb 12, 2021 0500  Weight: 61.2 kg 65 kg    Examination: General: Adult female, chronically on critically ill appearing, lying in bed-intubated, mechanically ventilated. 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  PMHx: tobacco user 50 yr pack history - Ventilator settings: PRVC  8 mL/kg, 100% FiO2, 5 PEEP, continue ventilator support & lung protective strategies - Wean PEEP & FiO2 as tolerated, maintain SpO2 > 90%5 - Head of bed elevated 30 degrees, VAP protocol in place - Plateau pressures less than 30 cm H20  - We will proceed to transition patient to comfort with plan for compassionate extubation  Sepsis without septic shock due to Group B strep bacteremia/UTI &  CAP Lactic: 2.2 > 1.6 > 6.0, Baseline PCT: 1.74 > 0.89 Patient will be transition to comfort  Acute encephalopathy in the setting of ETOH withdrawal & metabolic encephalopathy 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 LEFT PCA without functional enhancement .  Diffuse left ICA occlusion with no enhancing left MCA or ACA vessels.  STEMI EKG demonstrating NEW Inferolateral STE with troponin elevation  69 > 14,715 PMHx: smoker, HLD, HTN Patient not a candidate for further intervention Echocardiogram showed wall motion abnormalities  Acute Kidney Injury  Non-AG Metabolic Acidosis Hyponatremia Hyokalemia Baseline Cr: 0.66, Cr on admission:1.45 - Strict I/O's: alert provider if UOP < 0.5 mL/kg/hr - Daily BMP, replace electrolytes PRN - Avoid nephrotoxic agents as able, ensure adequate renal perfusion  Best Practice (right click and "Reselect all SmartList Selections" daily)  Diet/type: NPO w/ meds via tube DVT prophylaxis: SCD GI prophylaxis: PPI Lines: N/A, PICC placement ordered for 7/24 Foley:  Yes, and it is still needed Code Status: DNR/Comfort measures Last date of multidisciplinary goals of care discussion 02/12/21, participating MD, patient's husband, 2 sons.  Labs   CBC: Recent Labs  Lab 02/07/21 0418 02/08/21 0823 01/17/2021 0509 02/06/2021 1730 12-Feb-2021 0323  WBC 31.6* 35.9* 34.0* 41.9* 36.2*  NEUTROABS  --  31.6* 30.2*  --   --   HGB 13.2 14.2 13.3 12.4 11.4*  HCT 36.2 38.8 36.4 34.6* 31.9*  MCV 85.2 86.8 85.4 87.2 89.9  PLT 124* 165 PLATELET  CLUMPS NOTED ON SMEAR, UNABLE TO ESTIMATE 26* 20*     Basic Metabolic Panel: Recent Labs  Lab 01/21/2021 1440 01/26/2021 1759 02/05/21 0436 02/05/21 1143 02/07/21 0418 02/07/21 2118 02/08/21 0823 01/22/2021 0509 02/11/2021 1730 01/29/2021 2119 02/11/2021 2232 01/19/2021 2333 March 10, 2021 0323  Mar 10, 2021 0658  NA 130*  --  132* 133* 142  --  146* 148* 143 143 143 147* 148* 148*  K 2.9*  --  3.8 3.4* 2.9* 2.9* 2.9* 4.3 4.5  --   --   --  4.7  --   CL 97*  --  99 104 116*  --  113* 118* 116*  --   --   --  122*  --   CO2 18*  --  18* 18* 17*  --  21* 16* 17*  --   --   --  12*  --   GLUCOSE 80  --  73 104* 103*  --  111* 114* 110*  --   --   --  156*  --   BUN 20  --  $R'20 21 21  'ZH$ --  16 34* 54*  --   --   --  55*  --   CREATININE 0.66  --  0.54 0.56 0.61  --  0.57 1.45* 2.13*  --   --   --  2.44*  --   CALCIUM 8.8*  --  8.6* 8.5* 8.7*  --  9.2 8.6* 7.8*  --   --   --  6.8*  --   MG 1.7  --  2.2 1.9 2.0  --   --   --   --   --   --   --  2.1  --   PHOS  --  3.7  --  3.4  --   --   --   --   --   --   --   --  6.8*  --     GFR: Estimated Creatinine Clearance: 18.3 mL/min (A) (by C-G formula based on SCr of 2.44 mg/dL (H)). Recent Labs  Lab 01/31/2021 1440 02/07/2021 1622 02/08/21 0823 02/07/2021 0509 01/21/2021 0800 01/22/2021 1156 01/26/2021 1321 02/02/2021 1730 03/10/21 0323  PROCALCITON 1.74  --   --  0.89  --   --   --   --   --   WBC  --    < > 35.9* 34.0*  --   --   --  41.9* 36.2*  LATICACIDVEN  --    < >  --  6.0* 3.5* 2.9* 3.2*  --   --    < > = values in this interval not displayed.     Liver Function Tests: Recent Labs  Lab 02/08/2021 1440 02/05/21 1143 02/01/2021 0509  AST 34 36 180*  ALT $Re'11 12 24  'Mmv$ ALKPHOS 101 76 192*  BILITOT 1.9* 1.5* 1.7*  PROT 6.0* 5.0* 5.2*  ALBUMIN 2.6* 2.1* 1.9*    No results for input(s): LIPASE, AMYLASE in the last 168 hours. No results for input(s): AMMONIA in the last 168 hours.  ABG    Component Value Date/Time   PHART 7.20 (L) 2021/03/10 0500   PCO2ART 28 (L) 03/10/2021 0500   PO2ART 120 (H) 03/10/2021 0500   HCO3 10.9 (L) 2021-03-10 0500   ACIDBASEDEF 15.7 (H) 2021-03-10 0500   O2SAT 97.7 03/10/2021 0500      Coagulation Profile: Recent Labs  Lab 02/07/21 0418 02/11/2021 0930 02/04/2021 1730 03/10/21 0323  INR 1.2 2.8*  2.5*  6.2*     Cardiac Enzymes: No results for input(s): CKTOTAL, CKMB, CKMBINDEX, TROPONINI in the last 168 hours.  HbA1C: Hgb A1c MFr Bld  Date/Time Value Ref Range Status  02/01/2021 05:59 PM 5.7 (H) 4.8 - 5.6 % Final    Comment:    (NOTE) Pre diabetes:          5.7%-6.4%  Diabetes:              >6.4%  Glycemic control for   <7.0% adults with diabetes     CBG: Recent Labs  Lab 02/15/2021 2120 01/30/2021 2235 02/14/2021 2336 02/17/2021 0332 02/17/2021 0835  GLUCAP 36* 273* 206* 154* <10*     Review of Systems:   Patient unable to participate in interview  Allergies Allergies  Allergen Reactions   Hydromorphone Hives   Cefaclor Hives   Tetracycline Rash    Scheduled Meds:  sodium chloride   Intravenous Once   atorvastatin  80 mg Per Tube Daily   chlorhexidine gluconate (MEDLINE KIT)  15 mL Mouth Rinse BID   Chlorhexidine Gluconate Cloth  6 each Topical Daily   dextrose  1 ampule Intravenous Once   docusate  100 mg Per Tube BID   folic acid  1 mg Per Tube Daily   insulin aspart  0-9 Units Subcutaneous Q4H   ipratropium-albuterol  3 mL Nebulization Q6H   lidocaine  1 patch Transdermal Q24H   mouth rinse  15 mL Mouth Rinse 10 times per day   mirtazapine  45 mg Per Tube QHS   multivitamin with minerals  1 tablet Per Tube Daily   nicotine  21 mg Transdermal Daily   pantoprazole (PROTONIX) IV  40 mg Intravenous Daily   polyethylene glycol  17 g Per Tube Daily   sertraline  150 mg Per Tube Daily   thiamine  100 mg Per Tube Daily   vancomycin variable dose per unstable renal function (pharmacist dosing)   Does not apply See admin instructions   Continuous Infusions:  sodium chloride 250 mL (02/05/2021 1122)   ceFEPime (MAXIPIME) IV 2 g (01/23/2021 1124)   fentaNYL infusion INTRAVENOUS 100 mcg/hr (2021/02/17 0348)   norepinephrine (LEVOPHED) Adult infusion 80 mcg/min (02-17-21 0806)   phenylephrine (NEO-SYNEPHRINE) Adult infusion 360 mcg/min (Feb 17, 2021 0704)   propofol  (DIPRIVAN) infusion 15 mcg/kg/min (2021/02/17 0348)   sodium bicarbonate 150 mEq in D5W infusion 125 mL/hr at 02/17/21 0751   sodium chloride (hypertonic) 50 mL/hr at 02/17/2021 0523   PRN Meds:.acetaminophen, acetaminophen, fentaNYL (SUBLIMAZE) injection, fentaNYL (SUBLIMAZE) injection, ondansetron (ZOFRAN) IV   Critical care time:  45 minutes    Multidisciplinary rounds were performed with the ICU team.  I have conferred with the patient's 2 sons and husband at bedside as well as the patient's sister-in-law.  They want to proceed with withdrawal of support later today but begin comfort measures.  Renold Don, MD Advanced Bronchoscopy PCCM [facility]   *This note was dictated using voice recognition software/Dragon.  Despite best efforts to proofread, errors can occur which can change the meaning.  Any change was purely unintentional.

## 2021-02-17 NOTE — Progress Notes (Signed)
[facility] Cardiology  SUBJECTIVE: Patient intubated, obtunded   Vitals:   02/11/2021 0300 01/28/2021 0308 02/14/2021 0400 01/29/2021 0500  BP: 107/65  (!) 83/39 (!) 84/59  Pulse:      Resp: (!) 24  (!) 24 (!) 24  Temp: (!) 95.72 F (35.4 C)  (!) 95.36 F (35.2 C) (!) 95.72 F (35.4 C)  TempSrc:   Esophageal   SpO2:  93% 90%   Weight:    65 kg  Height:         Intake/Output Summary (Last 24 hours) at 02/02/2021 0838 Last data filed at 02/07/2021 0400 Gross per 24 hour  Intake 4207.05 ml  Output 200 ml  Net 4007.05 ml      PHYSICAL EXAM  General: Intubated, obtunded HEENT: Intubated Neck:  No JVD.  Lungs: Clear bilaterally to auscultation and percussion. Heart: HRRR . Normal S1 and S2 without gallops or murmurs.  Abdomen: Bowel sounds are positive, abdomen soft and non-tender  Msk:  Back normal, normal gait. Normal strength and tone for age. Extremities: No clubbing, cyanosis or edema.   Neuro: Obtunded Psych: Obtunded   LABS: Basic Metabolic Panel: Recent Labs    01/28/2021 1730 02/06/2021 2119 02/07/2021 0323 01/19/2021 0658  NA 143   < > 148* 148*  K 4.5  --  4.7  --   CL 116*  --  122*  --   CO2 17*  --  12*  --   GLUCOSE 110*  --  156*  --   BUN 54*  --  55*  --   CREATININE 2.13*  --  2.44*  --   CALCIUM 7.8*  --  6.8*  --   MG  --   --  2.1  --   PHOS  --   --  6.8*  --    < > = values in this interval not displayed.   Liver Function Tests: Recent Labs    01/27/2021 0509  AST 180*  ALT 24  ALKPHOS 192*  BILITOT 1.7*  PROT 5.2*  ALBUMIN 1.9*   No results for input(s): LIPASE, AMYLASE in the last 72 hours. CBC: Recent Labs    02/08/21 0823 01/31/2021 0509 01/21/2021 1730 01/19/2021 0323  WBC 35.9* 34.0* 41.9* 36.2*  NEUTROABS 31.6* 30.2*  --   --   HGB 14.2 13.3 12.4 11.4*  HCT 38.8 36.4 34.6* 31.9*  MCV 86.8 85.4 87.2 89.9  PLT 165 PLATELET CLUMPS NOTED ON SMEAR, UNABLE TO ESTIMATE 26* 20*   Cardiac Enzymes: No results for input(s): CKTOTAL, CKMB,  CKMBINDEX, TROPONINI in the last 72 hours. BNP: Invalid input(s): POCBNP D-Dimer: Recent Labs    02/14/2021 1730 01/25/2021 0323  DDIMER >20.00* >20.00*   Hemoglobin A1C: No results for input(s): HGBA1C in the last 72 hours. Fasting Lipid Panel: Recent Labs    02/15/2021 1030 02/13/2021 0323  CHOL 53  --   HDL 12*  --   LDLCALC 14  --   TRIG 137 153*  CHOLHDL 4.4  --    Thyroid Function Tests: Recent Labs    01/31/2021 1030  TSH 2.249   Anemia Panel: No results for input(s): VITAMINB12, FOLATE, FERRITIN, TIBC, IRON, RETICCTPCT in the last 72 hours.  DG Chest 1 View  Result Date: 02/08/2021 CLINICAL DATA:  Central line placement EXAM: CHEST  1 VIEW COMPARISON:  Chest radiograph from earlier today. FINDINGS: Endotracheal tube tip is 1.2 cm above the carina. Enteric tube enters stomach with the tip not seen on this  image. Right subclavian central venous catheter terminates at the cavoatrial junction. Stable cardiomediastinal silhouette with normal heart size. No pneumothorax. No pleural effusion. Hazy diffuse parahilar lung opacities, improved. IMPRESSION: 1. Endotracheal tube tip 1.2 cm above the carina, consider retracting 1 cm. 2. Right subclavian central venous catheter terminates at the cavoatrial junction. No pneumothorax. 3. Improved hazy diffuse parahilar lung opacities, compatible with improving pulmonary edema. These results will be called to the ordering clinician or representative by the Radiologist Assistant, and communication documented in the PACS or [employer]. Electronically Signed   By: Delbert Phenix M.D.   On: 24-Feb-2021 09:53   DG Abd 1 View  Result Date: 02/24/2021 CLINICAL DATA:  Intubation EXAM: ABDOMEN - 1 VIEW COMPARISON:  02/16/2021 abdominal CT FINDINGS: The enteric tube tip and side-port reaches the stomach. Diffuse gas and stool distended colon, also seen on prior but progressed. No concerning mass effect or calcification. IMPRESSION: 1. Expected positioning  of the enteric tube. 2. Diffuse gas and stool distended colon which has progressed from admission CT, question colonic ileus. Electronically Signed   By: Marnee Spring M.D.   On: 02-24-2021 05:13   CT HEAD WO CONTRAST  Result Date: 24-Feb-2021 CLINICAL DATA:  Mental status change with unknown cause EXAM: CT HEAD WITHOUT CONTRAST TECHNIQUE: Contiguous axial images were obtained from the base of the skull through the vertex without intravenous contrast. COMPARISON:  None. FINDINGS: Brain: Low-density in the upper left cerebellum. Suspect cytotoxic edema in the left occipital lobe, although affected by streak artifact. No hemorrhage, hydrocephalus, or collection. No masslike findings. Vascular: Asymmetric density of the left PCA, especially on coronal reformats. Left PCA may be fetal type. Skull: Negative Sinuses/Orbits: Negative for acute finding. Chronic left maxillary sinus inflammation with sclerotic wall thickening, mucosal thickening, and atelectasis. Optic drusen. Other: A page has been placed to the ordering provider. IMPRESSION: Findings of acute left superior cerebellar and occipital infarcts. The left PCA appears hyperdense and may be thrombosed. Consider CTA. Electronically Signed   By: Marnee Spring M.D.   On: 02-24-21 05:34   US Venous Img Lower Bilateral (DVT)  Result Date: 02-24-21 CLINICAL DATA:  Cerebral infarction, low fibrinogen. EXAM: BILATERAL LOWER EXTREMITY VENOUS DOPPLER ULTRASOUND TECHNIQUE: Gray-scale sonography with graded compression, as well as color Doppler and duplex ultrasound were performed to evaluate the lower extremity deep venous systems from the level of the common femoral vein and including the common femoral, femoral, profunda femoral, popliteal and calf veins including the posterior tibial, peroneal and gastrocnemius veins when visible. The superficial great saphenous vein was also interrogated. Spectral Doppler was utilized to evaluate flow at rest and with  distal augmentation maneuvers in the common femoral, femoral and popliteal veins. COMPARISON:  None. FINDINGS: RIGHT LOWER EXTREMITY Common Femoral Vein: No evidence of thrombus. Normal compressibility, respiratory phasicity and response to augmentation. Saphenofemoral Junction: No evidence of thrombus. Normal compressibility and flow on color Doppler imaging. Profunda Femoral Vein: No evidence of thrombus. Normal compressibility and flow on color Doppler imaging. Femoral Vein: No evidence of thrombus. Normal compressibility, respiratory phasicity and response to augmentation. Popliteal Vein: No evidence of thrombus. Normal compressibility, respiratory phasicity and response to augmentation. Calf Veins: No evidence of thrombus. Normal compressibility and flow on color Doppler imaging. Superficial Great Saphenous Vein: No evidence of thrombus. Normal compressibility. Venous Reflux:  None. Other Findings: No evidence of superficial thrombophlebitis or abnormal fluid collection. LEFT LOWER EXTREMITY Common Femoral Vein: No evidence of thrombus. Normal compressibility, respiratory phasicity and response  to augmentation. Saphenofemoral Junction: No evidence of thrombus. Normal compressibility and flow on color Doppler imaging. Profunda Femoral Vein: No evidence of thrombus. Normal compressibility and flow on color Doppler imaging. Femoral Vein: No evidence of thrombus. Normal compressibility, respiratory phasicity and response to augmentation. Popliteal Vein: No evidence of thrombus. Normal compressibility, respiratory phasicity and response to augmentation. Calf Veins: No evidence of thrombus. Normal compressibility and flow on color Doppler imaging. Superficial Great Saphenous Vein: No evidence of thrombus. Normal compressibility. Venous Reflux:  None. Other Findings: No evidence of superficial thrombophlebitis or abnormal fluid collection. IMPRESSION: No evidence of deep venous thrombosis in either lower extremity.  Electronically Signed   By: Irish Lack M.D.   On: 01/21/2021 14:24   CT CEREBRAL PERFUSION W CONTRAST  Result Date: 02/16/2021 CLINICAL DATA:  Stroke workup EXAM: CT PERFUSION BRAIN TECHNIQUE: Multiphase CT imaging of the brain was performed following IV bolus contrast injection. Subsequent parametric perfusion maps were calculated using RAPID software. CONTRAST:  50mL OMNIPAQUE IOHEXOL 350 MG/ML SOLN COMPARISON:  CT and CTA from earlier today FINDINGS: CT Brain Perfusion Findings: CBF (<30%) Volume: Perfusion (Tmax>6.0s) volume: Mismatch Volume: 20mL ASPECTS on noncontrast CT Head: 5 performed at the same time Infarct Core: 317 mL Infarction Location:Left cerebral hemisphere and right parietal temporal cortex. There is additional acute infarction in the right ACA territory not detected on this scan. These results were called by telephone at the time of interpretation on 01/24/2021 at 9:08 am to provider Amada Jupiter, who is already aware IMPRESSION: Left hemispheric and right parietal temporal acute infarcts. Electronically Signed   By: Marnee Spring M.D.   On: 01/28/2021 09:08   Portable Chest x-ray  Result Date: 02/08/2021 CLINICAL DATA:  Intubation EXAM: PORTABLE CHEST 1 VIEW COMPARISON:  02/11/2021 FINDINGS: Endotracheal tube with tip approximately 2 cm above the carina. The enteric tube tip and side-port reaches the stomach. Interstitial and hazy airspace type opacity bilaterally, not convincingly changed. No visible effusion or air leak. Normal heart size. Extensive artifact from EKG leads. IMPRESSION: New hardware in unremarkable position. Unchanged bilateral infiltrates. Electronically Signed   By: Marnee Spring M.D.   On: 01/24/2021 05:12   DG Chest Port 1 View  Result Date: 01/31/2021 CLINICAL DATA:  Difficulty breathing EXAM: PORTABLE CHEST 1 VIEW COMPARISON:  CT 02/22/2021, radiograph 02/08/2021 FINDINGS: Increasing hazy interstitial and patchy opacities throughout the  lungs, particular within the left upper lung when compared to most recent prior imaging. No visible pneumothorax or layering pleural effusion. Stable cardiomediastinal contours with a calcified aorta. No acute or worrisome osseous or soft tissue abnormality of the chest wall. IMPRESSION: Increasing hazy interstitial opacities throughout the lungs concerning for worsening pneumonia and/or developing edema. Aortic Atherosclerosis (ICD10-I70.0). Electronically Signed   By: Kreg Shropshire M.D.   On: 02/15/2021 03:46   ECHOCARDIOGRAM COMPLETE  Result Date: 01/29/2021    ECHOCARDIOGRAM REPORT   Patient Name:   Amy Becker Date of Exam: 02/09/2021 Medical Rec #:  [id number]       Height:       61.0 in Accession #:    [id number]      Weight:       135.0 lb Date of Birth:  [birth year]        BSA:          1.598 m Patient Age:    70 years        BP:           102/59 mmHg Patient  Gender: F               HR:           103 bpm. Exam Location:  [facility] Procedure: 2D Echo and Intracardiac Opacification Agent Indications:     Acute Myocardial Infarction  History:         Patient has no prior history of Echocardiogram examinations.                  Risk Factors:Hypertension and Current Smoker.  Sonographer:     L Thornton-Maynard Referring Phys:  [id number] Lyn Hollingshead Oluwatobi Ruppe Diagnosing Phys: Marcina Millard MD  Sonographer Comments: Echo performed with patient supine and on artificial respirator. Suboptimal images due to patient's smoking history. IMPRESSIONS  1. Left ventricular ejection fraction, by estimation, is 50 to 55%. The left ventricle has low normal function. The left ventricle demonstrates regional wall motion abnormalities (see scoring diagram/findings for description). Left ventricular diastolic  parameters are consistent with Grade I diastolic dysfunction (impaired relaxation).  2. Right ventricular systolic function is normal. The right ventricular size is normal. There is moderately elevated pulmonary artery  systolic pressure.  3. The mitral valve is normal in structure. Moderate mitral valve regurgitation. No evidence of mitral stenosis.  4. Tricuspid valve regurgitation is moderate.  5. The aortic valve is normal in structure. Aortic valve regurgitation is not visualized. No aortic stenosis is present.  6. The inferior vena cava is normal in size with greater than 50% respiratory variability, suggesting right atrial pressure of 3 mmHg. FINDINGS  Left Ventricle: Left ventricular ejection fraction, by estimation, is 50 to 55%. The left ventricle has low normal function. The left ventricle demonstrates regional wall motion abnormalities. Definity contrast agent was given IV to delineate the left ventricular endocardial borders. The left ventricular internal cavity size was normal in size. There is no left ventricular hypertrophy. Left ventricular diastolic parameters are consistent with Grade I diastolic dysfunction (impaired relaxation).  LV Wall Scoring: The apical inferior segment and apex are akinetic. Right Ventricle: The right ventricular size is normal. No increase in right ventricular wall thickness. Right ventricular systolic function is normal. There is moderately elevated pulmonary artery systolic pressure. The tricuspid regurgitant velocity is 3.33 m/s, and with an assumed right atrial pressure of 3 mmHg, the estimated right ventricular systolic pressure is 47.4 mmHg. Left Atrium: Left atrial size was normal in size. Right Atrium: Right atrial size was normal in size. Pericardium: There is no evidence of pericardial effusion. Mitral Valve: The mitral valve is normal in structure. Moderate mitral valve regurgitation. No evidence of mitral valve stenosis. Tricuspid Valve: The tricuspid valve is normal in structure. Tricuspid valve regurgitation is moderate . No evidence of tricuspid stenosis. Aortic Valve: The aortic valve is normal in structure. Aortic valve regurgitation is not visualized. Aortic  regurgitation PHT measures 335 msec. No aortic stenosis is present. Aortic valve mean gradient measures 4.0 mmHg. Aortic valve peak gradient measures 7.6 mmHg. Aortic valve area, by VTI measures 2.08 cm. Pulmonic Valve: The pulmonic valve was normal in structure. Pulmonic valve regurgitation is not visualized. No evidence of pulmonic stenosis. Aorta: The aortic root is normal in size and structure. Venous: The inferior vena cava is normal in size with greater than 50% respiratory variability, suggesting right atrial pressure of 3 mmHg. IAS/Shunts: No atrial level shunt detected by color flow Doppler.  LEFT VENTRICLE PLAX 2D LVIDd:         3.30 cm     Diastology LVIDs:  2.11 cm     LV e' medial:    3.94 cm/s LV PW:         1.02 cm     LV E/e' medial:  19.3 LV IVS:        1.37 cm     LV e' lateral:   6.48 cm/s LVOT diam:     1.90 cm     LV E/e' lateral: 11.8 LV SV:         36 LV SV Index:   22 LVOT Area:     2.84 cm  LV Volumes (MOD) LV vol d, MOD A2C: 47.7 ml LV vol d, MOD A4C: 45.8 ml LV vol s, MOD A2C: 25.4 ml LV vol s, MOD A4C: 21.4 ml LV SV MOD A2C:     22.3 ml LV SV MOD A4C:     45.8 ml LV SV MOD BP:      23.7 ml RIGHT VENTRICLE RV S prime:     13.20 cm/s TAPSE (M-mode): 1.8 cm LEFT ATRIUM           Index       RIGHT ATRIUM           Index LA diam:      2.10 cm 1.31 cm/m  RA Area:     14.90 cm LA Vol (A2C): 18.2 ml 11.39 ml/m RA Volume:   44.50 ml  27.84 ml/m LA Vol (A4C): 20.7 ml 12.95 ml/m  AORTIC VALVE AV Area (Vmax):    2.24 cm AV Area (Vmean):   1.92 cm AV Area (VTI):     2.08 cm AV Vmax:           138.00 cm/s AV Vmean:          98.500 cm/s AV VTI:            0.172 m AV Peak Grad:      7.6 mmHg AV Mean Grad:      4.0 mmHg LVOT Vmax:         109.00 cm/s LVOT Vmean:        66.800 cm/s LVOT VTI:          0.126 m LVOT/AV VTI ratio: 0.73 AI PHT:            335 msec  AORTA Ao Root diam: 3.70 cm Ao Asc diam:  3.00 cm MITRAL VALVE                TRICUSPID VALVE MV Area (PHT): 3.99 cm     TR Peak  grad:   44.4 mmHg MV Decel Time: 190 msec     TR Vmax:        333.00 cm/s MV E velocity: 76.20 cm/s MV A velocity: 111.00 cm/s  SHUNTS MV E/A ratio:  0.69         Systemic VTI:  0.13 m                             Systemic Diam: 1.90 cm Marcina MillardAlexander Myosha Cuadras MD Electronically signed by Marcina MillardAlexander Alicja Everitt MD Signature Date/Time: 02/03/2021/1:53:28 PM    Final    CT HEAD CODE STROKE WO CONTRAST  Result Date: 02/05/2021 CLINICAL DATA:  Code stroke.  Carotid occlusion EXAM: CT HEAD WITHOUT CONTRAST TECHNIQUE: Contiguous axial images were obtained from the base of the skull through the vertex without intravenous contrast. COMPARISON:  CT from earlier today FINDINGS: Brain: Progressive extent of left PCA  territory infarction with cytotoxic edema. Left superior cerebellar infarction. Moderate area of cytotoxic edema in the right occipital parietal cortex with 2 areas in the parasagittal right frontal lobe which is ACA distribution. Patchy cytotoxic edema suggested along the left anterior and middle cerebral territory. The basal ganglia appear spared on both sides. No hemorrhage or hydrocephalus Vascular: High-density vessels from recent IV contrast Skull: Negative Sinuses/Orbits: Negative Other: These results were called by telephone at the time of interpretation on 02/12/2021 at 9:06 am to provider MCNEILL [facility] , who is already aware. ASPECTS [facility] Stroke Program Early CT Score) -on the left - Ganglionic level infarction (caudate, lentiform nuclei, internal capsule, insula, M1-M3 cortex): 3 - Supraganglionic infarction (M4-M6 cortex): 2 Total score (0-10 with 10 being normal): 5 IMPRESSION: 1. 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 2. Patchy acute right cerebral cortex infarcts. Left superior cerebellar infarction. 3. No hemorrhage or shift. Electronically Signed   By: Marnee Spring M.D.   On: 01/17/2021 09:06   [place] EKG SITE RITE  Result Date:  02/08/2021 If Site Rite image not attached, placement could not be confirmed due to current cardiac rhythm.  CT ANGIO HEAD NECK W WO CM W PERF (CODE STROKE)  Result Date: 01/28/2021 CLINICAL DATA:  Stroke. EXAM: CT ANGIOGRAPHY HEAD AND NECK TECHNIQUE: Multidetector CT imaging of the head and neck was performed using the standard protocol during bolus administration of intravenous contrast. Multiplanar CT image reconstructions and MIPs were obtained to evaluate the vascular anatomy. Carotid stenosis measurements (when applicable) are obtained utilizing NASCET criteria, using the distal internal carotid diameter as the denominator. CONTRAST:  44mL OMNIPAQUE IOHEXOL 350 MG/ML SOLN COMPARISON:  Head CT from earlier today FINDINGS: CTA NECK FINDINGS Aortic arch: Advanced atherosclerosis. Four vessel branching with the left vertebral artery arising directly from the arch. Right carotid system: Mild atheromatous plaque at the bifurcation. No stenosis or dissection. No convincing ICA beading when allowing for streak artifact. Left carotid system: Atheromatous plaque at the common carotid ostium and thickening the common carotid wall. There is graded loss and underfilling of the left distal common carotid related to occlusion beginning at the ICA origin and continuing intracranially. Vertebral arteries: No proximal subclavian flow limiting stenosis. The left vertebral artery is non dominant and arises from the arch. Both vertebral arteries are widely patent to the dura and show no visible dissection or beading. Skeleton: No acute finding. Other neck: No acute finding.  Chronic left maxillary sinusitis. Upper chest: Tracheal and esophageal intubation. Hazy opacity of the bilateral lungs which also shows underlying emphysema. Review of the MIP images confirms the above findings CTA HEAD FINDINGS Anterior circulation: The left ICA occlusion continues intracranially with no enhancing left MCA or ACA vessels. The left PCA is  fetal type and also shows no functional enhancement. There is atheromatous plaque along the right carotid siphon which is non flow reducing. No right-sided branch occlusion is seen. Posterior circulation: Right dominant vertebral artery. The vertebral and basilar arteries are diffusely patent. Fetal type left PCA with occlusion. Fetal type right PCA without stenosis. Venous sinuses: Unremarkable for the arterial phase. Anatomic variants: As above Review of the MIP images confirms the above findings Critical Value/emergent results were called by telephone at the time of interpretation on 02/13/2021 at 7:25 am to provider BRITTON RUST-CHESTER , who verbally acknowledged these results. IMPRESSION: 1. Diffuse left ICA occlusion. Incomplete circle-of-Willis and fetal type left PCA with no enhancing left ACA, MCA, or PCA  branches. 2. Atherosclerosis without flow limiting stenosis of other major vessels. 3. Bilateral ground-glass airspace disease superimposed on emphysema. There is history of pneumonia. Electronically Signed   By: Marnee Spring M.D.   On: 01/25/2021 07:25     Echo LVEF 50 to 55% with apical hypokinesis  TELEMETRY: Sinus rhythm:  ASSESSMENT AND PLAN:  Principal Problem:   Pneumonia of both lungs due to infectious organism Active Problems:   Hyponatremia   ETOH abuse   Tobacco abuse   Severe sepsis (HCC)   Hypokalemia   Hypertension   Visual floaters   Diarrhea   Thrombocytopenia (HCC)   Depression with anxiety   Joint pain   Elevated troponin   HLD (hyperlipidemia)   Acute metabolic encephalopathy   Alcohol withdrawal syndrome, with delirium (HCC)   Streptococcus agalactiae infection   Acute respiratory failure with hypoxia (HCC)   Ischemic stroke diagnosed during current admission (HCC)   ST elevation myocardial infarction (STEMI) of inferior wall, initial episode of care (HCC)    1.   Inferolateral STEMI, in patient on mechanical ventilator for aspiration and multifocal  pneumonia, experiencing acute massive CVA with left PCA thrombosis and diffuse left ICA occlusion, with high likelihood of hemorrhagic conversion if anticoagulated for cardiac catheterization and PCI.  Cardiac catheterization and emergent PCI deferred.  Patient treated conservatively.  2D echocardiogram revealed preserved left ventricular function with mild apical hypokinesis with estimated LVEF 50 to 55%.  Inferolateral STEMI in and of itself likely survivable.  Unfortunately, patient with massive left hemispheric CVA in the setting of DIC with extremely poor prognosis. 2.  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  Aspiration / multifocal pneumonia 4.  DIC   Recommendations   1.  Agree with overall current therapy 2.  Defer further cardiac diagnostics at this time 3.  No further recommendations at this time  Sign off for now, please call if any questions     Marcina Millard, MD, PhD, [facility] 01/17/2021 8:38 AM

## 2021-02-17 NOTE — Progress Notes (Signed)
Nutrition Brief Note  Chart reviewed. Pt at end of life, MD states in rounds she has transitioned to comfort.  No nutrition interventions planned at this time.  Please re-consult as needed.   Greig Castilla, RD, LDN Clinical Dietitian Pager on Amion

## 2021-02-17 NOTE — Progress Notes (Signed)
SLP Cancellation Note  Patient Details Name: Amy Becker MRN: [id number] DOB: Dec 23, 1950   Cancelled treatment:       Reason Eval/Treat Not Completed: Patient not medically ready. Per charting, pt intubated over the weekend and transferred to ICU. SLP to s/o. Please reconsult when medically appropriate.  Rondel Baton, [place], CCC-SLP Speech-Language Pathologist    Arlana Lindau 02-14-2021, 8:24 AM

## 2021-02-17 NOTE — Death Summary Note (Signed)
DEATH SUMMARY   Patient Details  Name: Amy Becker MRN: [id number] DOB: Apr 17, 1951  Admission/Discharge Information   Admit Date:  03/01/21  Date of Death: 03-07-21  Time of Death: 1040 hrs., pronounced at 1042 hrs.  Length of Stay: 6  Referring Physician: Orson Aloe, MD   Reason(s) for Hospitalization  Sepsis in the setting of community-acquired pneumonia  Diagnoses  Preliminary cause of death:  Secondary Diagnoses (including complications and co-morbidities):  Principal Problem:   Acute respiratory failure with hypoxia (HCC) Active Problems:   Acute metabolic encephalopathy   Alcohol withdrawal syndrome, with delirium (HCC)   Ischemic stroke diagnosed during current admission (HCC)   ST elevation myocardial infarction (STEMI) of inferior wall, initial episode of care (HCC)   Hyponatremia   ETOH abuse   Tobacco abuse   Pneumonia of both lungs due to infectious organism   Severe sepsis (HCC)   Hypokalemia   Hypertension   Diarrhea   Thrombocytopenia (HCC)   Depression with anxiety   Elevated troponin   HLD (hyperlipidemia)   Streptococcus agalactiae infection   Brief Hospital Course (including significant findings, care, treatment, and services provided and events leading to death)  Amy Becker is a 70 y.o. year old female who presented to [facility] ED from home via EMS on Mar 01, 2021 with complaints of shoulder pain, worsening cough and diarrhea over the last 2 weeks.  Per ED documentation and the patient's husband she complained first of right shoulder, upper back & hip pain.  This was followed with a worsening cough and diarrhea.  The patient initially refused to seek medical treatment and deteriorated to the point of being a unable to get in and out of bed.  Eventually she relented to come to the ER for evaluation. ED course: Initial vitals: Afebrile at 97.8, RR 20, HR 79, hypertensive at 195/109 & SPO2 91% on room air. Significant labs: X-ray right  shoulder-negative for fracture or dislocation, CT chest+ LUL pneumonia, CT abdomen/pelvis + colonic distention and proximal fecal matter without evidence of obstruction.  Hyponatremia-130, hypokalemia-2.9, non- AG metabolic acidosis-serum CO2 18, Cl- 97, direct bilirubin-0.4, indirect bilirubin-1.5, total bilirubin-1.9, troponin-67 > 69, PCT 1.74, leukocytosis at 15, lactic acidosis of 2.2> 1.6 Patient was admitted to MedSurg bed by [facility] service for treatment of sepsis related to CAP.   Hospital course: Patient received IV fluid resuscitation and empiric antibiotics for CAP treatment.  She received electrolyte replacement for derangements, and CIWA protocol for withdrawal symptoms.  Concern for septic arthritis vs gouty arthritis, infectious disease consulted.  Patient's blood cultures & urine culture positive for group B strep and chest x-ray showing bilateral infiltrates. Antibiotics broadened to ceftriaxone/azithromycin & Flagyl.  Per nursing report patient's mentation has been slowly declining during hospitalization.  Overnight on 02/08/2021-03/06/2021, patient had episodes of agitation and restlessness.  She received a small dose of IV Ativan per CIWA protocol.  Nursing then noticed that the patient to be tachypneic with gurgled breathing.  TRH upon bedside assessment concerned with patient's work of breathing and somnolence, PCCM called bedside to assess patient.  Patient not responsive to verbal stimulation, tachypneic with audible secretions in airway.  Respiratory therapy deep suctioned copious thick secretions without improvement.  Highly suspicious of acute aspiration as the patient is unable to protect her airway. Decision was made to emergently transfer to ICU for rapid sequence intubation and mechanical ventilation.    The patient's mentation was of concern and she underwent evaluation for potential CNS injury with CT scan  of the head.  In addition EKG and troponins were consistent with acute STEMI  however because of the patient's hemodynamic instability she was not a candidate for acute cardiac intervention.  CT scan of the head showed acute massive left hemispheric and right parietal temporal acute infarcts.  Further imaging showed left superior cerebellar and occipital infarcts left PCA without functional enhancement and diffuse left ICA occlusion with no enhancing left MCA or ACA.  Patient continued to deteriorate despite mechanical ventilation.  Family gathered and desired to transition the patient to comfort measures.  The patient expired prior to being extubated to comfort.  Family was in attendance.  Pertinent Labs and Studies  Significant Diagnostic Studies DG Chest 1 View  Result Date: 02/27/2021 CLINICAL DATA:  Central line placement EXAM: CHEST  1 VIEW COMPARISON:  Chest radiograph from earlier today. FINDINGS: Endotracheal tube tip is 1.2 cm above the carina. Enteric tube enters stomach with the tip not seen on this image. Right subclavian central venous catheter terminates at the cavoatrial junction. Stable cardiomediastinal silhouette with normal heart size. No pneumothorax. No pleural effusion. Hazy diffuse parahilar lung opacities, improved. IMPRESSION: 1. Endotracheal tube tip 1.2 cm above the carina, consider retracting 1 cm. 2. Right subclavian central venous catheter terminates at the cavoatrial junction. No pneumothorax. 3. Improved hazy diffuse parahilar lung opacities, compatible with improving pulmonary edema. These results will be called to the ordering clinician or representative by the Radiologist Assistant, and communication documented in the PACS or [employer]. Electronically Signed   By: Delbert Phenix M.D.   On: 27-Feb-2021 09:53   DG Shoulder Right  Result Date: 01/24/2021 CLINICAL DATA:  Right shoulder pain. EXAM: RIGHT SHOULDER - 2+ VIEW COMPARISON:  None. FINDINGS: Right shoulder is located without a fracture. Visualized right ribs are intact. Normal alignment  at the right [facility] joint. IMPRESSION: No acute abnormality to the right shoulder. Electronically Signed   By: Richarda Overlie M.D.   On: 01/24/2021 15:48   DG Abd 1 View  Result Date: 02/27/2021 CLINICAL DATA:  Intubation EXAM: ABDOMEN - 1 VIEW COMPARISON:  02/06/2021 abdominal CT FINDINGS: The enteric tube tip and side-port reaches the stomach. Diffuse gas and stool distended colon, also seen on prior but progressed. No concerning mass effect or calcification. IMPRESSION: 1. Expected positioning of the enteric tube. 2. Diffuse gas and stool distended colon which has progressed from admission CT, question colonic ileus. Electronically Signed   By: Marnee Spring M.D.   On: 2021-02-27 05:13   CT HEAD WO CONTRAST  Result Date: 2021/02/27 CLINICAL DATA:  Mental status change with unknown cause EXAM: CT HEAD WITHOUT CONTRAST TECHNIQUE: Contiguous axial images were obtained from the base of the skull through the vertex without intravenous contrast. COMPARISON:  None. FINDINGS: Brain: Low-density in the upper left cerebellum. Suspect cytotoxic edema in the left occipital lobe, although affected by streak artifact. No hemorrhage, hydrocephalus, or collection. No masslike findings. Vascular: Asymmetric density of the left PCA, especially on coronal reformats. Left PCA may be fetal type. Skull: Negative Sinuses/Orbits: Negative for acute finding. Chronic left maxillary sinus inflammation with sclerotic wall thickening, mucosal thickening, and atelectasis. Optic drusen. Other: A page has been placed to the ordering provider. IMPRESSION: Findings of acute left superior cerebellar and occipital infarcts. The left PCA appears hyperdense and may be thrombosed. Consider CTA. Electronically Signed   By: Marnee Spring M.D.   On: 02/27/2021 05:34   CT Angio Chest PE W and/or Wo Contrast  Result Date:  02/16/2021 CLINICAL DATA:  Nausea, diarrhea, high prob PE suspected EXAM: CT ANGIOGRAPHY CHEST CT ABDOMEN AND PELVIS WITH  CONTRAST TECHNIQUE: Multidetector CT imaging of the chest was performed using the standard protocol during bolus administration of intravenous contrast. Multiplanar CT image reconstructions and MIPs were obtained to evaluate the vascular anatomy. Multidetector CT imaging of the abdomen and pelvis was performed using the standard protocol during bolus administration of intravenous contrast. CONTRAST:  42mL OMNIPAQUE IOHEXOL 350 MG/ML SOLN COMPARISON:  None. FINDINGS: CTA CHEST FINDINGS Cardiovascular: Heart size normal. No pericardial effusion. Satisfactory opacification of pulmonary arteries noted, and there is no evidence of pulmonary emboli. Scattered coronary calcifications. Adequate contrast opacification of the thoracic aorta with no evidence of dissection, aneurysm, or stenosis. There is classic 3-vessel brachiocephalic arch anatomy without proximal stenosis. Moderate partially calcified plaque in the arch and descending thoracic segment. Mediastinum/Nodes: No mass or adenopathy. Lungs/Pleura: No significant pleural effusion. No pneumothorax. Focal infiltrate in the left upper lobe near the hilum. Subsegmental dependent atelectasis in the left lower lobe. Musculoskeletal: Anterior vertebral endplate spurring at multiple levels in the lower thoracic spine. Review of the MIP images confirms the above findings. CT ABDOMEN and PELVIS FINDINGS Hepatobiliary: No focal liver abnormality is seen. No gallstones, gallbladder wall thickening, or biliary dilatation. Pancreas: Unremarkable. No pancreatic ductal dilatation or surrounding inflammatory changes. Spleen: Normal in size without focal abnormality. Adrenals/Urinary Tract: Adrenal glands are unremarkable. Kidneys are normal, without renal calculi, focal lesion, or hydronephrosis. Bladder is distended. Stomach/Bowel: Stomach decompressed. Small bowel is nondilated. Appendix not discretely identified. Moderate proximal colonic fecal material. There is mild gaseous  distention of the transverse colon and a portion of the sigmoid segment. No significant diverticular disease nor evidence of obstruction. Vascular/Lymphatic: Moderate calcified aortoiliac plaque without aneurysm. Portal vein patent. No abdominal or pelvic adenopathy. Reproductive: Status post hysterectomy. No adnexal masses. Other: No ascites.  No free air. Musculoskeletal: No acute or significant osseous findings. Review of the MIP images confirms the above findings. IMPRESSION: 1. Negative for acute PE or thoracic aortic dissection. 2. Left upper lobe infiltrate possibly pneumonia. Followup PA and lateral chest X-ray is recommended in 3-4 weeks following trial of antibiotic therapy to ensure resolution and exclude underlying malignancy. 3. Mild colonic distension with proximal fecal material, no evidence of obstruction. Electronically Signed   By: Corlis Leak M.D.   On: 01/21/2021 14:42   CT ABDOMEN PELVIS W CONTRAST  Result Date: 01/24/2021 CLINICAL DATA:  Nausea, diarrhea, high prob PE suspected EXAM: CT ANGIOGRAPHY CHEST CT ABDOMEN AND PELVIS WITH CONTRAST TECHNIQUE: Multidetector CT imaging of the chest was performed using the standard protocol during bolus administration of intravenous contrast. Multiplanar CT image reconstructions and MIPs were obtained to evaluate the vascular anatomy. Multidetector CT imaging of the abdomen and pelvis was performed using the standard protocol during bolus administration of intravenous contrast. CONTRAST:  32mL OMNIPAQUE IOHEXOL 350 MG/ML SOLN COMPARISON:  None. FINDINGS: CTA CHEST FINDINGS Cardiovascular: Heart size normal. No pericardial effusion. Satisfactory opacification of pulmonary arteries noted, and there is no evidence of pulmonary emboli. Scattered coronary calcifications. Adequate contrast opacification of the thoracic aorta with no evidence of dissection, aneurysm, or stenosis. There is classic 3-vessel brachiocephalic arch anatomy without proximal stenosis.  Moderate partially calcified plaque in the arch and descending thoracic segment. Mediastinum/Nodes: No mass or adenopathy. Lungs/Pleura: No significant pleural effusion. No pneumothorax. Focal infiltrate in the left upper lobe near the hilum. Subsegmental dependent atelectasis in the left lower lobe. Musculoskeletal: Anterior vertebral endplate  spurring at multiple levels in the lower thoracic spine. Review of the MIP images confirms the above findings. CT ABDOMEN and PELVIS FINDINGS Hepatobiliary: No focal liver abnormality is seen. No gallstones, gallbladder wall thickening, or biliary dilatation. Pancreas: Unremarkable. No pancreatic ductal dilatation or surrounding inflammatory changes. Spleen: Normal in size without focal abnormality. Adrenals/Urinary Tract: Adrenal glands are unremarkable. Kidneys are normal, without renal calculi, focal lesion, or hydronephrosis. Bladder is distended. Stomach/Bowel: Stomach decompressed. Small bowel is nondilated. Appendix not discretely identified. Moderate proximal colonic fecal material. There is mild gaseous distention of the transverse colon and a portion of the sigmoid segment. No significant diverticular disease nor evidence of obstruction. Vascular/Lymphatic: Moderate calcified aortoiliac plaque without aneurysm. Portal vein patent. No abdominal or pelvic adenopathy. Reproductive: Status post hysterectomy. No adnexal masses. Other: No ascites.  No free air. Musculoskeletal: No acute or significant osseous findings. Review of the MIP images confirms the above findings. IMPRESSION: 1. Negative for acute PE or thoracic aortic dissection. 2. Left upper lobe infiltrate possibly pneumonia. Followup PA and lateral chest X-ray is recommended in 3-4 weeks following trial of antibiotic therapy to ensure resolution and exclude underlying malignancy. 3. Mild colonic distension with proximal fecal material, no evidence of obstruction. Electronically Signed   By: Corlis Leak M.D.    On: 2021-02-17 14:42   US Venous Img Lower Bilateral (DVT)  Result Date: 01/26/2021 CLINICAL DATA:  Cerebral infarction, low fibrinogen. EXAM: BILATERAL LOWER EXTREMITY VENOUS DOPPLER ULTRASOUND TECHNIQUE: Gray-scale sonography with graded compression, as well as color Doppler and duplex ultrasound were performed to evaluate the lower extremity deep venous systems from the level of the common femoral vein and including the common femoral, femoral, profunda femoral, popliteal and calf veins including the posterior tibial, peroneal and gastrocnemius veins when visible. The superficial great saphenous vein was also interrogated. Spectral Doppler was utilized to evaluate flow at rest and with distal augmentation maneuvers in the common femoral, femoral and popliteal veins. COMPARISON:  None. FINDINGS: RIGHT LOWER EXTREMITY Common Femoral Vein: No evidence of thrombus. Normal compressibility, respiratory phasicity and response to augmentation. Saphenofemoral Junction: No evidence of thrombus. Normal compressibility and flow on color Doppler imaging. Profunda Femoral Vein: No evidence of thrombus. Normal compressibility and flow on color Doppler imaging. Femoral Vein: No evidence of thrombus. Normal compressibility, respiratory phasicity and response to augmentation. Popliteal Vein: No evidence of thrombus. Normal compressibility, respiratory phasicity and response to augmentation. Calf Veins: No evidence of thrombus. Normal compressibility and flow on color Doppler imaging. Superficial Great Saphenous Vein: No evidence of thrombus. Normal compressibility. Venous Reflux:  None. Other Findings: No evidence of superficial thrombophlebitis or abnormal fluid collection. LEFT LOWER EXTREMITY Common Femoral Vein: No evidence of thrombus. Normal compressibility, respiratory phasicity and response to augmentation. Saphenofemoral Junction: No evidence of thrombus. Normal compressibility and flow on color Doppler imaging.  Profunda Femoral Vein: No evidence of thrombus. Normal compressibility and flow on color Doppler imaging. Femoral Vein: No evidence of thrombus. Normal compressibility, respiratory phasicity and response to augmentation. Popliteal Vein: No evidence of thrombus. Normal compressibility, respiratory phasicity and response to augmentation. Calf Veins: No evidence of thrombus. Normal compressibility and flow on color Doppler imaging. Superficial Great Saphenous Vein: No evidence of thrombus. Normal compressibility. Venous Reflux:  None. Other Findings: No evidence of superficial thrombophlebitis or abnormal fluid collection. IMPRESSION: No evidence of deep venous thrombosis in either lower extremity. Electronically Signed   By: Irish Lack M.D.   On: 01/26/2021 14:24   CT CEREBRAL PERFUSION W  CONTRAST  Result Date: 01/29/2021 CLINICAL DATA:  Stroke workup EXAM: CT PERFUSION BRAIN TECHNIQUE: Multiphase CT imaging of the brain was performed following IV bolus contrast injection. Subsequent parametric perfusion maps were calculated using RAPID software. CONTRAST:  50mL OMNIPAQUE IOHEXOL 350 MG/ML SOLN COMPARISON:  CT and CTA from earlier today FINDINGS: CT Brain Perfusion Findings: CBF (<30%) Volume: Perfusion (Tmax>6.0s) volume: Mismatch Volume: 20mL ASPECTS on noncontrast CT Head: 5 performed at the same time Infarct Core: 317 mL Infarction Location:Left cerebral hemisphere and right parietal temporal cortex. There is additional acute infarction in the right ACA territory not detected on this scan. These results were called by telephone at the time of interpretation on 02/11/2021 at 9:08 am to provider Amada Jupiter, who is already aware IMPRESSION: Left hemispheric and right parietal temporal acute infarcts. Electronically Signed   By: Marnee Spring M.D.   On: 02/11/2021 09:08   Portable Chest x-ray  Result Date: 01/22/2021 CLINICAL DATA:  Intubation EXAM: PORTABLE CHEST 1 VIEW COMPARISON:   01/31/2021 FINDINGS: Endotracheal tube with tip approximately 2 cm above the carina. The enteric tube tip and side-port reaches the stomach. Interstitial and hazy airspace type opacity bilaterally, not convincingly changed. No visible effusion or air leak. Normal heart size. Extensive artifact from EKG leads. IMPRESSION: New hardware in unremarkable position. Unchanged bilateral infiltrates. Electronically Signed   By: Marnee Spring M.D.   On: 01/18/2021 05:12   DG Chest Port 1 View  Result Date: 02/07/2021 CLINICAL DATA:  Difficulty breathing EXAM: PORTABLE CHEST 1 VIEW COMPARISON:  CT 03-03-2021, radiograph 02/08/2021 FINDINGS: Increasing hazy interstitial and patchy opacities throughout the lungs, particular within the left upper lung when compared to most recent prior imaging. No visible pneumothorax or layering pleural effusion. Stable cardiomediastinal contours with a calcified aorta. No acute or worrisome osseous or soft tissue abnormality of the chest wall. IMPRESSION: Increasing hazy interstitial opacities throughout the lungs concerning for worsening pneumonia and/or developing edema. Aortic Atherosclerosis (ICD10-I70.0). Electronically Signed   By: Kreg Shropshire M.D.   On: 02/06/2021 03:46   DG Chest Port 1 View  Result Date: 02/08/2021 CLINICAL DATA:  Sepsis, wall with dural, hypoxia EXAM: PORTABLE CHEST 1 VIEW COMPARISON:  01/26/2019 chest radiograph. 03/03/2021 chest CT angiogram. FINDINGS: Stable cardiomediastinal silhouette with top-normal heart size. No pneumothorax. No pleural effusion. Extensive patchy opacity throughout the right greater than left lungs, largely new. IMPRESSION: Extensive patchy opacity throughout the right greater than left lungs, largely new, suspicious for multilobar pneumonia. Electronically Signed   By: Delbert Phenix M.D.   On: 02/08/2021 09:36   ECHOCARDIOGRAM COMPLETE  Result Date: 01/17/2021    ECHOCARDIOGRAM REPORT   Patient Name:   Amy Becker Date of  Exam: 01/29/2021 Medical Rec #:  [id number]       Height:       61.0 in Accession #:    [id number]      Weight:       135.0 lb Date of Birth:  03-31-1951        BSA:          1.598 m Patient Age:    70 years        BP:           102/59 mmHg Patient Gender: F               HR:           103 bpm. Exam Location:  [facility] Procedure: 2D Echo and Intracardiac Opacification Agent  Indications:     Acute Myocardial Infarction  History:         Patient has no prior history of Echocardiogram examinations.                  Risk Factors:Hypertension and Current Smoker.  Sonographer:     L Thornton-Maynard Referring Phys:  [id number] Lyn HollingsheadALEXANDER PARASCHOS Diagnosing Phys: Marcina MillardAlexander Paraschos MD  Sonographer Comments: Echo performed with patient supine and on artificial respirator. Suboptimal images due to patient's smoking history. IMPRESSIONS  1. Left ventricular ejection fraction, by estimation, is 50 to 55%. The left ventricle has low normal function. The left ventricle demonstrates regional wall motion abnormalities (see scoring diagram/findings for description). Left ventricular diastolic  parameters are consistent with Grade I diastolic dysfunction (impaired relaxation).  2. Right ventricular systolic function is normal. The right ventricular size is normal. There is moderately elevated pulmonary artery systolic pressure.  3. The mitral valve is normal in structure. Moderate mitral valve regurgitation. No evidence of mitral stenosis.  4. Tricuspid valve regurgitation is moderate.  5. The aortic valve is normal in structure. Aortic valve regurgitation is not visualized. No aortic stenosis is present.  6. The inferior vena cava is normal in size with greater than 50% respiratory variability, suggesting right atrial pressure of 3 mmHg. FINDINGS  Left Ventricle: Left ventricular ejection fraction, by estimation, is 50 to 55%. The left ventricle has low normal function. The left ventricle demonstrates regional wall motion abnormalities.  Definity contrast agent was given IV to delineate the left ventricular endocardial borders. The left ventricular internal cavity size was normal in size. There is no left ventricular hypertrophy. Left ventricular diastolic parameters are consistent with Grade I diastolic dysfunction (impaired relaxation).  LV Wall Scoring: The apical inferior segment and apex are akinetic. Right Ventricle: The right ventricular size is normal. No increase in right ventricular wall thickness. Right ventricular systolic function is normal. There is moderately elevated pulmonary artery systolic pressure. The tricuspid regurgitant velocity is 3.33 m/s, and with an assumed right atrial pressure of 3 mmHg, the estimated right ventricular systolic pressure is 47.4 mmHg. Left Atrium: Left atrial size was normal in size. Right Atrium: Right atrial size was normal in size. Pericardium: There is no evidence of pericardial effusion. Mitral Valve: The mitral valve is normal in structure. Moderate mitral valve regurgitation. No evidence of mitral valve stenosis. Tricuspid Valve: The tricuspid valve is normal in structure. Tricuspid valve regurgitation is moderate . No evidence of tricuspid stenosis. Aortic Valve: The aortic valve is normal in structure. Aortic valve regurgitation is not visualized. Aortic regurgitation PHT measures 335 msec. No aortic stenosis is present. Aortic valve mean gradient measures 4.0 mmHg. Aortic valve peak gradient measures 7.6 mmHg. Aortic valve area, by VTI measures 2.08 cm. Pulmonic Valve: The pulmonic valve was normal in structure. Pulmonic valve regurgitation is not visualized. No evidence of pulmonic stenosis. Aorta: The aortic root is normal in size and structure. Venous: The inferior vena cava is normal in size with greater than 50% respiratory variability, suggesting right atrial pressure of 3 mmHg. IAS/Shunts: No atrial level shunt detected by color flow Doppler.  LEFT VENTRICLE PLAX 2D LVIDd:         3.30  cm     Diastology LVIDs:         2.11 cm     LV e' medial:    3.94 cm/s LV PW:         1.02 cm     LV E/e'  medial:  19.3 LV IVS:        1.37 cm     LV e' lateral:   6.48 cm/s LVOT diam:     1.90 cm     LV E/e' lateral: 11.8 LV SV:         36 LV SV Index:   22 LVOT Area:     2.84 cm  LV Volumes (MOD) LV vol d, MOD A2C: 47.7 ml LV vol d, MOD A4C: 45.8 ml LV vol s, MOD A2C: 25.4 ml LV vol s, MOD A4C: 21.4 ml LV SV MOD A2C:     22.3 ml LV SV MOD A4C:     45.8 ml LV SV MOD BP:      23.7 ml RIGHT VENTRICLE RV S prime:     13.20 cm/s TAPSE (M-mode): 1.8 cm LEFT ATRIUM           Index       RIGHT ATRIUM           Index LA diam:      2.10 cm 1.31 cm/m  RA Area:     14.90 cm LA Vol (A2C): 18.2 ml 11.39 ml/m RA Volume:   44.50 ml  27.84 ml/m LA Vol (A4C): 20.7 ml 12.95 ml/m  AORTIC VALVE AV Area (Vmax):    2.24 cm AV Area (Vmean):   1.92 cm AV Area (VTI):     2.08 cm AV Vmax:           138.00 cm/s AV Vmean:          98.500 cm/s AV VTI:            0.172 m AV Peak Grad:      7.6 mmHg AV Mean Grad:      4.0 mmHg LVOT Vmax:         109.00 cm/s LVOT Vmean:        66.800 cm/s LVOT VTI:          0.126 m LVOT/AV VTI ratio: 0.73 AI PHT:            335 msec  AORTA Ao Root diam: 3.70 cm Ao Asc diam:  3.00 cm MITRAL VALVE                TRICUSPID VALVE MV Area (PHT): 3.99 cm     TR Peak grad:   44.4 mmHg MV Decel Time: 190 msec     TR Vmax:        333.00 cm/s MV E velocity: 76.20 cm/s MV A velocity: 111.00 cm/s  SHUNTS MV E/A ratio:  0.69         Systemic VTI:  0.13 m                             Systemic Diam: 1.90 cm Marcina Millard MD Electronically signed by Marcina Millard MD Signature Date/Time: 02/13/2021/1:53:28 PM    Final    CT HEAD CODE STROKE WO CONTRAST  Result Date: 01/23/2021 CLINICAL DATA:  Code stroke.  Carotid occlusion EXAM: CT HEAD WITHOUT CONTRAST TECHNIQUE: Contiguous axial images were obtained from the base of the skull through the vertex without intravenous contrast. COMPARISON:  CT from earlier  today FINDINGS: Brain: Progressive extent of left PCA territory infarction with cytotoxic edema. Left superior cerebellar infarction. Moderate area of cytotoxic edema in the right occipital parietal cortex with 2 areas in the parasagittal right frontal lobe which is ACA  distribution. Patchy cytotoxic edema suggested along the left anterior and middle cerebral territory. The basal ganglia appear spared on both sides. No hemorrhage or hydrocephalus Vascular: High-density vessels from recent IV contrast Skull: Negative Sinuses/Orbits: Negative Other: These results were called by telephone at the time of interpretation on 02-14-21 at 9:06 am to provider [facility] , who is already aware. ASPECTS [facility] Stroke Program Early CT Score) -on the left - Ganglionic level infarction (caudate, lentiform nuclei, internal capsule, insula, M1-M3 cortex): 3 - Supraganglionic infarction (M4-M6 cortex): 2 Total score (0-10 with 10 being normal): 5 IMPRESSION: 1. 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 2. Patchy acute right cerebral cortex infarcts. Left superior cerebellar infarction. 3. No hemorrhage or shift. Electronically Signed   By: Marnee Spring M.D.   On: February 14, 2021 09:06   [place] EKG SITE RITE  Result Date: 02/08/2021 If Site Rite image not attached, placement could not be confirmed due to current cardiac rhythm.  CT ANGIO HEAD NECK W WO CM W PERF (CODE STROKE)  Result Date: 2021-02-14 CLINICAL DATA:  Stroke. EXAM: CT ANGIOGRAPHY HEAD AND NECK TECHNIQUE: Multidetector CT imaging of the head and neck was performed using the standard protocol during bolus administration of intravenous contrast. Multiplanar CT image reconstructions and MIPs were obtained to evaluate the vascular anatomy. Carotid stenosis measurements (when applicable) are obtained utilizing NASCET criteria, using the distal internal carotid diameter as the denominator. CONTRAST:  75mL  OMNIPAQUE IOHEXOL 350 MG/ML SOLN COMPARISON:  Head CT from earlier today FINDINGS: CTA NECK FINDINGS Aortic arch: Advanced atherosclerosis. Four vessel branching with the left vertebral artery arising directly from the arch. Right carotid system: Mild atheromatous plaque at the bifurcation. No stenosis or dissection. No convincing ICA beading when allowing for streak artifact. Left carotid system: Atheromatous plaque at the common carotid ostium and thickening the common carotid wall. There is graded loss and underfilling of the left distal common carotid related to occlusion beginning at the ICA origin and continuing intracranially. Vertebral arteries: No proximal subclavian flow limiting stenosis. The left vertebral artery is non dominant and arises from the arch. Both vertebral arteries are widely patent to the dura and show no visible dissection or beading. Skeleton: No acute finding. Other neck: No acute finding.  Chronic left maxillary sinusitis. Upper chest: Tracheal and esophageal intubation. Hazy opacity of the bilateral lungs which also shows underlying emphysema. Review of the MIP images confirms the above findings CTA HEAD FINDINGS Anterior circulation: The left ICA occlusion continues intracranially with no enhancing left MCA or ACA vessels. The left PCA is fetal type and also shows no functional enhancement. There is atheromatous plaque along the right carotid siphon which is non flow reducing. No right-sided branch occlusion is seen. Posterior circulation: Right dominant vertebral artery. The vertebral and basilar arteries are diffusely patent. Fetal type left PCA with occlusion. Fetal type right PCA without stenosis. Venous sinuses: Unremarkable for the arterial phase. Anatomic variants: As above Review of the MIP images confirms the above findings Critical Value/emergent results were called by telephone at the time of interpretation on 02-14-2021 at 7:25 am to provider BRITTON RUST-CHESTER , who  verbally acknowledged these results. IMPRESSION: 1. Diffuse left ICA occlusion. Incomplete circle-of-Willis and fetal type left PCA with no enhancing left ACA, MCA, or PCA branches. 2. Atherosclerosis without flow limiting stenosis of other major vessels. 3. Bilateral ground-glass airspace disease superimposed on emphysema. There is history of pneumonia. Electronically Signed   By: Marnee Spring  M.D.   On: 03/06/21 07:25    Microbiology Recent Results (from the past 240 hour(s))  Urine Culture     Status: Abnormal   Collection Time: 01/27/2021 10:45 AM   Specimen: Urine, Clean Catch  Result Value Ref Range Status   Specimen Description   Final    URINE, CLEAN CATCH Performed at [facility], [address] [place], [place] [address]    Special Requests   Final    NONE Performed at [facility], [address] [place], [place] [address]    Culture (A)  Final    50,000 COLONIES/mL GROUP B STREP(S.AGALACTIAE)ISOLATED TESTING AGAINST S. AGALACTIAE NOT ROUTINELY PERFORMED DUE TO PREDICTABILITY OF AMP/PEN/VAN SUSCEPTIBILITY. Performed at [facility], [address], [place], [place] [address]    Report Status 02/06/2021 FINAL  Final  Resp Panel by RT-PCR (Flu A&B, Covid) Nasopharyngeal Swab     Status: None   Collection Time: 02/08/2021  1:37 PM   Specimen: Nasopharyngeal Swab; Nasopharyngeal(NP) swabs in vial transport medium  Result Value Ref Range Status   SARS Coronavirus 2 by RT PCR NEGATIVE NEGATIVE Final    Comment: (NOTE) SARS-CoV-2 target nucleic acids are NOT DETECTED.  The SARS-CoV-2 RNA is generally detectable in upper respiratory specimens during the acute phase of infection. The lowest concentration of SARS-CoV-2 viral copies this assay can detect is 138 copies/mL. A negative result does not preclude SARS-Cov-2 infection and should not be used as the sole basis for treatment or other patient management decisions. A negative result may occur with   improper specimen collection/handling, submission of specimen other than nasopharyngeal swab, presence of viral mutation(s) within the areas targeted by this assay, and inadequate number of viral copies(<138 copies/mL). A negative result must be combined with clinical observations, patient history, and epidemiological information. The expected result is Negative.  Fact Sheet for Patients:  BloggerCourse.com  Fact Sheet for Healthcare Providers:  SeriousBroker.it  This test is no t yet approved or cleared by the [place] FDA and  has been authorized for detection and/or diagnosis of SARS-CoV-2 by FDA under an Emergency Use Authorization (EUA). This EUA will remain  in effect (meaning this test can be used) for the duration of the COVID-19 declaration under Section 564(b)(1) of the Act, 21 U.S.C.section 360bbb-3(b)(1), unless the authorization is terminated  or revoked sooner.       Influenza A by PCR NEGATIVE NEGATIVE Final   Influenza B by PCR NEGATIVE NEGATIVE Final    Comment: (NOTE) The Xpert Xpress SARS-CoV-2/FLU/RSV plus assay is intended as an aid in the diagnosis of influenza from Nasopharyngeal swab specimens and should not be used as a sole basis for treatment. Nasal washings and aspirates are unacceptable for Xpert Xpress SARS-CoV-2/FLU/RSV testing.  Fact Sheet for Patients: BloggerCourse.com  Fact Sheet for Healthcare Providers: SeriousBroker.it  This test is not yet approved or cleared by the [place] FDA and has been authorized for detection and/or diagnosis of SARS-CoV-2 by FDA under an Emergency Use Authorization (EUA). This EUA will remain in effect (meaning this test can be used) for the duration of the COVID-19 declaration under Section 564(b)(1) of the Act, 21 U.S.C. section 360bbb-3(b)(1), unless the authorization is terminated  or revoked.  Performed at [facility], [address] [place], [place] [address]   Blood culture (single)     Status: Abnormal   Collection Time: 01/22/2021  3:59 PM   Specimen: BLOOD  Result Value Ref Range Status  Specimen Description   Final    BLOOD LEFT ANTECUBITAL Performed at [facility], [address] [place], [place] [address]    Special Requests   Final    BOTTLES DRAWN AEROBIC AND ANAEROBIC Blood Culture results may not be optimal due to an inadequate volume of blood received in culture bottles Performed at [facility], [address] [place], [place] [address]    Culture  Setup Time   Final    GRAM POSITIVE COCCI IN BOTH AEROBIC AND ANAEROBIC BOTTLES CRITICAL RESULT CALLED TO, READ BACK BY AND VERIFIED WITH: JASON ROBINS  ON 02/05/21 SKL Performed at [facility], [address], [place], [place] [address]    Culture GROUP B STREP(S.AGALACTIAE)ISOLATED (A)  Final   Report Status 02/07/2021 FINAL  Final   Organism ID, Bacteria GROUP B STREP(S.AGALACTIAE)ISOLATED  Final      Susceptibility   Group b strep(s.agalactiae)isolated - MIC*    CLINDAMYCIN <=0.25 SENSITIVE Sensitive     AMPICILLIN <=0.25 SENSITIVE Sensitive     ERYTHROMYCIN <=0.12 SENSITIVE Sensitive     VANCOMYCIN 0.5 SENSITIVE Sensitive     CEFTRIAXONE <=0.12 SENSITIVE Sensitive     LEVOFLOXACIN 1 SENSITIVE Sensitive     PENICILLIN Value in next row Sensitive      SENSITIVE0.06    * GROUP B STREP(S.AGALACTIAE)ISOLATED  Blood Culture ID Panel (Reflexed)     Status: Abnormal   Collection Time: 02/13/2021  3:59 PM  Result Value Ref Range Status   Enterococcus faecalis NOT DETECTED NOT DETECTED Final   Enterococcus Faecium NOT DETECTED NOT DETECTED Final   Listeria monocytogenes NOT DETECTED NOT DETECTED Final   Staphylococcus species NOT DETECTED NOT DETECTED Final   Staphylococcus aureus (BCID) NOT DETECTED NOT DETECTED Final   Staphylococcus epidermidis NOT  DETECTED NOT DETECTED Final   Staphylococcus lugdunensis NOT DETECTED NOT DETECTED Final   Streptococcus species DETECTED (A) NOT DETECTED Final    Comment: CRITICAL RESULT CALLED TO, READ BACK BY AND VERIFIED WITH: JASON ROBINS  ON 02/05/21 SKL    Streptococcus agalactiae DETECTED (A) NOT DETECTED Final    Comment: CRITICAL RESULT CALLED TO, READ BACK BY AND VERIFIED WITH: JASON ROBINS  ON 02/05/21 SKL    Streptococcus pneumoniae NOT DETECTED NOT DETECTED Final   Streptococcus pyogenes NOT DETECTED NOT DETECTED Final   A.calcoaceticus-baumannii NOT DETECTED NOT DETECTED Final   Bacteroides fragilis NOT DETECTED NOT DETECTED Final   Enterobacterales NOT DETECTED NOT DETECTED Final   Enterobacter cloacae complex NOT DETECTED NOT DETECTED Final   Escherichia coli NOT DETECTED NOT DETECTED Final   Klebsiella aerogenes NOT DETECTED NOT DETECTED Final   Klebsiella oxytoca NOT DETECTED NOT DETECTED Final   Klebsiella pneumoniae NOT DETECTED NOT DETECTED Final   Proteus species NOT DETECTED NOT DETECTED Final   Salmonella species NOT DETECTED NOT DETECTED Final   Serratia marcescens NOT DETECTED NOT DETECTED Final   Haemophilus influenzae NOT DETECTED NOT DETECTED Final   Neisseria meningitidis NOT DETECTED NOT DETECTED Final   Pseudomonas aeruginosa NOT DETECTED NOT DETECTED Final   Stenotrophomonas maltophilia NOT DETECTED NOT DETECTED Final   Candida albicans NOT DETECTED NOT DETECTED Final   Candida auris NOT DETECTED NOT DETECTED Final   Candida glabrata NOT DETECTED NOT DETECTED Final   Candida krusei NOT DETECTED NOT DETECTED Final   Candida parapsilosis NOT DETECTED NOT DETECTED Final   Candida tropicalis NOT DETECTED NOT DETECTED Final   Cryptococcus neoformans/gattii NOT DETECTED NOT DETECTED Final  Comment: Performed at [facility], [address] [place], [place] [address]  Expectorated Sputum Assessment w Gram Stain, Rflx to Resp Cult     Status: None    Collection Time: 02/05/2021  7:15 PM   Specimen: SPU  Result Value Ref Range Status   Specimen Description SPUTUM  Final   Special Requests NONE  Final   Sputum evaluation   Final    THIS SPECIMEN IS ACCEPTABLE FOR SPUTUM CULTURE Performed at [facility], [address] [place], [place] [address]    Report Status 02/05/2021 FINAL  Final  Culture, Respiratory w Gram Stain     Status: None   Collection Time: 02/05/21  7:15 PM   Specimen: SPU  Result Value Ref Range Status   Specimen Description   Final    SPUTUM Performed at [facility], [address] [place], [place] [address]    Special Requests   Final    NONE Reflexed from [id number] Performed at [facility], [address] [place], [place] [address]    Gram Stain   Final    FEW WBC PRESENT, PREDOMINANTLY MONONUCLEAR NO ORGANISMS SEEN    Culture   Final    RARE Normal respiratory flora-no Staph aureus or Pseudomonas seen Performed at [facility], [address], [place], [place] [address]    Report Status 02/08/2021 FINAL  Final  CULTURE, BLOOD (ROUTINE X 2) w Reflex to ID Panel     Status: None (Preliminary result)   Collection Time: 02/06/21  1:00 AM   Specimen: BLOOD RIGHT HAND  Result Value Ref Range Status   Specimen Description BLOOD RIGHT HAND  Final   Special Requests   Final    BOTTLES DRAWN AEROBIC AND ANAEROBIC Blood Culture adequate volume   Culture   Final    NO GROWTH 4 DAYS Performed at [facility], [address], [place], [place] [address]    Report Status PENDING  Incomplete  CULTURE, BLOOD (ROUTINE X 2) w Reflex to ID Panel     Status: None (Preliminary result)   Collection Time: 02/06/21  1:01 AM   Specimen: BLOOD LEFT HAND  Result Value Ref Range Status   Specimen Description BLOOD LEFT HAND  Final   Special Requests   Final    BOTTLES DRAWN AEROBIC AND ANAEROBIC Blood Culture results may not be optimal due to an inadequate volume of blood  received in culture bottles   Culture   Final    NO GROWTH 4 DAYS Performed at [facility], [address] [place], [place] [address]    Report Status PENDING  Incomplete  MRSA Next Gen by PCR, Nasal     Status: None   Collection Time: 02/08/21  3:00 PM   Specimen: Nasal Mucosa; Nasal Swab  Result Value Ref Range Status   MRSA by PCR Next Gen NOT DETECTED NOT DETECTED Final    Comment: (NOTE) The GeneXpert MRSA Assay (FDA approved for NASAL specimens only), is one component of a comprehensive MRSA colonization surveillance program. It is not intended to diagnose MRSA infection nor to guide or monitor treatment for MRSA infections. Test performance is not FDA approved in patients less than 71 years old. Performed at [facility], [address] [place], [place] [address]   Culture, Respiratory w Gram Stain     Status: None (Preliminary result)   Collection Time: 01/31/2021  5:36 AM   Specimen: Tracheal Aspirate; Respiratory  Result  Value Ref Range Status   Specimen Description   Final    TRACHEAL ASPIRATE Performed at [facility], [address] [place], [place] [address]    Special Requests   Final    NONE Performed at [facility], [address] [place], [place] [address]    Gram Stain   Final    FEW WBC PRESENT,BOTH PMN AND MONONUCLEAR RARE GRAM VARIABLE ROD    Culture   Final    CULTURE REINCUBATED FOR BETTER GROWTH Performed at [facility], [address] [place], [place] [address]    Report Status PENDING  Incomplete  MRSA Next Gen by PCR, Nasal     Status: None   Collection Time: 02/05/2021  6:29 AM  Result Value Ref Range Status   MRSA by PCR Next Gen NOT DETECTED NOT DETECTED Final    Comment: (NOTE) The GeneXpert MRSA Assay (FDA approved for NASAL specimens only), is one component of a comprehensive MRSA colonization surveillance program. It is not intended to diagnose MRSA infection nor to guide or monitor  treatment for MRSA infections. Test performance is not FDA approved in patients less than 68 years old. Performed at [facility], [address] [place], [place] [address]   CULTURE, BLOOD (ROUTINE X 2) w Reflex to ID Panel     Status: None (Preliminary result)   Collection Time: 02/08/2021 10:47 AM   Specimen: BLOOD  Result Value Ref Range Status   Specimen Description BLOOD RIGHT ANTECUBITAL  Final   Special Requests   Final    BOTTLES DRAWN AEROBIC ONLY Blood Culture adequate volume   Culture   Final    NO GROWTH < 24 HOURS Performed at [facility], [address] [place], [place] [address]    Report Status PENDING  Incomplete  CULTURE, BLOOD (ROUTINE X 2) w Reflex to ID Panel     Status: None (Preliminary result)   Collection Time: 01/26/2021 10:51 AM   Specimen: BLOOD  Result Value Ref Range Status   Specimen Description BLOOD RIGHT [facility]  Final   Special Requests   Final    BOTTLES DRAWN AEROBIC ONLY Blood Culture adequate volume   Culture   Final    NO GROWTH < 24 HOURS Performed at [facility], [address] [place], [place] [address]    Report Status PENDING  Incomplete    Lab Basic Metabolic Panel: Recent Labs  Lab 01/22/2021 1440 02/07/2021 1759 02/05/21 0436 02/05/21 1143 02/07/21 0418 02/07/21 2118 02/08/21 5784 01/20/2021 0509 01/25/2021 1730 02/05/2021 2119 01/27/2021 2232 01/25/2021 2333 02/11/2021 0323 Feb 11, 2021 0658  NA 130*  --  132* 133* 142  --  146* 148* 143 143 143 147* 148* 148*  K 2.9*  --  3.8 3.4* 2.9* 2.9* 2.9* 4.3 4.5  --   --   --  4.7  --   CL 97*  --  99 104 116*  --  113* 118* 116*  --   --   --  122*  --   CO2 18*  --  18* 18* 17*  --  21* 16* 17*  --   --   --  12*  --   GLUCOSE 80  --  73 104* 103*  --  111* 114* 110*  --   --   --  156*  --   BUN 20  --  20 21 21   --  16 34* 54*  --   --   --  55*  --   CREATININE 0.66  --  0.54 0.56 0.61  --  0.57 1.45* 2.13*  --   --   --  2.44*  --   CALCIUM 8.8*  --  8.6*  8.5* 8.7*  --  9.2 8.6* 7.8*  --   --   --  6.8*  --   MG 1.7  --  2.2 1.9 2.0  --   --   --   --   --   --   --  2.1  --   PHOS  --  3.7  --  3.4  --   --   --   --   --   --   --   --  6.8*  --    Liver Function Tests: Recent Labs  Lab 01/26/2021 1440 02/05/21 1143 Feb 19, 2021 0509  AST 34 36 180*  ALT 11 12 24   ALKPHOS 101 76 192*  BILITOT 1.9* 1.5* 1.7*  PROT 6.0* 5.0* 5.2*  ALBUMIN 2.6* 2.1* 1.9*   No results for input(s): LIPASE, AMYLASE in the last 168 hours. No results for input(s): AMMONIA in the last 168 hours. CBC: Recent Labs  Lab 02/07/21 0418 02/08/21 0823 2021/02/19 0509 Feb 19, 2021 1730 01/29/2021 0323  WBC 31.6* 35.9* 34.0* 41.9* 36.2*  NEUTROABS  --  31.6* 30.2*  --   --   HGB 13.2 14.2 13.3 12.4 11.4*  HCT 36.2 38.8 36.4 34.6* 31.9*  MCV 85.2 86.8 85.4 87.2 89.9  PLT 124* 165 PLATELET CLUMPS NOTED ON SMEAR, UNABLE TO ESTIMATE 26* 20*   Cardiac Enzymes: No results for input(s): CKTOTAL, CKMB, CKMBINDEX, TROPONINI in the last 168 hours. Sepsis Labs: Recent Labs  Lab 01/17/2021 1440 02/01/2021 1622 02/08/21 0823 February 19, 2021 0509 2021-02-19 0800 02/19/2021 1156 02-19-2021 1321 2021-02-19 1730 01/25/2021 0323  PROCALCITON 1.74  --   --  0.89  --   --   --   --   --   WBC  --    < > 35.9* 34.0*  --   --   --  41.9* 36.2*  LATICACIDVEN  --    < >  --  6.0* 3.5* 2.9* 3.2*  --   --    < > = values in this interval not displayed.    Procedures/Operations   Intubation and mechanical ventilation 7/24 Central line placement 7/24 Arterial line placement 7/24  C. Danice Goltz, MD Advanced Bronchoscopy PCCM [facility]  02/05/2021, 11:22 AM

## 2021-02-17 DEATH — deceased

## 2021-04-19 IMAGING — DX PORTABLE CHEST - 1 VIEW
1 series · 1 of 1 positions shown · non-contrast
Comparison: None.

CLINICAL DATA: Cough. Hyponatremia.

EXAM:
PORTABLE CHEST 1 VIEW

[chest ap]
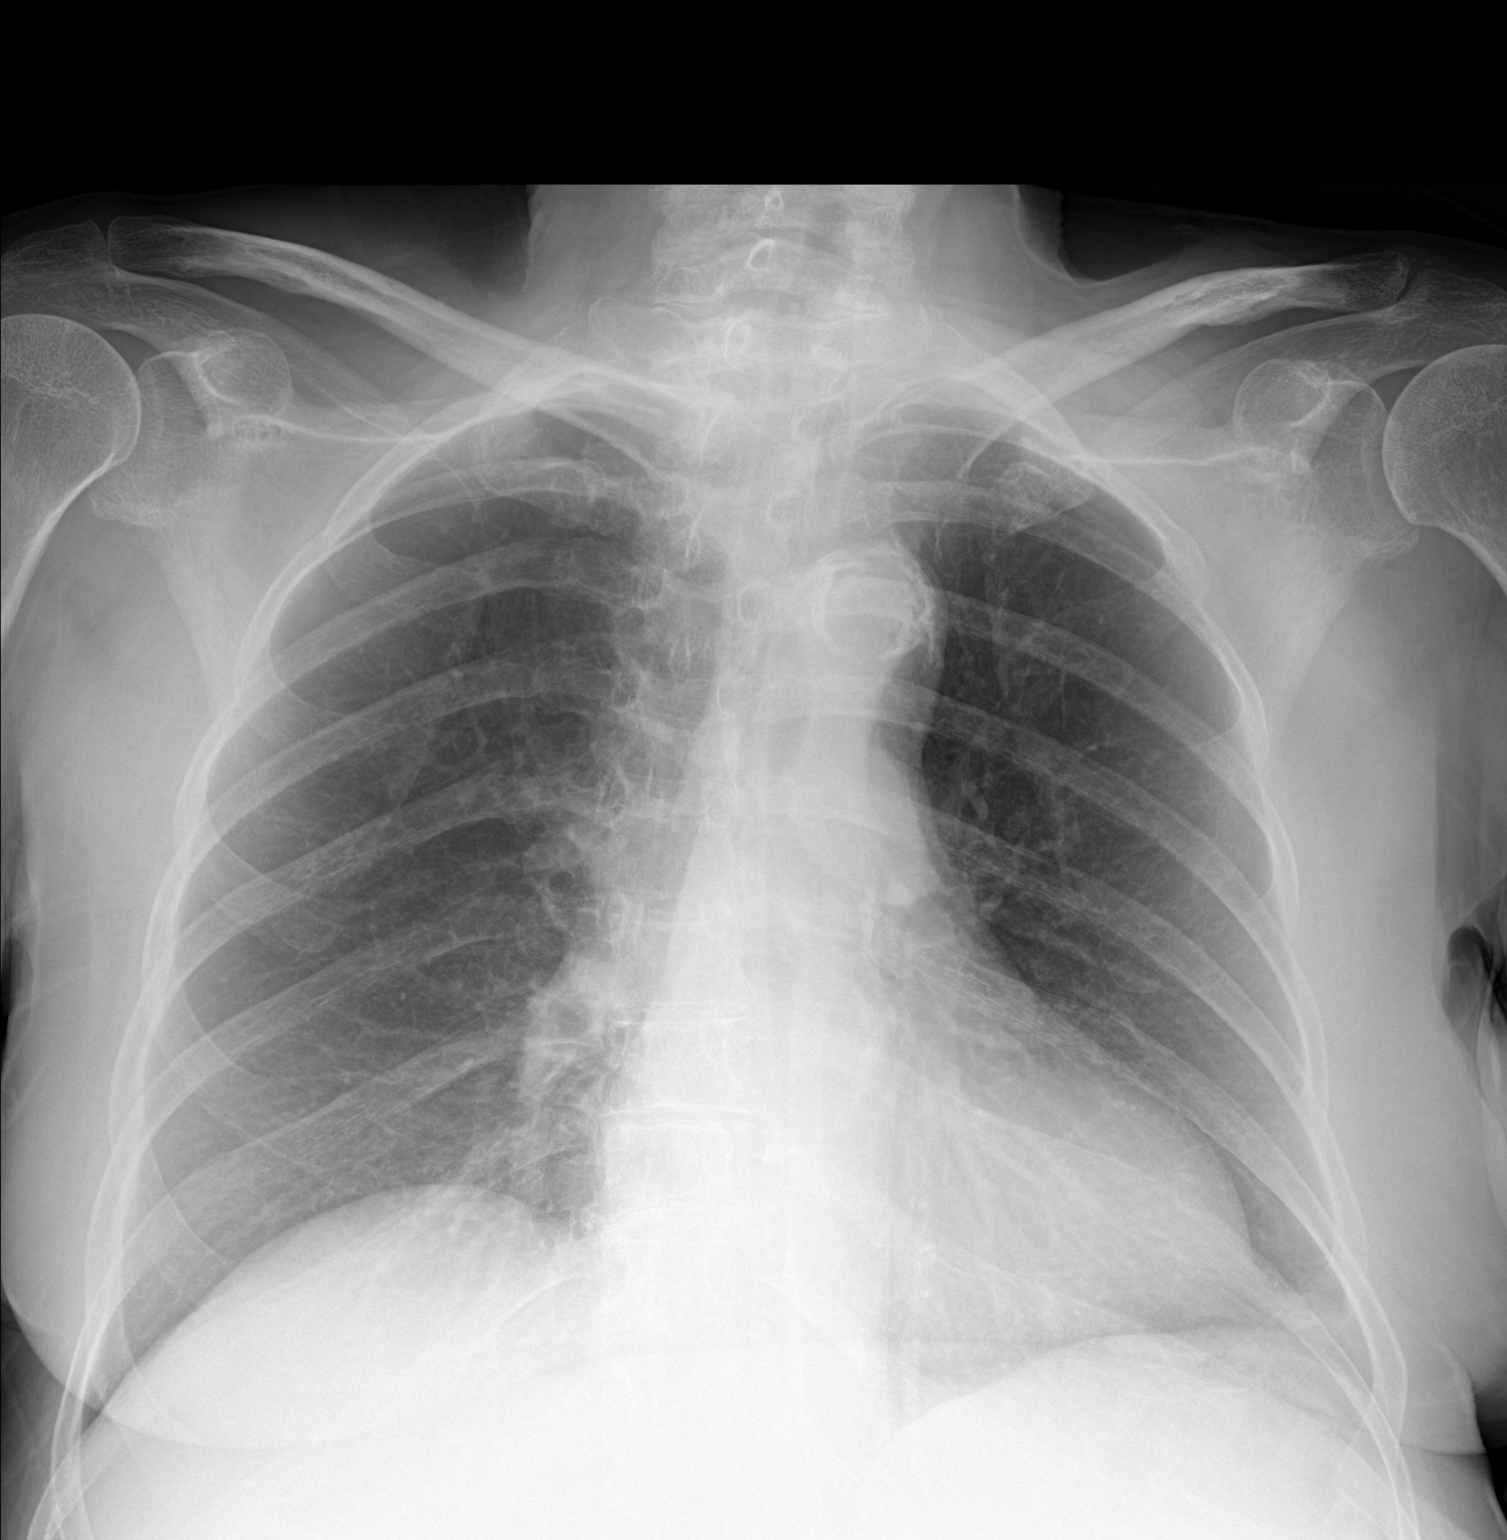

[1 of 1 positions shown; findings below may reference images not displayed]

FINDINGS: Mild rotation. Heart size upper normal with normal mediastinal
contours. Aortic atherosclerosis. Pulmonary vasculature is normal.
No consolidation, pleural effusion, or pneumothorax. Skin fold
projects over the left upper chest wall. No acute osseous
abnormalities are seen.
IMPRESSION: No acute chest findings.

Aortic Atherosclerosis ([id number]).

## 2023-05-04 IMAGING — CT CT HEAD CODE STROKE
3 series · 15 of 47 positions shown, 18 images · non-contrast
Comparison: CT from earlier today

CLINICAL DATA: Code stroke.  Carotid occlusion

EXAM:
CT HEAD WITHOUT CONTRAST
TECHNIQUE: Contiguous axial images were obtained from the base of the skull
through the vertex without intravenous contrast.

[Series 2: head wo · axial · 0.44mm/px · z∈[-209,-69]mm · 9 of 34 slices shown, 12 images]
[im 3/34  brain]
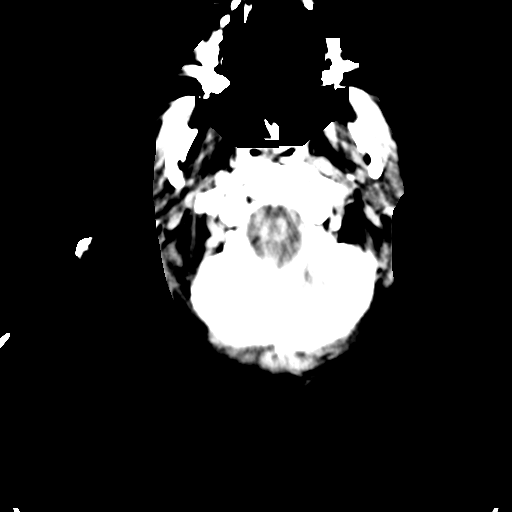
[im 3/34  bone]
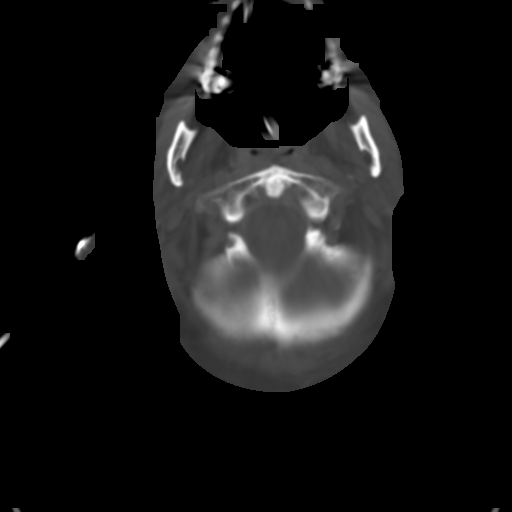
[im 6/34  brain]
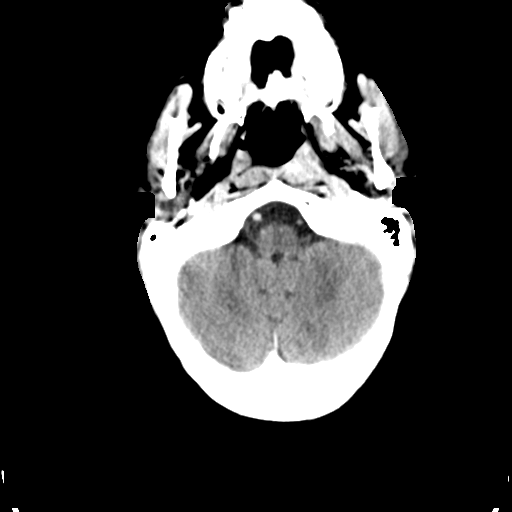
[im 10/34  brain]
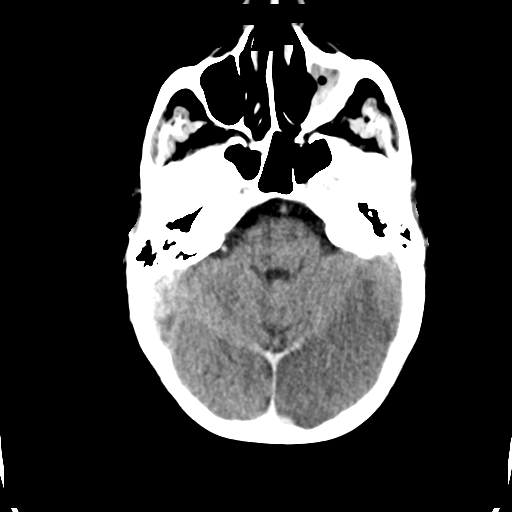
[im 13/34  brain]
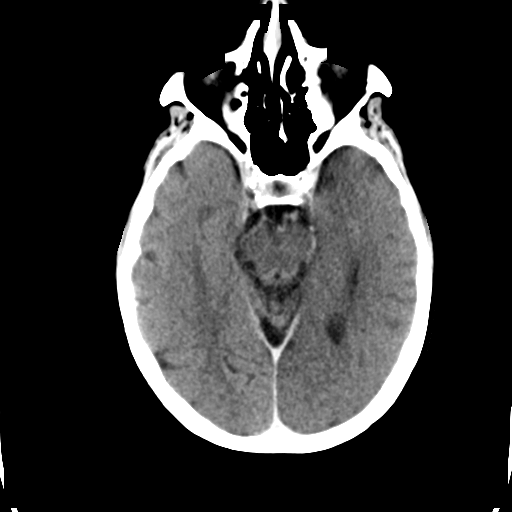
[im 18/34  brain]
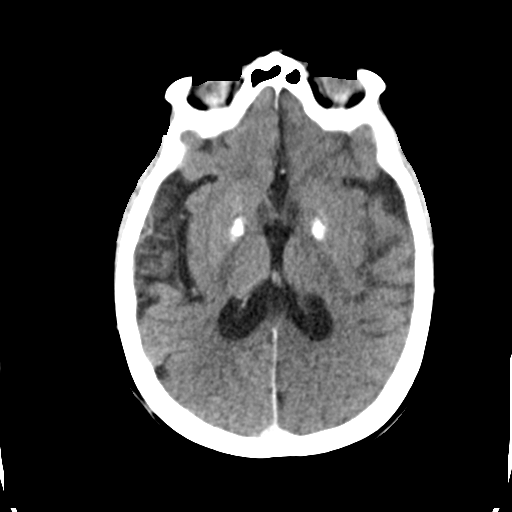
[im 18/34  bone]
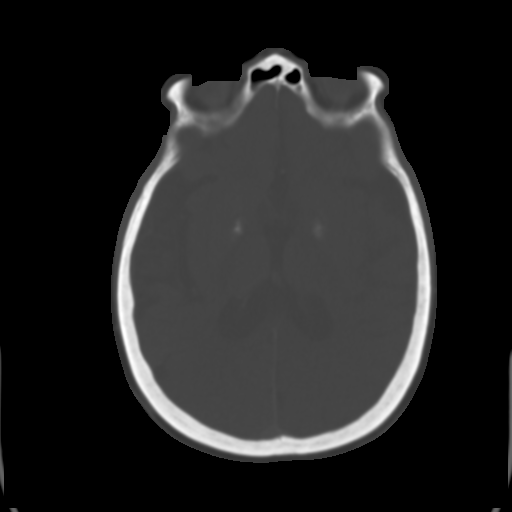
[im 21/34  brain]
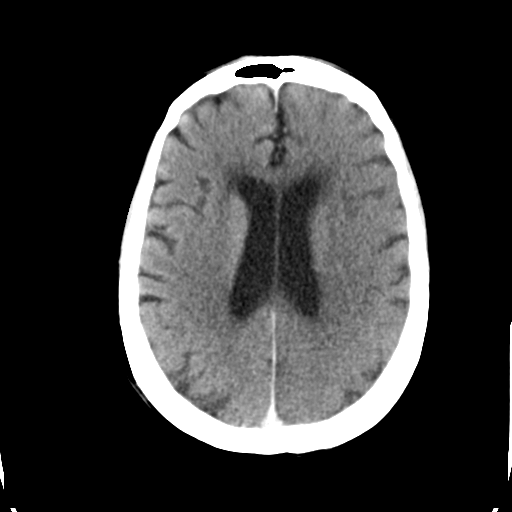
[im 24/34  brain]
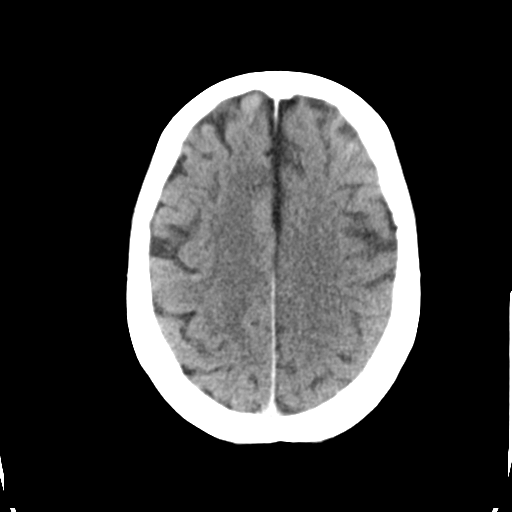
[im 28/34  brain]
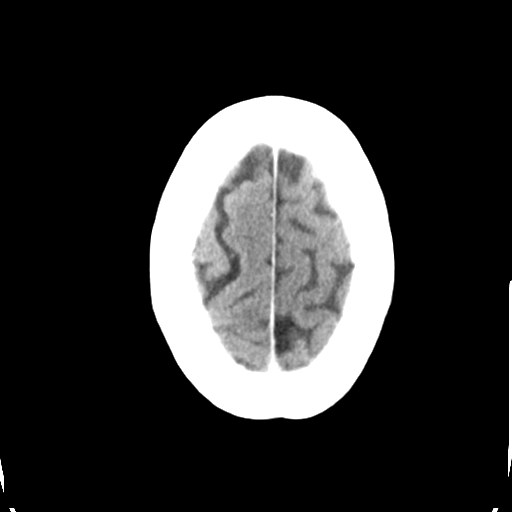
[im 31/34  brain]
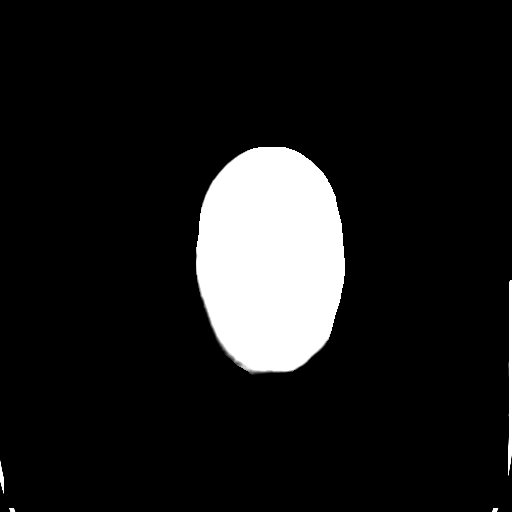
[im 31/34  bone]
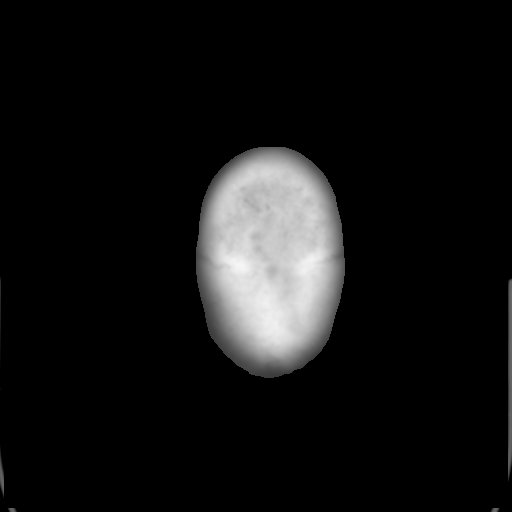

[Series 4: coronal soft tissue · coronal · 0.32mm/px · 3 of 70 slices shown]
[im 25/70  brain]
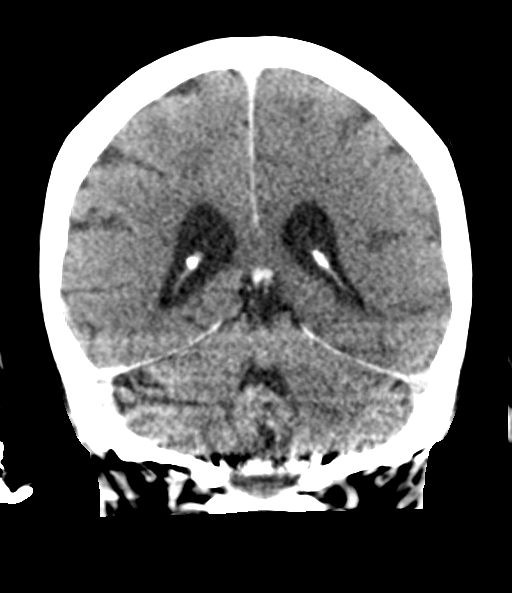
[im 32/70  brain]
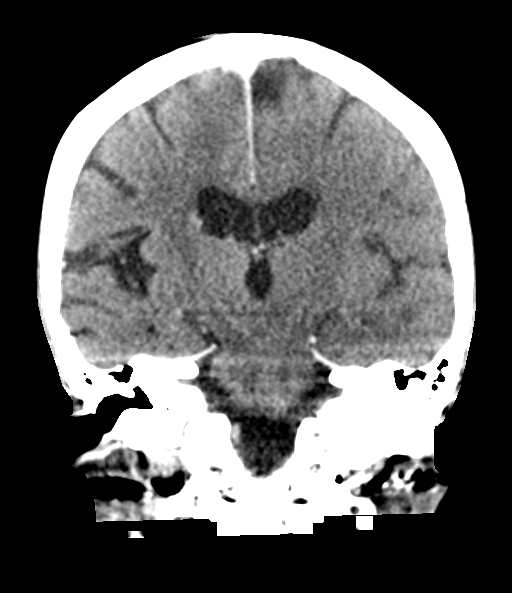
[im 38/70  brain]
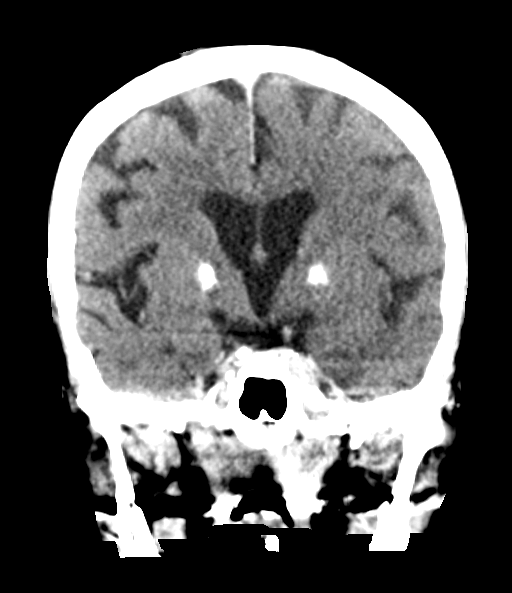

[Series 5: sagittal soft tissue · sagittal · 0.36mm/px · 3 of 55 slices shown]
[im 19/55  brain]
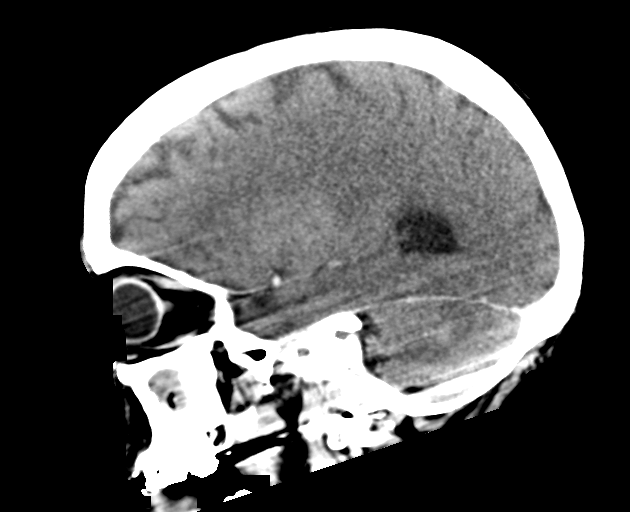
[im 28/55  brain]
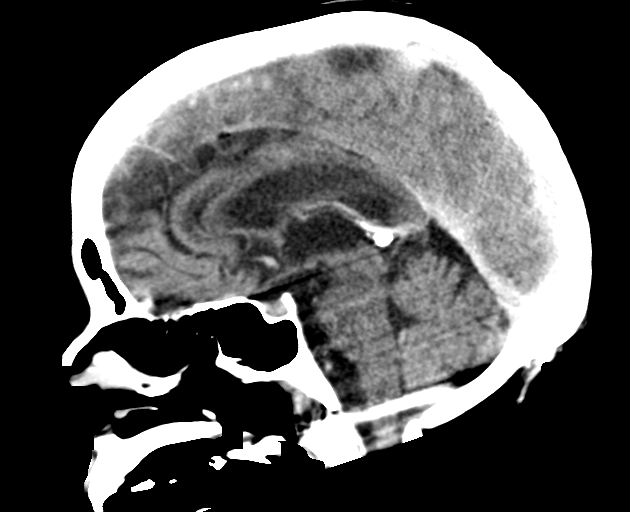
[im 37/55  brain]
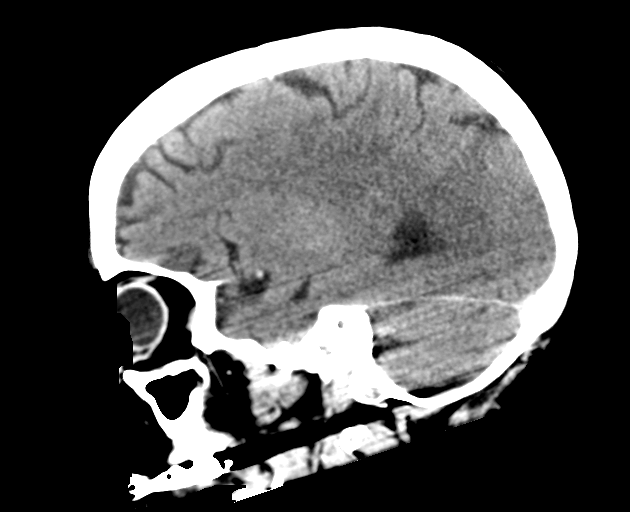

[15 of 47 positions shown; findings below may reference images not displayed]

FINDINGS: Brain: Progressive extent of left PCA territory infarction with
cytotoxic edema. Left superior cerebellar infarction.

Moderate area of cytotoxic edema in the right occipital parietal
cortex with 2 areas in the parasagittal right frontal lobe which is
ACA distribution.

Patchy cytotoxic edema suggested along the left anterior and middle
cerebral territory. The basal ganglia appear spared on both sides.

No hemorrhage or hydrocephalus

Vascular: High-density vessels from recent IV contrast

Skull: Negative

Sinuses/Orbits: Negative

Other: These results were called by telephone at the time of
interpretation on 02/09/2021 at [DATE] to provider ODEIAN
LUAX , who is already aware.

ASPECTS ([place] Stroke Program Early CT Score) -on the left

- Ganglionic level infarction (caudate, lentiform nuclei, internal
capsule, insula, M1-M3 cortex): 3

- Supraganglionic infarction (M4-M6 cortex): 2

Total score (0-10 with 10 being normal): 5
IMPRESSION: 1. Extensive cortical infarction in the left cerebral hemisphere,
affecting all 3 distributions.
2. Patchy acute right cerebral cortex infarcts. Left superior
cerebellar infarction.
3. No hemorrhage or shift.
